# Patient Record
Sex: Female | Born: 1950 | Race: White | Hispanic: No | Smoking: Current some day smoker
Health system: Southern US, Community
[De-identification: ages and names within clinical notes are randomized; demographics above are authoritative.]

## PROBLEM LIST (undated history)

## (undated) DIAGNOSIS — M1990 Unspecified osteoarthritis, unspecified site: Secondary | ICD-10-CM

## (undated) DIAGNOSIS — Z800 Family history of malignant neoplasm of digestive organs: Secondary | ICD-10-CM

## (undated) DIAGNOSIS — F32A Depression, unspecified: Secondary | ICD-10-CM

## (undated) DIAGNOSIS — K219 Gastro-esophageal reflux disease without esophagitis: Secondary | ICD-10-CM

## (undated) DIAGNOSIS — R112 Nausea with vomiting, unspecified: Secondary | ICD-10-CM

## (undated) DIAGNOSIS — I10 Essential (primary) hypertension: Secondary | ICD-10-CM

## (undated) DIAGNOSIS — E785 Hyperlipidemia, unspecified: Secondary | ICD-10-CM

## (undated) DIAGNOSIS — Z808 Family history of malignant neoplasm of other organs or systems: Secondary | ICD-10-CM

## (undated) DIAGNOSIS — C439 Malignant melanoma of skin, unspecified: Secondary | ICD-10-CM

## (undated) DIAGNOSIS — F419 Anxiety disorder, unspecified: Secondary | ICD-10-CM

## (undated) DIAGNOSIS — J449 Chronic obstructive pulmonary disease, unspecified: Secondary | ICD-10-CM

## (undated) DIAGNOSIS — Z8489 Family history of other specified conditions: Secondary | ICD-10-CM

## (undated) DIAGNOSIS — E119 Type 2 diabetes mellitus without complications: Secondary | ICD-10-CM

## (undated) DIAGNOSIS — T8859XA Other complications of anesthesia, initial encounter: Secondary | ICD-10-CM

## (undated) DIAGNOSIS — F329 Major depressive disorder, single episode, unspecified: Secondary | ICD-10-CM

## (undated) DIAGNOSIS — J45909 Unspecified asthma, uncomplicated: Secondary | ICD-10-CM

## (undated) DIAGNOSIS — Z923 Personal history of irradiation: Secondary | ICD-10-CM

## (undated) DIAGNOSIS — R569 Unspecified convulsions: Secondary | ICD-10-CM

## (undated) DIAGNOSIS — Z9889 Other specified postprocedural states: Secondary | ICD-10-CM

## (undated) HISTORY — DX: Depression, unspecified: F32.A

## (undated) HISTORY — DX: Major depressive disorder, single episode, unspecified: F32.9

## (undated) HISTORY — DX: Hyperlipidemia, unspecified: E78.5

## (undated) HISTORY — DX: Malignant melanoma of skin, unspecified: C43.9

## (undated) HISTORY — PX: TUBAL LIGATION: SHX77

## (undated) HISTORY — DX: Essential (primary) hypertension: I10

## (undated) HISTORY — DX: Family history of malignant neoplasm of digestive organs: Z80.0

## (undated) HISTORY — DX: Anxiety disorder, unspecified: F41.9

## (undated) HISTORY — DX: Family history of malignant neoplasm of other organs or systems: Z80.8

---

## 2001-12-18 ENCOUNTER — Encounter: Payer: Self-pay | Admitting: Internal Medicine

## 2001-12-18 ENCOUNTER — Ambulatory Visit (HOSPITAL_COMMUNITY): Admission: RE | Admit: 2001-12-18 | Discharge: 2001-12-18 | Payer: Self-pay | Admitting: Internal Medicine

## 2007-08-18 ENCOUNTER — Ambulatory Visit (HOSPITAL_COMMUNITY): Admission: RE | Admit: 2007-08-18 | Discharge: 2007-08-18 | Payer: Self-pay | Admitting: Internal Medicine

## 2007-10-04 ENCOUNTER — Ambulatory Visit (HOSPITAL_COMMUNITY): Admission: RE | Admit: 2007-10-04 | Discharge: 2007-10-04 | Payer: Self-pay | Admitting: General Surgery

## 2007-10-09 ENCOUNTER — Encounter: Payer: Self-pay | Admitting: Obstetrics and Gynecology

## 2007-10-09 ENCOUNTER — Ambulatory Visit (HOSPITAL_COMMUNITY): Admission: RE | Admit: 2007-10-09 | Discharge: 2007-10-09 | Payer: Self-pay | Admitting: Obstetrics and Gynecology

## 2010-06-02 NOTE — Op Note (Signed)
NAMECHARITIE, Melissa Weaver              ACCOUNT NO.:  [id number]   MEDICAL RECORD NO.:  [id number]          PATIENT TYPE:  AMB   LOCATION:  DAY                           FACILITY:  [facility]   PHYSICIAN:  Tilda Burrow, M.D. DATE OF BIRTH:  [birth year]   DATE OF PROCEDURE:  10/09/2007  DATE OF DISCHARGE:                               OPERATIVE REPORT   PREOPERATIVE DIAGNOSES:  Vulvar carcinoma in situ, cervical polyp.   POSTOPERATIVE DIAGNOSES:  Vulvar carcinoma in situ, cervical polyp.   PROCEDURES:  Left partial vulvectomy, cervical polypectomy.   SURGEON:  Tilda Burrow, MD   ASSISTANTAmie Critchley, CST.   ANESTHESIA:  General with LMA.   COMPLICATIONS:  Some throat irritation as the patient was recently  recovering from an upper respiratory infection, otherwise uneventful  postop status.   DETAILS OF THE PROCEDURE:  The patient was taken to the operating room,  prepped and draped in standard fashion after time-out completed with the  lesion on the left labia majora easily demarcated.  The specimen was  infiltrated subcutaneously with dilute Marcaine with epinephrine  solution and then #15 blade used to circumscribe the specimen leaving  about a 5-mm margin of visually normal tissue all the way around the  lesion which encompassed the bulk of the left labia majora and the inner  aspects of the left labia minora.  The specimen was excised intact in  one block and tagged 12 o'clock for histologic orientation.  The subcu  tissues were inspected and hemostasis achieved with point cautery as  necessary.  It was necessary to undermine the skin edges on the side  next to the vagina as well as the side nearest the left thigh in order  to lead to a tension-free tissue edge approximation which was  accomplished with a series of interrupted 3-0 chromic sutures.  Skin  edges pulled together nicely and hemostasis was considered adequate.  The cervix was then visualized with speculum in place  and the small  polyp growing on the posterior lip of the cervix was trimmed and sent  off as a separate specimen.  The patient was allowed  to waken during the cleansing of the perineum after the procedure to  remove Betadine.  There was some oozing.  Ice pack was applied.  The  patient went to recovery room in good condition, will be allowed to go  home today, given prescription for Vicodin x25 tablets p.r.n.  discomfort.  Ice pack and routine wound care will be advised.      Tilda Burrow, M.D.  Electronically Signed     JVF/MEDQ  D:  10/09/2007  T:  10/10/2007  Job:  [id number]   cc:   Madelin Rear. Sherwood Gambler, MD  Fax: [phone]   Mertha Finders., M.D.  Fax: [phone]

## 2010-06-02 NOTE — H&P (Signed)
Melissa Weaver, DESHMUKH              ACCOUNT NO.:  [id number]   MEDICAL RECORD NO.:  [id number]          PATIENT TYPE:  AMB   LOCATION:  DAY                           FACILITY:  [facility]   PHYSICIAN:  Dalia Heading, M.D.  DATE OF BIRTH:  1950-05-29   DATE OF ADMISSION:  DATE OF DISCHARGE:  LH                              HISTORY & PHYSICAL   CHIEF COMPLAINT:  Need for screening colonoscopy.   HISTORY OF PRESENT ILLNESS:  The patient is a 60 year old white female  who is referred for endoscopic evaluation for which she needs  colonoscopy for screening purposes.  No abdominal pain, weight loss,  nausea, vomiting, diarrhea, constipation, melena, or hematochezia have  been noted.  She has never had a colonoscopy.  There is no family  history of colon carcinoma.   PAST MEDICAL HISTORY:  Hypertension.   PAST SURGICAL HISTORY:  GYN surgery.   CURRENT MEDICATIONS:  Enalapril, calcium supplements, fish oil.   ALLERGIES:  No known drug allergies.   REVIEW OF SYSTEMS:  The patient smokes a pack of cigarettes a day.  She  denies any significant alcohol use.  She denies any other cardiac or  pulmonary difficulties or bleeding disorders.   PHYSICAL EXAMINATION:  GENERAL :  The patient is a well-developed, well-  nourished white female in no acute distress.  LUNGS:  Clear to auscultation with equal breath sounds bilaterally.  HEART:  Regular rate and rhythm with no S3, S4, or murmurs.  ABDOMEN:  Soft, nontender, nondistended.  No hepatosplenomegaly or  masses noted.  RECTAL:  Deferred due to the procedure.   IMPRESSION:  Need for screening colonoscopy.   PLAN:  The patient is scheduled for a colonoscopy on October 04, 2007.  The risks and benefits of the procedure including bleeding and  perforation were fully explained to the patient, gave informed consent.      Dalia Heading, M.D.     MAJ/MEDQ  D:  09/19/2007  T:  09/20/2007  Job:  [id number]   cc:   Madelin Rear. Melissa Gambler, MD  Fax:  [phone]

## 2010-06-02 NOTE — H&P (Signed)
Melissa Weaver, Melissa Weaver              ACCOUNT NO.:  [id number]   MEDICAL RECORD NO.:  [id number]          PATIENT TYPE:  AMB   LOCATION:  DAY                           FACILITY:  [facility]   PHYSICIAN:  Dalia Heading, M.D.  DATE OF BIRTH:  1950-03-06   DATE OF ADMISSION:  DATE OF DISCHARGE:  LH                              HISTORY & PHYSICAL   CHIEF COMPLAINT:  Need for screening colonoscopy.   HISTORY OF PRESENT ILLNESS:  The patient is a 60 year old white female  who is referred for endoscopic evaluation for which she needs  colonoscopy for screening purposes.  No abdominal pain, weight loss,  nausea, vomiting, diarrhea, constipation, melena, or hematochezia have  been noted.  She has never had a colonoscopy.  There is no family  history of colon carcinoma.   PAST MEDICAL HISTORY:  Hypertension.   PAST SURGICAL HISTORY:  GYN surgery.   CURRENT MEDICATIONS:  Enalapril, calcium supplements, fish oil.   ALLERGIES:  No known drug allergies.   REVIEW OF SYSTEMS:  The patient smokes a pack of cigarettes a day.  She  denies any significant alcohol use.  She denies any other cardiac or  pulmonary difficulties or bleeding disorders.   PHYSICAL EXAMINATION:  GENERAL :  The patient is a well-developed, well-  nourished white female in no acute distress.  LUNGS:  Clear to auscultation with equal breath sounds bilaterally.  HEART:  Regular rate and rhythm with no S3, S4, or murmurs.  ABDOMEN:  Soft, nontender, nondistended.  No hepatosplenomegaly or  masses noted.  RECTAL:  Deferred due to the procedure.   IMPRESSION:  Need for screening colonoscopy.   PLAN:  The patient is scheduled for a colonoscopy on October 04, 2007.  The risks and benefits of the procedure including bleeding and  perforation were fully explained to the patient, gave informed consent.      Dalia Heading, M.D.  Electronically Signed     MAJ/MEDQ  D:  09/19/2007  T:  09/20/2007  Job:  [id number]   cc:    Madelin Rear. Sherwood Gambler, MD  Fax: [phone]

## 2010-06-02 NOTE — H&P (Signed)
NAME:  Melissa Weaver, SCHLICHTING NO.:  [id number]   MEDICAL RECORD NO.:  [id number]          PATIENT TYPE:  AMB   LOCATION:  DAY                           FACILITY:  [facility]   PHYSICIAN:  Tilda Burrow, M.D. 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   HISTORY OF PRESENT ILLNESS:  This 60 year old female was referred to our  office, courtesy of Dr. Nita Sells and Dr. Phillips Odor, for excision of a  large 4 x 3-cm lesion on the left labia majora and minora.  She was seen  and referred to Dr. Margo Aye whose biopsies at the most abnormal portion of  the labia majora shows vulvar carcinoma in situ with well-demarcated  margins.  There is no inguinal adenopathy.  There has been no bleeding.  Her last Pap was normal in August 2009, previous Pap in 2001 at [facility] where she receives her general medical care.   MEDICAL HISTORY:  Essentially benign.   SURGICAL HISTORY:  Tubal ligation in 1978 or 1979 at [place].   MEDICATIONS:  1. Alendronate sodium 70 mg tablet once weekly.  2. Aldara topical cream applied to the lesion prior to biopsy.  3. Calcium supplements.  4. Enalapril maleate 5 mg taken daily.   PHYSICAL EXAMINATION:  VITAL SIGNS:  Height 5 feet and 3-1/2 inches and  weight 185.  HEENT:  Pupils are equal, round, and reactive to light.  Extraocular  movements are intact.  NECK:  Supple.  CHEST:  Clear to auscultation.  ABDOMEN:  As stated, nontender without masses.  CARDIAC:  Regular rate and rhythm without murmurs.  GU:  External genitalia has a distinctly abnormal area on the left labia  majora, requires excisional biopsy.  Given the relaxed tissue around it,  it is hoped that a primary closure can be possible.  We will remove a  portion of underlying fatty tissue as a portion of the en bloc  dissection.  The inguinal areas were palpated bilaterally and there is  no adenopathy either side.   REVIEW OF SYSTEMS:  Negative for weight loss or weight gain.   HABITS:  She smokes one pack per day.  Alcohol is denied.  She is  married and in a stable relationship without any noted problems.   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   PLAN:  Primary en bloc removal by partial vulvectomy on October 09, 2007.      Tilda Burrow, M.D.  Electronically Signed     JVF/MEDQ  D:  10/08/2007  T:  10/09/2007  Job:  [id number]   cc:   [facility]   Madelin Rear. Sherwood Gambler, MD  Fax: [phone]   Mertha Finders., M.D.  Fax: [phone]

## 2010-10-19 LAB — CBC
HCT: 41.8
Hemoglobin: 14.1
MCHC: 33.7
MCV: 85.9
Platelets: 296
RBC: 4.87
RDW: 13.6
WBC: 11.7 — ABNORMAL HIGH

## 2010-10-19 LAB — BASIC METABOLIC PANEL
BUN: 6
CO2: 26
Calcium: 9.8
Chloride: 106
Creatinine, Ser: 0.62
GFR calc Af Amer: >60
GFR calc non Af Amer: >60
Glucose, Bld: 106 — ABNORMAL HIGH
Potassium: 3.8
Sodium: 142

## 2016-07-08 DIAGNOSIS — F419 Anxiety disorder, unspecified: Secondary | ICD-10-CM | POA: Diagnosis not present

## 2016-07-08 DIAGNOSIS — N951 Menopausal and female climacteric states: Secondary | ICD-10-CM | POA: Diagnosis not present

## 2016-07-08 DIAGNOSIS — Z720 Tobacco use: Secondary | ICD-10-CM | POA: Diagnosis not present

## 2016-07-15 DIAGNOSIS — I10 Essential (primary) hypertension: Secondary | ICD-10-CM | POA: Diagnosis not present

## 2016-07-15 DIAGNOSIS — Z1159 Encounter for screening for other viral diseases: Secondary | ICD-10-CM | POA: Diagnosis not present

## 2016-07-22 DIAGNOSIS — R030 Elevated blood-pressure reading, without diagnosis of hypertension: Secondary | ICD-10-CM | POA: Diagnosis not present

## 2016-07-22 DIAGNOSIS — Z23 Encounter for immunization: Secondary | ICD-10-CM | POA: Diagnosis not present

## 2016-07-22 DIAGNOSIS — Z0000 Encounter for general adult medical examination without abnormal findings: Secondary | ICD-10-CM | POA: Diagnosis not present

## 2016-07-22 DIAGNOSIS — F419 Anxiety disorder, unspecified: Secondary | ICD-10-CM | POA: Diagnosis not present

## 2016-07-22 DIAGNOSIS — Z01419 Encounter for gynecological examination (general) (routine) without abnormal findings: Secondary | ICD-10-CM | POA: Diagnosis not present

## 2016-07-22 DIAGNOSIS — E782 Mixed hyperlipidemia: Secondary | ICD-10-CM | POA: Diagnosis not present

## 2016-07-22 DIAGNOSIS — F17200 Nicotine dependence, unspecified, uncomplicated: Secondary | ICD-10-CM | POA: Diagnosis not present

## 2016-08-05 DIAGNOSIS — I10 Essential (primary) hypertension: Secondary | ICD-10-CM | POA: Diagnosis not present

## 2016-08-05 DIAGNOSIS — Z6835 Body mass index (BMI) 35.0-35.9, adult: Secondary | ICD-10-CM | POA: Diagnosis not present

## 2016-08-05 DIAGNOSIS — F419 Anxiety disorder, unspecified: Secondary | ICD-10-CM | POA: Diagnosis not present

## 2016-08-17 DIAGNOSIS — F419 Anxiety disorder, unspecified: Secondary | ICD-10-CM | POA: Diagnosis not present

## 2016-08-17 DIAGNOSIS — I10 Essential (primary) hypertension: Secondary | ICD-10-CM | POA: Diagnosis not present

## 2016-08-17 DIAGNOSIS — Z23 Encounter for immunization: Secondary | ICD-10-CM | POA: Diagnosis not present

## 2016-09-02 DIAGNOSIS — F419 Anxiety disorder, unspecified: Secondary | ICD-10-CM | POA: Diagnosis not present

## 2016-09-02 DIAGNOSIS — I10 Essential (primary) hypertension: Secondary | ICD-10-CM | POA: Diagnosis not present

## 2016-09-02 DIAGNOSIS — Z6834 Body mass index (BMI) 34.0-34.9, adult: Secondary | ICD-10-CM | POA: Diagnosis not present

## 2016-10-07 DIAGNOSIS — F419 Anxiety disorder, unspecified: Secondary | ICD-10-CM | POA: Diagnosis not present

## 2016-10-07 DIAGNOSIS — Z6835 Body mass index (BMI) 35.0-35.9, adult: Secondary | ICD-10-CM | POA: Diagnosis not present

## 2016-10-07 DIAGNOSIS — I10 Essential (primary) hypertension: Secondary | ICD-10-CM | POA: Diagnosis not present

## 2016-10-07 DIAGNOSIS — Z23 Encounter for immunization: Secondary | ICD-10-CM | POA: Diagnosis not present

## 2017-02-04 DIAGNOSIS — E782 Mixed hyperlipidemia: Secondary | ICD-10-CM | POA: Diagnosis not present

## 2017-02-04 DIAGNOSIS — I10 Essential (primary) hypertension: Secondary | ICD-10-CM | POA: Diagnosis not present

## 2017-02-14 DIAGNOSIS — E875 Hyperkalemia: Secondary | ICD-10-CM | POA: Diagnosis not present

## 2017-02-14 DIAGNOSIS — R7301 Impaired fasting glucose: Secondary | ICD-10-CM | POA: Diagnosis not present

## 2017-02-14 DIAGNOSIS — I10 Essential (primary) hypertension: Secondary | ICD-10-CM | POA: Diagnosis not present

## 2017-02-14 DIAGNOSIS — F17210 Nicotine dependence, cigarettes, uncomplicated: Secondary | ICD-10-CM | POA: Diagnosis not present

## 2017-02-14 DIAGNOSIS — F411 Generalized anxiety disorder: Secondary | ICD-10-CM | POA: Diagnosis not present

## 2017-02-15 ENCOUNTER — Other Ambulatory Visit (HOSPITAL_COMMUNITY): Payer: Self-pay | Admitting: Internal Medicine

## 2017-02-15 DIAGNOSIS — Z1231 Encounter for screening mammogram for malignant neoplasm of breast: Secondary | ICD-10-CM

## 2017-02-15 DIAGNOSIS — Z780 Asymptomatic menopausal state: Secondary | ICD-10-CM

## 2017-02-24 ENCOUNTER — Ambulatory Visit (HOSPITAL_COMMUNITY)
Admission: RE | Admit: 2017-02-24 | Discharge: 2017-02-24 | Disposition: A | Discharge status: Home / Self Care | Payer: PPO | Source: Ambulatory Visit | Attending: Internal Medicine | Admitting: Internal Medicine

## 2017-02-24 ENCOUNTER — Encounter (HOSPITAL_COMMUNITY): Payer: Self-pay | Admitting: Radiology

## 2017-02-24 DIAGNOSIS — R928 Other abnormal and inconclusive findings on diagnostic imaging of breast: Secondary | ICD-10-CM | POA: Diagnosis not present

## 2017-02-24 DIAGNOSIS — Z780 Asymptomatic menopausal state: Secondary | ICD-10-CM | POA: Diagnosis not present

## 2017-02-24 DIAGNOSIS — Z1231 Encounter for screening mammogram for malignant neoplasm of breast: Secondary | ICD-10-CM | POA: Insufficient documentation

## 2017-02-24 DIAGNOSIS — M818 Other osteoporosis without current pathological fracture: Secondary | ICD-10-CM | POA: Diagnosis not present

## 2017-02-24 DIAGNOSIS — M810 Age-related osteoporosis without current pathological fracture: Secondary | ICD-10-CM | POA: Diagnosis not present

## 2017-03-07 ENCOUNTER — Other Ambulatory Visit (HOSPITAL_COMMUNITY): Payer: Self-pay | Admitting: Internal Medicine

## 2017-03-07 DIAGNOSIS — R928 Other abnormal and inconclusive findings on diagnostic imaging of breast: Secondary | ICD-10-CM

## 2017-03-08 DIAGNOSIS — Z712 Person consulting for explanation of examination or test findings: Secondary | ICD-10-CM | POA: Diagnosis not present

## 2017-03-08 DIAGNOSIS — Z6834 Body mass index (BMI) 34.0-34.9, adult: Secondary | ICD-10-CM | POA: Diagnosis not present

## 2017-03-08 DIAGNOSIS — M810 Age-related osteoporosis without current pathological fracture: Secondary | ICD-10-CM | POA: Diagnosis not present

## 2017-03-08 DIAGNOSIS — K1379 Other lesions of oral mucosa: Secondary | ICD-10-CM | POA: Diagnosis not present

## 2017-03-15 ENCOUNTER — Ambulatory Visit (HOSPITAL_COMMUNITY)
Admission: RE | Admit: 2017-03-15 | Discharge: 2017-03-15 | Disposition: A | Discharge status: Home / Self Care | Payer: PPO | Source: Ambulatory Visit | Attending: Internal Medicine | Admitting: Internal Medicine

## 2017-03-15 ENCOUNTER — Ambulatory Visit (HOSPITAL_COMMUNITY): Payer: PPO

## 2017-03-15 DIAGNOSIS — N6314 Unspecified lump in the right breast, lower inner quadrant: Secondary | ICD-10-CM | POA: Diagnosis not present

## 2017-03-15 DIAGNOSIS — N6310 Unspecified lump in the right breast, unspecified quadrant: Secondary | ICD-10-CM | POA: Diagnosis not present

## 2017-03-15 DIAGNOSIS — R921 Mammographic calcification found on diagnostic imaging of breast: Secondary | ICD-10-CM | POA: Insufficient documentation

## 2017-03-15 DIAGNOSIS — R928 Other abnormal and inconclusive findings on diagnostic imaging of breast: Secondary | ICD-10-CM | POA: Insufficient documentation

## 2017-03-17 ENCOUNTER — Other Ambulatory Visit: Payer: Self-pay | Admitting: Internal Medicine

## 2017-03-17 ENCOUNTER — Encounter (HOSPITAL_COMMUNITY)
Admission: RE | Admit: 2017-03-17 | Discharge: 2017-03-17 | Disposition: A | Discharge status: Home / Self Care | Payer: PPO | Source: Ambulatory Visit | Attending: Internal Medicine | Admitting: Internal Medicine

## 2017-03-17 DIAGNOSIS — M810 Age-related osteoporosis without current pathological fracture: Secondary | ICD-10-CM | POA: Diagnosis not present

## 2017-03-17 MED ORDER — DENOSUMAB 60 MG/ML ~~LOC~~ SOLN
60.0000 mg | Freq: Once | SUBCUTANEOUS | Status: AC
  Administered 2017-03-17: 60 mg via SUBCUTANEOUS
  Filled 2017-03-17: qty 1

## 2017-03-17 NOTE — Discharge Instructions (Signed)
Denosumab injection °What is this medicine? °DENOSUMAB (den oh sue mab) slows bone breakdown. Prolia is used to treat osteoporosis in women after menopause and in men. Xgeva is used to treat a high calcium level due to cancer and to prevent bone fractures and other bone problems caused by multiple myeloma or cancer bone metastases. Xgeva is also used to treat giant cell tumor of the bone. °This medicine may be used for other purposes; ask your health care provider or pharmacist if you have questions. °COMMON BRAND NAME(S): Prolia, XGEVA °What should I tell my health care provider before I take this medicine? °They need to know if you have any of these conditions: °-dental disease °-having surgery or tooth extraction °-infection °-kidney disease °-low levels of calcium or Vitamin D in the blood °-malnutrition °-on hemodialysis °-skin conditions or sensitivity °-thyroid or parathyroid disease °-an unusual reaction to denosumab, other medicines, foods, dyes, or preservatives °-pregnant or trying to get pregnant °-breast-feeding °How should I use this medicine? °This medicine is for injection under the skin. It is given by a health care professional in a hospital or clinic setting. °If you are getting Prolia, a special MedGuide will be given to you by the pharmacist with each prescription and refill. Be sure to read this information carefully each time. °For Prolia, talk to your pediatrician regarding the use of this medicine in children. Special care may be needed. For Xgeva, talk to your pediatrician regarding the use of this medicine in children. While this drug may be prescribed for children as young as 13 years for selected conditions, precautions do apply. °Overdosage: If you think you have taken too much of this medicine contact a poison control center or emergency room at once. °NOTE: This medicine is only for you. Do not share this medicine with others. °What if I miss a dose? °It is important not to miss your  dose. Call your doctor or health care professional if you are unable to keep an appointment. °What may interact with this medicine? °Do not take this medicine with any of the following medications: °-other medicines containing denosumab °This medicine may also interact with the following medications: °-medicines that lower your chance of fighting infection °-steroid medicines like prednisone or cortisone °This list may not describe all possible interactions. Give your health care provider a list of all the medicines, herbs, non-prescription drugs, or dietary supplements you use. Also tell them if you smoke, drink alcohol, or use illegal drugs. Some items may interact with your medicine. °What should I watch for while using this medicine? °Visit your doctor or health care professional for regular checks on your progress. Your doctor or health care professional may order blood tests and other tests to see how you are doing. °Call your doctor or health care professional for advice if you get a fever, chills or sore throat, or other symptoms of a cold or flu. Do not treat yourself. This drug may decrease your body's ability to fight infection. Try to avoid being around people who are sick. °You should make sure you get enough calcium and vitamin D while you are taking this medicine, unless your doctor tells you not to. Discuss the foods you eat and the vitamins you take with your health care professional. °See your dentist regularly. Brush and floss your teeth as directed. Before you have any dental work done, tell your dentist you are receiving this medicine. °Do not become pregnant while taking this medicine or for 5 months after stopping   it. Talk with your doctor or health care professional about your birth control options while taking this medicine. Women should inform their doctor if they wish to become pregnant or think they might be pregnant. There is a potential for serious side effects to an unborn child. Talk  to your health care professional or pharmacist for more information. What side effects may I notice from receiving this medicine? Side effects that you should report to your doctor or health care professional as soon as possible: -allergic reactions like skin rash, itching or hives, swelling of the face, lips, or tongue -bone pain -breathing problems -dizziness -jaw pain, especially after dental work -redness, blistering, peeling of the skin -signs and symptoms of infection like fever or chills; cough; sore throat; pain or trouble passing urine -signs of low calcium like fast heartbeat, muscle cramps or muscle pain; pain, tingling, numbness in the hands or feet; seizures -unusual bleeding or bruising -unusually weak or tired Side effects that usually do not require medical attention (report to your doctor or health care professional if they continue or are bothersome): -constipation -diarrhea -headache -joint pain -loss of appetite -muscle pain -runny nose -tiredness -upset stomach This list may not describe all possible side effects. Call your doctor for medical advice about side effects. You may report side effects to FDA at 1-800-FDA-1088. Where should I keep my medicine? This medicine is only given in a clinic, doctor's office, or other health care setting and will not be stored at home. NOTE: This sheet is a summary. It may not cover all possible information. If you have questions about this medicine, talk to your doctor, pharmacist, or health care provider.  2018 Elsevier/Gold Standard (2016-01-27 19:17:21)

## 2017-03-23 ENCOUNTER — Other Ambulatory Visit: Payer: Self-pay | Admitting: Internal Medicine

## 2017-03-23 DIAGNOSIS — R921 Mammographic calcification found on diagnostic imaging of breast: Secondary | ICD-10-CM

## 2017-03-31 ENCOUNTER — Ambulatory Visit
Admission: RE | Admit: 2017-03-31 | Discharge: 2017-03-31 | Disposition: A | Discharge status: Home / Self Care | Payer: PPO | Source: Ambulatory Visit | Attending: Internal Medicine | Admitting: Internal Medicine

## 2017-03-31 DIAGNOSIS — R921 Mammographic calcification found on diagnostic imaging of breast: Secondary | ICD-10-CM

## 2017-03-31 DIAGNOSIS — D0512 Intraductal carcinoma in situ of left breast: Secondary | ICD-10-CM | POA: Diagnosis not present

## 2017-04-07 ENCOUNTER — Encounter: Payer: Self-pay | Admitting: General Surgery

## 2017-04-07 ENCOUNTER — Ambulatory Visit (INDEPENDENT_AMBULATORY_CARE_PROVIDER_SITE_OTHER): Payer: PPO | Admitting: General Surgery

## 2017-04-07 VITALS — BP 137/76 | HR 94 | Temp 99.5°F | Resp 18 | Ht 63.0 in | Wt 200.0 lb

## 2017-04-07 DIAGNOSIS — F32A Depression, unspecified: Secondary | ICD-10-CM | POA: Insufficient documentation

## 2017-04-07 DIAGNOSIS — E785 Hyperlipidemia, unspecified: Secondary | ICD-10-CM | POA: Insufficient documentation

## 2017-04-07 DIAGNOSIS — D0512 Intraductal carcinoma in situ of left breast: Secondary | ICD-10-CM | POA: Diagnosis not present

## 2017-04-07 DIAGNOSIS — I10 Essential (primary) hypertension: Secondary | ICD-10-CM | POA: Insufficient documentation

## 2017-04-07 DIAGNOSIS — F411 Generalized anxiety disorder: Secondary | ICD-10-CM | POA: Insufficient documentation

## 2017-04-07 DIAGNOSIS — F329 Major depressive disorder, single episode, unspecified: Secondary | ICD-10-CM | POA: Insufficient documentation

## 2017-04-07 NOTE — Patient Instructions (Signed)
Ductal Carcinoma in Situ Ductal carcinoma in situ is a growth of abnormal cells in the breast. The abnormal cells are located in the tubes that carry milk to the nipple (milk ducts) and have not spread to other areas. Ductal carcinoma in situ is the earliest form of breast cancer. What are the causes? The exact cause of ductal carcinoma in situ is not known. What increases the risk? Risk factors for ductal carcinoma in situ include:  Age. Your risk increases as you get older.  Family history of breast cancer.  Having prior radiation treatments to your breasts or chest area.  Being overweight.  Using or having used hormones, such as estrogen.  Prior history of: ? Breast cancer. ? Noncancerous breast conditions. ? Dense breasts.  Drinking more than one alcoholic beverage per day.  Starting menstruation before the age of 70.  Never having given birth.  Giving birth to your first child when you were over the age of 48.  Not breastfeeding, if you have given birth.  Not exercising consistently.  What are the signs or symptoms? Ductal carcinoma in situ does not cause any symptoms. How is this diagnosed? Ductal carcinoma in situ is usually discovered during a routine X-ray study of the breasts (mammogram). To diagnose the condition, your health care provider will take a tissue sample from your breast so it can be examined under a microscope (breast biopsy). How is this treated? Ductal carcinoma in situ treatment may include:  A lumpectomy. This is the removal of the area of abnormal cells, along with a ring of normal tissue.This may also be called breast-conserving surgery.  A simple mastectomy. This is the removal of breast tissue, the nipple, and the circle of colored tissue around the nipple (areola). Sometimes, one or more lymph nodes from under the arm are also removed.  Preventative mastectomy. This is the removal of both breasts. This is usually done only if you have a  very high risk of developing breast cancer.  Radiation. This is a treatment that uses X-rays to kill cancer cells.  Medicines to keep the cancer from spreading.  Follow these instructions at home:  Take medicines only as directed by your health care provider.  Keep all follow-up visits as directed by your health care provider. This is important.  Limit alcohol intake to no more than 1 drink per day for nonpregnant women. One drink equals 12 ounces of beer, 5 ounces of wine, or 1 ounces of hard liquor. Contact a health care provider if: You have a fever. Get help right away if: You have difficulty breathing. This information is not intended to replace advice given to you by your health care provider. Make sure you discuss any questions you have with your health care provider. Document Released: 08/01/2013 Document Revised: 06/12/2015 Document Reviewed: 06/07/2013 Elsevier Interactive Patient Education  2018 [place] A lumpectomy, sometimes called a partial mastectomy, is surgery to remove a cancerous tumor or mass (the lump) from a breast. It is a form of "breast conserving" or "breast preservation" surgery. This means that the cancerous tissue is removed but the breast remains intact. During a lumpectomy, the portion of the breast that contains the tumor is removed. Some normal tissue around the lump may be taken out to make sure that all of the tumor has been removed. Lymph nodes under your arm may also be removed and tested to find out if the cancer has spread. Tell a health care provider about:  Any  allergies you have.  All medicines you are taking, including vitamins, herbs, eye drops, creams, and over-the-counter medicines.  Any problems you or family members have had with anesthetic medicines.  Any blood disorders you have.  Any surgeries you have had.  Any medical conditions you have.  Whether you are pregnant or may be pregnant. What are the  risks? Generally, this is a safe procedure. However, problems may occur, including:  Bleeding.  Infection.  Pain, swelling, weakness, or numbness in the arm on the side of your surgery.  Temporary swelling.  Change in the shape of the breast, particularly if a large portion is removed.  Scar tissue that forms at the surgical site and feels hard to the touch.  What happens before the procedure? Staying hydrated Follow instructions from your health care provider about hydration, which may include:  Up to 2 hours before the procedure - you may continue to drink clear liquids, such as water, clear fruit juice, black coffee, and plain tea.  Eating and drinking restrictions  Follow instructions from your health care provider about eating and drinking, which may include: ? 8 hours before the procedure - stop eating heavy meals or foods such as meat, fried foods, or fatty foods. ? 6 hours before the procedure - stop eating light meals or foods, such as toast or cereal. ? 6 hours before the procedure - stop drinking milk or drinks that contain milk. ? 2 hours before the procedure - stop drinking clear liquids. Medicines  Ask your health care provider about: ? Changing or stopping your regular medicines. This is especially important if you are taking diabetes medicines or blood thinners. ? Taking medicines such as aspirin and ibuprofen. These medicines can thin your blood. Do not take these medicines before your procedure if your health care provider instructs you not to.  You may be given antibiotic medicine to help prevent infection. General instructions  Ask your health care provider how your surgical site will be marked or identified.  You may be screened for extra fluid around the lymph nodes (lymphedema).  Plan to have someone take you home from the hospital or clinic.  On the day of surgery, your health care provider will use a mammogram or ultrasound to locate and mark the  tumor in your breast. These markings on your breast will show where the incision will be made. What happens during the procedure?  To lower your risk of infection: ? Your health care team will wash or sanitize their hands. ? Your skin will be washed with soap.  An IV tube will be put into one of your veins.  You will be given one or more of the following: ? A medicine to help you relax (sedative). ? A medicine to numb the area (local anesthetic). ? A medicine to make you fall asleep (general anesthetic).  Your health care provider will use a kind of electric scalpel that uses heat to minimize bleeding (electrocautery knife). A curved incision that follows the natural curve of your breast will be made. This type of incision will allow for minimal scarring and better healing.  The tumor will be removed along with some of the surrounding tissue. This will be sent to the lab for testing. Your health care provider may also remove lymph nodes at this time if needed.  A small drain tube may be inserted into your breast area or armpit to collect fluid that may build up after surgery. This tube will be connected  to a suction bulb on the outside of your body to remove the fluid.  The incision will be closed with stitches (sutures).  A bandage (dressing) may be placed over the incision. The procedure may vary among health care providers and hospitals. What happens after the procedure?  Your blood pressure, heart rate, breathing rate, and blood oxygen level will be monitored until the medicines you were given have worn off.  You will be given medicine for pain.  You may have a drain tube in place for 2-3 days to prevent a collection of blood (hematoma) from developing in the breast. You will be given instructions about caring for the drain before you go home.  A pressure bandage may be applied for 1-2 days to prevent bleeding or swelling. Your pressure bandage may look like a thick piece of  fabric or an elastic wrap. Ask your health care provider how to care for your bandage at home.  You may be given a tight sleeve to wear over your arm on the side of your surgery. You should wear this sleeve as told by your health care provider.  Do not drive for 24 hours if you were given a sedative. This information is not intended to replace advice given to you by your health care provider. Make sure you discuss any questions you have with your health care provider. Document Released: 02/15/2006 Document Revised: 09/18/2015 Document Reviewed: 09/17/2015 Elsevier Interactive Patient Education  2018 [employer].

## 2017-04-07 NOTE — Progress Notes (Signed)
[facility] History and Physical  Reason for Referral: DCIS left breast  Referring Physician:  Dr. Amelia Jo Melissa Weaver is a 67 y.o. female.  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 She had not had a mammogram in over 10 years, and decided that she would get one.  She does not report any breast pain, masses, lumps, nipple discharge, or skin changes. She has never had any breast biopsies or abnormal mammograms but was told she had some fibrocystic changes in her youth.    She had menarche at age 35, and her first pregnancy at age 68. She is G1P1. She did not breastfeed her children.  She has no history of any family breast cancer.  She underwent menopause at 56.  She has never had any previous biopsies or concerning areas on mammogram.  She has not had any chest radiation.  No family history of any breast cancers, but she does have a significant family history of pancreatic cancer.    Past Medical History:  Diagnosis Date  . Anxiety   . Depression   . HLD (hyperlipidemia)   . HTN (hypertension)    History reviewed. No pertinent surgical history.  Family History  Problem Relation Age of Onset  . Pancreatic cancer Mother   . Atrial fibrillation Father   . COPD Father   . Hypertension Father   . High Cholesterol Father   . Pancreatic cancer Maternal Aunt   . Pancreatic cancer Maternal Uncle     Social History   Tobacco Use  . Smoking status: Current Every Day Smoker    Types: Cigarettes  . Smokeless tobacco: Never Used  Substance Use Topics  . Alcohol use: Never    Frequency: Never  . Drug use: Never    Medications: I have reviewed the patient's current medications. Prior to Admission:  (Not in a hospital admission) Allergies as of 04/07/2017   No Known Allergies     Medication List        Accurate as of 04/07/17  4:01 PM. Always use your most recent med list.            ALPRAZolam 0.25 MG tablet Commonly known as:  XANAX   atorvastatin 20 MG tablet Commonly known as:  LIPITOR   denosumab 60 MG/ML Soln injection Commonly known as:  PROLIA Inject 60 mg into the skin every 6 (six) months. Administer in upper arm, thigh, or abdomen   escitalopram 10 MG tablet Commonly known as:  LEXAPRO   lisinopril 10 MG tablet Commonly known as:  PRINIVIL,ZESTRIL Take 10 mg by mouth as needed.        ROS:  A comprehensive review of systems was negative except for: Gastrointestinal: positive for reflux symptoms Genitourinary: positive for frequency Musculoskeletal: positive for back pain and stiff joints  Blood pressure 137/76, pulse 94, temperature 99.5 F (37.5 C), resp. rate 18, height 5\' 3"  (1.6 m), weight 200 lb (90.7 kg). Physical Exam  Constitutional: She is well-developed, well-nourished, and in no distress.  HENT:  Head: Normocephalic.  Eyes: Pupils are equal, round, and reactive to light.  Neck: Normal range of motion. Neck supple.  Cardiovascular: Normal rate and regular rhythm.  Pulmonary/Chest: Effort normal and breath sounds normal. Right breast exhibits no inverted nipple, no mass, no nipple discharge, no skin change and no tenderness. Left breast exhibits no inverted nipple, no mass, no nipple discharge,  no skin change and no tenderness.  Musculoskeletal: Normal range of motion.  Lymphadenopathy:    She has no cervical adenopathy.    She has no axillary adenopathy.  Vitals reviewed.   Results: 03/15/17 Mammogram/US  IMPRESSION: 1. 7 mm group of indeterminate calcifications in the posterior aspect of the upper-outer quadrant of the left breast. Differential considerations include ductal carcinoma in situ, fibrocystic changes and fibroadenoma. 2. Multiple small, benign right breast cysts.  RECOMMENDATION: Stereotactic guided core needle biopsy of the 7 mm group of indeterminate calcifications in the upper-outer quadrant of the  left breast. This recommendation will be called to Dr. Juel Burrow office on 03/16/2017.  I have discussed the findings and recommendations with the patient. Results were also provided in writing at the conclusion of the visit. If applicable, a reminder letter will be sent to the patient regarding the next appointment.  BI-RADS CATEGORY  4: Suspicious.   ADDENDUM REPORT: 04/01/2017 12:35  ADDENDUM: Pathology revealed INTERMEDIATE GRADE DUCTAL CARCINOMA IN SITU, CALCIFICATIONS of Left breast, upper outer. This was found to be concordant by Dr. Dorise Bullion. Pathology results were discussed with the patient by telephone. The patient reported doing well after the biopsy with tenderness at the site. Post biopsy instructions and care were reviewed and questions were answered. The patient was encouraged to call [facility] for any additional concerns. Surgical consultation has been arranged with Dr. Curlene Labrum at [facility], [place], [place] on April 07, 2017.  Pathology results reported by Roselind Messier, RN on 04/01/2017.  Electronically Signed By: Dorise Bullion III M.D On: 04/01/2017 12:35  Biopsy- 3/14  Diagnosis Breast, left, needle core biopsy, upper outer - DUCTAL CARCINOMA IN SITU - CALCIFICATIONS - SEE COMMENT  Assessment & Plan:  Melissa Weaver is a 67 y.o. female with DCIS of the left breast.  She will need to undergo partial mastectomy and then will need radiation therapy to the remaining breast tissue.    We have discussed the options for surgery given the report of DCIS being partial mastectomy (lumpectomy). We have discussed that there is a chance that a cancer still exists and we will find it on our specimen. If this is the case, she will need additional surgery including sentinel node and possibly removing more breast tissue. She will need to do radiation regardless.  As far as any chemotherapy, that will depend  on the finding after we remove the DCIS.  She will go discuss the findings with oncology following the procedure to see what treatments she qualifies for after surgery.   All questions were answered to the satisfaction of the patient and family.  The risk and benefits of partial mastectomy were discussed including but not limited to bleeding, risk of infection, risk of needing more surgery, and risk of disfigurement of the breast.  After careful consideration, Melissa Weaver has decided to proceed.    Virl Cagey 04/07/2017, 4:01 PM

## 2017-04-12 ENCOUNTER — Other Ambulatory Visit: Payer: Self-pay | Admitting: General Surgery

## 2017-04-12 DIAGNOSIS — D0512 Intraductal carcinoma in situ of left breast: Secondary | ICD-10-CM

## 2017-04-12 DIAGNOSIS — D0510 Intraductal carcinoma in situ of unspecified breast: Secondary | ICD-10-CM

## 2017-04-12 NOTE — H&P (Signed)
[facility] History and Physical  Reason for Referral: DCIS left breast  Referring Physician:  Dr. Amelia Jo MAYRANI KHAMIS is a 67 y.o. female.  HPI: Ms. 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 She had not had a mammogram in over 10 years, and decided that she would get one.  She does not report any breast pain, masses, lumps, nipple discharge, or skin changes. She has never had any breast biopsies or abnormal mammograms but was told she had some fibrocystic changes in her youth.    She had menarche at age 58, and her first pregnancy at age 60. She is G1P1. She did not breastfeed her children.  She has no history of any family breast cancer.  She underwent menopause at 20.  She has never had any previous biopsies or concerning areas on mammogram.  She has not had any chest radiation.  No family history of any breast cancers, but she does have a significant family history of pancreatic cancer.        Past Medical History:  Diagnosis Date  . Anxiety   . Depression   . HLD (hyperlipidemia)   . HTN (hypertension)    History reviewed. No pertinent surgical history.       Family History  Problem Relation Age of Onset  . Pancreatic cancer Mother   . Atrial fibrillation Father   . COPD Father   . Hypertension Father   . High Cholesterol Father   . Pancreatic cancer Maternal Aunt   . Pancreatic cancer Maternal Uncle     Social History        Tobacco Use  . Smoking status: Current Every Day Smoker    Types: Cigarettes  . Smokeless tobacco: Never Used  Substance Use Topics  . Alcohol use: Never    Frequency: Never  . Drug use: Never    Medications: I have reviewed the patient's current medications. Prior to Admission:  (Not in a hospital admission) Allergies as of 04/07/2017   No Known Allergies          Medication List            Accurate as of 04/07/17  4:01  PM. Always use your most recent med list.           ALPRAZolam 0.25 MG tablet Commonly known as:  XANAX   atorvastatin 20 MG tablet Commonly known as:  LIPITOR   denosumab 60 MG/ML Soln injection Commonly known as:  PROLIA Inject 60 mg into the skin every 6 (six) months. Administer in upper arm, thigh, or abdomen   escitalopram 10 MG tablet Commonly known as:  LEXAPRO   lisinopril 10 MG tablet Commonly known as:  PRINIVIL,ZESTRIL Take 10 mg by mouth as needed.        ROS:  A comprehensive review of systems was negative except for: Gastrointestinal: positive for reflux symptoms Genitourinary: positive for frequency Musculoskeletal: positive for back pain and stiff joints  Blood pressure 137/76, pulse 94, temperature 99.5 F (37.5 C), resp. rate 18, height 5\' 3"  (1.6 m), weight 200 lb (90.7 kg). Physical Exam  Constitutional: She is well-developed, well-nourished, and in no distress.  HENT:  Head: Normocephalic.  Eyes: Pupils are equal, round, and reactive to light.  Neck: Normal range of motion. Neck supple.  Cardiovascular: Normal rate and regular rhythm.  Pulmonary/Chest: Effort normal and breath sounds normal. Right breast exhibits no inverted nipple,  no mass, no nipple discharge, no skin change and no tenderness. Left breast exhibits no inverted nipple, no mass, no nipple discharge, no skin change and no tenderness.  Musculoskeletal: Normal range of motion.  Lymphadenopathy:    She has no cervical adenopathy.    She has no axillary adenopathy.  Vitals reviewed.   Results: 03/15/17 Mammogram/US  IMPRESSION: 1. 7 mm group of indeterminate calcifications in the posterior aspect of the upper-outer quadrant of the left breast. Differential considerations include ductal carcinoma in situ, fibrocystic changes and fibroadenoma. 2. Multiple small, benign right breast cysts.  RECOMMENDATION: Stereotactic guided core needle biopsy of the 7 mm group  of indeterminate calcifications in the upper-outer quadrant of the left breast. This recommendation will be called to Dr. Juel Burrow office on 03/16/2017.  I have discussed the findings and recommendations with the patient. Results were also provided in writing at the conclusion of the visit. If applicable, a reminder letter will be sent to the patient regarding the next appointment.  BI-RADS CATEGORY 4: Suspicious.   ADDENDUM REPORT: 04/01/2017 12:35  ADDENDUM: Pathology revealed INTERMEDIATE GRADE DUCTAL CARCINOMA IN SITU, CALCIFICATIONS of Left breast, upper outer. This was found to be concordant by Dr. Dorise Bullion. Pathology results were discussed with the patient by telephone. The patient reported doing well after the biopsy with tenderness at the site. Post biopsy instructions and care were reviewed and questions were answered. The patient was encouraged to call [facility] for any additional concerns. Surgical consultation has been arranged with Dr. Curlene Labrum at [facility], [place], [place] on April 07, 2017.  Pathology results reported by Roselind Messier, RN on 04/01/2017.  Electronically Signed By: Dorise Bullion III M.D On: 04/01/2017 12:35  Biopsy- 3/14  Diagnosis Breast, left, needle core biopsy, upper outer - DUCTAL CARCINOMA IN SITU - CALCIFICATIONS - SEE COMMENT  Assessment & Plan:  NORETTA FRIER is a 67 y.o. female with DCIS of the left breast.  She will need to undergo partial mastectomy and then will need radiation therapy to the remaining breast tissue.    We have discussed the options for surgery given the report of DCIS being partial mastectomy (lumpectomy). We have discussed that there is a chance that a cancer still exists and we will find it on our specimen. If this is the case, she will need additional surgery including sentinel node and possibly removing more breast tissue. She will need  to do radiation regardless.  As far as any chemotherapy, that will depend on the finding after we remove the DCIS.  She will go discuss the findings with oncology following the procedure to see what treatments she qualifies for after surgery.   All questions were answered to the satisfaction of the patient and family.  The risk and benefits of partial mastectomy were discussed including but not limited to bleeding, risk of infection, risk of needing more surgery, and risk of disfigurement of the breast.  After careful consideration, AMANDA STEUART has decided to proceed.    Virl Cagey 04/07/2017, 4:01 PM

## 2017-05-03 NOTE — Patient Instructions (Signed)
Melissa Weaver  05/03/2017     @PREFPERIOPPHARMACY @  Your procedure is scheduled on  05/11/2017   Report to [facility] at  730   A.M.  Call this number if you have problems the morning of surgery:  [phone]   Remember:  Do not eat food or drink liquids after midnight.  Take these medicines the morning of surgery with A SIP OF WATER  Xanax, lexapro, lisinopril.   Do not wear jewelry, make-up or nail polish.  Do not wear lotions, powders, or perfumes, or deodorant.  Do not shave 48 hours prior to surgery.  Men may shave face and neck.  Do not bring valuables to the hospital.  [facility] is not responsible for any belongings or valuables.  Contacts, dentures or bridgework may not be worn into surgery.  Leave your suitcase in the car.  After surgery it may be brought to your room.  For patients admitted to the hospital, discharge time will be determined by your treatment team.  Patients discharged the day of surgery will not be allowed to drive home.   Name and phone number of your driver:   family Special instructions:  None  Please read over the following fact sheets that you were given. Anesthesia Post-op Instructions and Care and Recovery After Surgery       Breast Biopsy A breast biopsy is a test during which a sample of tissue is taken from your breast. The breast tissue is looked at under a microscope for cancer cells. What happens before the procedure?  Plan to have someone take you home after the test.  Do not use tobacco products. These include cigarettes, chewing tobacco, or e-cigarettes. If you need help quitting, ask your doctor.  Do not drink alcohol for 24 hours before the test.  Ask your doctor about: ? Changing or stopping your normal medicines. This is important if you take diabetes medicines or blood thinners. ? Taking medicines such as aspirin and ibuprofen. These medicines can thin your blood. Do not take these medicines before  your procedure if your doctor tells you not to.  Wear a good support bra to the test.  Ask your doctor how your surgical site will be marked or identified.  You may be given antibiotic medicine to help prevent infection.  You may be checked for extra fluid in your body (lymphedema).  Your doctor may place a wire or a seed in the lump. The wire or seed gives off radiation. This will help your doctor to see the lump during the biopsy. What happens during the procedure? You may be given the following:  A medicine to numb the breast area (local anesthetic).  A medicine to help you relax (sedative).  There are different types of breast biopsies. Each type is described below. Fine-Needle Aspiration  A needle will be put into the breast lump.  The needle will take out fluid and cells from the lump. Core-Needle Biopsy  A needle will be put into the breast lump.  The needle will be put into your breast several times.  The needle will remove breast tissue. Stereotactic Biopsy  You will lie on a table on your belly. Your breast will pass through a hole in the table. Your breast will be held in place.  X-rays and a computer will be used to locate the breast lump.  A needle will be used to remove tissue samples from your breast.  Vacuum-Assisted Biopsy  A small cut (incision) will be made in your breast.  A biopsy device will be put through the cut and into the breast tissue.  The biopsy device will draw abnormal breast tissue into the biopsy device.  A large tissue sample will often be removed.  No stitches will be needed. Ultrasound-Guided Core-Needle Biopsy  Ultrasound imaging will help guide the needle into the area of the breast that is not normal.  A cut will be made in the breast. The needle will be put into the breast lump.  Tissue samples will be taken out. Surgical Biopsy  A cut will be made in the breast to remove tissue.  The cut will be closed with stitches  and covered with a bandage.  There are two types: ? Incisional biopsy. Your doctor will remove part of the breast lump. ? Excisional biopsy. Your doctor will try to remove the whole breast lump or as much as possible. All tissue or fluid samples will be looked at under a microscope. What happens after the procedure?  You will be able to go home when you are doing well and you are not having problems.  You may have bruising on your breast. This is normal.  A pressure bandage (dressing) may be put on your breast. It may be left on for 24-48 hours. This type of bandage is wrapped tightly around your chest. It helps to stop fluid from building up under tissues. You may also need to wear a supportive bra during this time.  Do not drive for 24 hours if you received a sedative. This information is not intended to replace advice given to you by your health care provider. Make sure you discuss any questions you have with your health care provider. Document Released: 03/29/2011 Document Revised: 09/11/2015 Document Reviewed: 10/08/2014 Elsevier Interactive Patient Education  2018 [place]. Partial Mastectomy With or Without Axillary Lymph Node Removal, Care After Refer to this sheet in the next few weeks. These instructions provide you with information about caring for yourself after your procedure. Your health care provider may also give you more specific instructions. Your treatment has been planned according to current medical practices, but problems sometimes occur. Call your health care provider if you have any problems or questions after your procedure. What can I expect after the procedure? After your procedure, it is common to have:  Breast swelling.  Breast tenderness.  Stiffness in your arm or shoulder.  A change in the shape and feel of your breast.  Follow these instructions at home: Bathing  Take sponge baths until your health care provider says that you can start showering  or bathing.  Do not take baths, swim, or use a hot tub until your health care provider approves. Incision care  There are many different ways to close and cover an incision, including stitches, skin glue, and adhesive strips. Follow your health care provider's instructions about: ? Incision care. ? Bandage (dressing) changes and removal. ? Incision closure removal.  Check your incision area every day for signs of infection. Watch for: ? Redness, swelling, or pain. ? Fluid, blood, or pus.  If you were sent home with a surgical drain in place, follow your health care provider's instructions for emptying it. Activity  Return to your normal activities as directed by your health care provider.  Avoid strenuous exercise.  Be careful to avoid any activities that could cause an injury to your arm on the side of your surgery.  Do not lift anything that is heavier than 10 lb (4.5 kg). Avoid lifting with the arm that is on the side of your surgery.  Do not carry heavy objects on your shoulder.  After your drain is removed, you should perform exercises to keep your arm from getting stiff and swollen. Talk with your health care provider about which exercises are safe for you. General instructions  Take medicines only as directed by your health care provider.  Keep your dressing clean and dry.  You may eat what you usually do.  Wear a supportive bra as directed by your health care provider.  Keep your arm elevated when at rest.  Do not wear tight jewelry on your arm, wrist, or fingers on the side of your surgery.  Get checked for extra fluid around your lymph nodes (lymphedema) as often as told by your health care provider.  If you had any lymph nodes removed during your procedure, be sure to tell all of your health care providers. This is important information to share before you are involved in certain procedures, such as giving blood or having your blood pressure taken. Contact a  health care provider if:  You have a fever.  Your pain medicine is not working.  Your swelling, weakness, or numbness in your arm has not improved after a few weeks.  You have new swelling in your breast or arm.  You have redness, swelling, or pain in your incision area.  You have fluid, blood, or pus coming from your incision. Get help right away if:  You have very bad pain in your breast or arm.  You have chest pain.  You have difficulty breathing. This information is not intended to replace advice given to you by your health care provider. Make sure you discuss any questions you have with your health care provider. Document Released: 08/19/2003 Document Revised: 09/11/2015 Document Reviewed: 09/19/2013 Elsevier Interactive Patient Education  2018 [place] Anesthesia, Adult General anesthesia is the use of medicines to make a person "go to sleep" (be unconscious) for a medical procedure. General anesthesia is often recommended when a procedure:  Is long.  Requires you to be still or in an unusual position.  Is major and can cause you to lose blood.  Is impossible to do without general anesthesia.  The medicines used for general anesthesia are called general anesthetics. In addition to making you sleep, the medicines:  Prevent pain.  Control your blood pressure.  Relax your muscles.  Tell a health care provider about:  Any allergies you have.  All medicines you are taking, including vitamins, herbs, eye drops, creams, and over-the-counter medicines.  Any problems you or family members have had with anesthetic medicines.  Types of anesthetics you have had in the past.  Any bleeding disorders you have.  Any surgeries you have had.  Any medical conditions you have.  Any history of heart or lung conditions, such as heart failure, sleep apnea, or chronic obstructive pulmonary disease (COPD).  Whether you are pregnant or may be  pregnant.  Whether you use tobacco, alcohol, marijuana, or street drugs.  Any history of Armed forces logistics/support/administrative officer.  Any history of depression or anxiety. What are the risks? Generally, this is a safe procedure. However, problems may occur, including:  Allergic reaction to anesthetics.  Lung and heart problems.  Inhaling food or liquids from your stomach into your lungs (aspiration).  Injury to nerves.  Waking up during your procedure and being unable  to move (rare).  Extreme agitation or a state of mental confusion (delirium) when you wake up from the anesthetic.  Air in the bloodstream, which can lead to stroke.  These problems are more likely to develop if you are having a major surgery or if you have an advanced medical condition. You can prevent some of these complications by answering all of your health care provider's questions thoroughly and by following all pre-procedure instructions. General anesthesia can cause side effects, including:  Nausea or vomiting  A sore throat from the breathing tube.  Feeling cold or shivery.  Feeling tired, washed out, or achy.  Sleepiness or drowsiness.  Confusion or agitation.  What happens before the procedure? Staying hydrated Follow instructions from your health care provider about hydration, which may include:  Up to 2 hours before the procedure - you may continue to drink clear liquids, such as water, clear fruit juice, black coffee, and plain tea.  Eating and drinking restrictions Follow instructions from your health care provider about eating and drinking, which may include:  8 hours before the procedure - stop eating heavy meals or foods such as meat, fried foods, or fatty foods.  6 hours before the procedure - stop eating light meals or foods, such as toast or cereal.  6 hours before the procedure - stop drinking milk or drinks that contain milk.  2 hours before the procedure - stop drinking clear  liquids.  Medicines  Ask your health care provider about: ? Changing or stopping your regular medicines. This is especially important if you are taking diabetes medicines or blood thinners. ? Taking medicines such as aspirin and ibuprofen. These medicines can thin your blood. Do not take these medicines before your procedure if your health care provider instructs you not to. ? Taking new dietary supplements or medicines. Do not take these during the week before your procedure unless your health care provider approves them.  If you are told to take a medicine or to continue taking a medicine on the day of the procedure, take the medicine with sips of water. General instructions   Ask if you will be going home the same day, the following day, or after a longer hospital stay. ? Plan to have someone take you home. ? Plan to have someone stay with you for the first 24 hours after you leave the hospital or clinic.  For 3-6 weeks before the procedure, try not to use any tobacco products, such as cigarettes, chewing tobacco, and e-cigarettes.  You may brush your teeth on the morning of the procedure, but make sure to spit out the toothpaste. What happens during the procedure?  You will be given anesthetics through a mask and through an IV tube in one of your veins.  You may receive medicine to help you relax (sedative).  As soon as you are asleep, a breathing tube may be used to help you breathe.  An anesthesia specialist will stay with you throughout the procedure. He or she will help keep you comfortable and safe by continuing to give you medicines and adjusting the amount of medicine that you get. He or she will also watch your blood pressure, pulse, and oxygen levels to make sure that the anesthetics do not cause any problems.  If a breathing tube was used to help you breathe, it will be removed before you wake up. The procedure may vary among health care providers and hospitals. What  happens after the procedure?  You will  wake up, often slowly, after the procedure is complete, usually in a recovery area.  Your blood pressure, heart rate, breathing rate, and blood oxygen level will be monitored until the medicines you were given have worn off.  You may be given medicine to help you calm down if you feel anxious or agitated.  If you will be going home the same day, your health care provider may check to make sure you can stand, drink, and urinate.  Your health care providers will treat your pain and side effects before you go home.  Do not drive for 24 hours if you received a sedative.  You may: ? Feel nauseous and vomit. ? Have a sore throat. ? Have mental slowness. ? Feel cold or shivery. ? Feel sleepy. ? Feel tired. ? Feel sore or achy, even in parts of your body where you did not have surgery. This information is not intended to replace advice given to you by your health care provider. Make sure you discuss any questions you have with your health care provider. Document Released: 04/13/2007 Document Revised: 06/17/2015 Document Reviewed: 12/19/2014 Elsevier Interactive Patient Education  2018 [place] Anesthesia, Adult, Care After These instructions provide you with information about caring for yourself after your procedure. Your health care provider may also give you more specific instructions. Your treatment has been planned according to current medical practices, but problems sometimes occur. Call your health care provider if you have any problems or questions after your procedure. What can I expect after the procedure? After the procedure, it is common to have:  Vomiting.  A sore throat.  Mental slowness.  It is common to feel:  Nauseous.  Cold or shivery.  Sleepy.  Tired.  Sore or achy, even in parts of your body where you did not have surgery.  Follow these instructions at home: For at least 24 hours after the  procedure:  Do not: ? Participate in activities where you could fall or become injured. ? Drive. ? Use heavy machinery. ? Drink alcohol. ? Take sleeping pills or medicines that cause drowsiness. ? Make important decisions or sign legal documents. ? Take care of children on your own.  Rest. Eating and drinking  If you vomit, drink water, juice, or soup when you can drink without vomiting.  Drink enough fluid to keep your urine clear or pale yellow.  Make sure you have little or no nausea before eating solid foods.  Follow the diet recommended by your health care provider. General instructions  Have a responsible adult stay with you until you are awake and alert.  Return to your normal activities as told by your health care provider. Ask your health care provider what activities are safe for you.  Take over-the-counter and prescription medicines only as told by your health care provider.  If you smoke, do not smoke without supervision.  Keep all follow-up visits as told by your health care provider. This is important. Contact a health care provider if:  You continue to have nausea or vomiting at home, and medicines are not helpful.  You cannot drink fluids or start eating again.  You cannot urinate after 8-12 hours.  You develop a skin rash.  You have fever.  You have increasing redness at the site of your procedure. Get help right away if:  You have difficulty breathing.  You have chest pain.  You have unexpected bleeding.  You feel that you are having a life-threatening or urgent problem. This  information is not intended to replace advice given to you by your health care provider. Make sure you discuss any questions you have with your health care provider. Document Released: 04/12/2000 Document Revised: 06/09/2015 Document Reviewed: 12/19/2014 Elsevier Interactive Patient Education  [employer].

## 2017-05-05 ENCOUNTER — Encounter (HOSPITAL_COMMUNITY): Payer: Self-pay

## 2017-05-05 ENCOUNTER — Other Ambulatory Visit: Payer: Self-pay

## 2017-05-05 ENCOUNTER — Encounter (HOSPITAL_COMMUNITY)
Admission: RE | Admit: 2017-05-05 | Discharge: 2017-05-05 | Disposition: A | Discharge status: Home / Self Care | Payer: PPO | Source: Ambulatory Visit | Attending: General Surgery | Admitting: General Surgery

## 2017-05-05 DIAGNOSIS — Z01818 Encounter for other preprocedural examination: Secondary | ICD-10-CM | POA: Diagnosis not present

## 2017-05-05 DIAGNOSIS — Z01812 Encounter for preprocedural laboratory examination: Secondary | ICD-10-CM | POA: Insufficient documentation

## 2017-05-05 HISTORY — DX: Unspecified osteoarthritis, unspecified site: M19.90

## 2017-05-05 HISTORY — DX: Unspecified convulsions: R56.9

## 2017-05-05 LAB — CBC WITH DIFFERENTIAL/PLATELET
Basophils Absolute: 0.1 10*3/uL (ref 0.0–0.1)
Basophils Relative: 1 %
EOS ABS: 0.2 10*3/uL (ref 0.0–0.7)
EOS PCT: 2 %
HCT: 43.7 % (ref 36.0–46.0)
Hemoglobin: 13.9 g/dL (ref 12.0–15.0)
LYMPHS ABS: 4.2 10*3/uL — AB (ref 0.7–4.0)
Lymphocytes Relative: 35 %
MCH: 28.2 pg (ref 26.0–34.0)
MCHC: 31.8 g/dL (ref 30.0–36.0)
MCV: 88.6 fL (ref 78.0–100.0)
MONO ABS: 0.9 10*3/uL (ref 0.1–1.0)
MONOS PCT: 7 %
Neutro Abs: 6.5 10*3/uL (ref 1.7–7.7)
Neutrophils Relative %: 55 %
PLATELETS: 301 10*3/uL (ref 150–400)
RBC: 4.93 MIL/uL (ref 3.87–5.11)
RDW: 14.1 % (ref 11.5–15.5)
WBC: 11.7 10*3/uL — ABNORMAL HIGH (ref 4.0–10.5)

## 2017-05-05 LAB — BASIC METABOLIC PANEL
Anion gap: 11 (ref 5–15)
BUN: 9 mg/dL (ref 6–20)
CO2: 24 mmol/L (ref 22–32)
CREATININE: 0.67 mg/dL (ref 0.44–1.00)
Calcium: 9.1 mg/dL (ref 8.9–10.3)
Chloride: 103 mmol/L (ref 101–111)
GFR calc Af Amer: >60 mL/min (ref >60)
Glucose, Bld: 111 mg/dL — ABNORMAL HIGH (ref 65–99)
Potassium: 4.3 mmol/L (ref 3.5–5.1)
SODIUM: 138 mmol/L (ref 135–145)

## 2017-05-11 ENCOUNTER — Encounter (HOSPITAL_COMMUNITY): Payer: Self-pay | Admitting: Anesthesiology

## 2017-05-11 ENCOUNTER — Other Ambulatory Visit: Payer: Self-pay | Admitting: General Surgery

## 2017-05-11 ENCOUNTER — Ambulatory Visit (HOSPITAL_COMMUNITY)
Admission: RE | Admit: 2017-05-11 | Discharge: 2017-05-11 | Disposition: A | Discharge status: Home / Self Care | Payer: PPO | Source: Ambulatory Visit | Attending: General Surgery | Admitting: General Surgery

## 2017-05-11 ENCOUNTER — Encounter (HOSPITAL_COMMUNITY): Payer: Self-pay

## 2017-05-11 ENCOUNTER — Ambulatory Visit (HOSPITAL_COMMUNITY): Payer: PPO | Admitting: Anesthesiology

## 2017-05-11 ENCOUNTER — Encounter (HOSPITAL_COMMUNITY)
Admission: RE | Disposition: A | Discharge status: Home / Self Care | Payer: Self-pay | Source: Ambulatory Visit | Attending: General Surgery

## 2017-05-11 DIAGNOSIS — D0510 Intraductal carcinoma in situ of unspecified breast: Secondary | ICD-10-CM

## 2017-05-11 DIAGNOSIS — D0512 Intraductal carcinoma in situ of left breast: Secondary | ICD-10-CM

## 2017-05-11 DIAGNOSIS — F419 Anxiety disorder, unspecified: Secondary | ICD-10-CM | POA: Insufficient documentation

## 2017-05-11 DIAGNOSIS — E785 Hyperlipidemia, unspecified: Secondary | ICD-10-CM | POA: Insufficient documentation

## 2017-05-11 DIAGNOSIS — IMO0002 Reserved for concepts with insufficient information to code with codable children: Secondary | ICD-10-CM

## 2017-05-11 DIAGNOSIS — I10 Essential (primary) hypertension: Secondary | ICD-10-CM | POA: Insufficient documentation

## 2017-05-11 DIAGNOSIS — F329 Major depressive disorder, single episode, unspecified: Secondary | ICD-10-CM | POA: Insufficient documentation

## 2017-05-11 DIAGNOSIS — F17200 Nicotine dependence, unspecified, uncomplicated: Secondary | ICD-10-CM | POA: Diagnosis not present

## 2017-05-11 DIAGNOSIS — F17210 Nicotine dependence, cigarettes, uncomplicated: Secondary | ICD-10-CM | POA: Insufficient documentation

## 2017-05-11 DIAGNOSIS — R229 Localized swelling, mass and lump, unspecified: Principal | ICD-10-CM

## 2017-05-11 DIAGNOSIS — Z79899 Other long term (current) drug therapy: Secondary | ICD-10-CM | POA: Insufficient documentation

## 2017-05-11 DIAGNOSIS — Z800 Family history of malignant neoplasm of digestive organs: Secondary | ICD-10-CM | POA: Diagnosis not present

## 2017-05-11 DIAGNOSIS — R569 Unspecified convulsions: Secondary | ICD-10-CM | POA: Diagnosis not present

## 2017-05-11 HISTORY — PX: BREAST LUMPECTOMY: SHX2

## 2017-05-11 HISTORY — PX: PARTIAL MASTECTOMY WITH NEEDLE LOCALIZATION: SHX6008

## 2017-05-11 SURGERY — PARTIAL MASTECTOMY WITH NEEDLE LOCALIZATION
Anesthesia: General | Site: Breast | Laterality: Left

## 2017-05-11 MED ORDER — CEFAZOLIN SODIUM-DEXTROSE 2-4 GM/100ML-% IV SOLN
INTRAVENOUS | Status: AC
  Filled 2017-05-11: qty 100

## 2017-05-11 MED ORDER — HYDROCODONE-ACETAMINOPHEN 7.5-325 MG PO TABS: 1.0000 | ORAL_TABLET | Freq: Once | ORAL | Status: DC | PRN

## 2017-05-11 MED ORDER — ONDANSETRON HCL 4 MG/2ML IJ SOLN: 4.0000 mg | Freq: Once | INTRAMUSCULAR | Status: DC | PRN

## 2017-05-11 MED ORDER — FENTANYL CITRATE (PF) 100 MCG/2ML IJ SOLN
INTRAMUSCULAR | Status: AC
  Filled 2017-05-11: qty 2

## 2017-05-11 MED ORDER — HYDROMORPHONE HCL 1 MG/ML IJ SOLN: 0.2500 mg | INTRAMUSCULAR | Status: DC | PRN

## 2017-05-11 MED ORDER — LIDOCAINE HCL (PF) 2 % IJ SOLN
INTRAMUSCULAR | Status: AC
  Filled 2017-05-11: qty 10

## 2017-05-11 MED ORDER — KETOROLAC TROMETHAMINE 30 MG/ML IJ SOLN: 30.0000 mg | Freq: Once | INTRAMUSCULAR | Status: DC | PRN

## 2017-05-11 MED ORDER — FENTANYL CITRATE (PF) 100 MCG/2ML IJ SOLN
INTRAMUSCULAR | Status: DC | PRN
  Administered 2017-05-11 (×2): 25 ug via INTRAVENOUS
  Administered 2017-05-11: 50 ug via INTRAVENOUS

## 2017-05-11 MED ORDER — 0.9 % SODIUM CHLORIDE (POUR BTL) OPTIME
TOPICAL | Status: DC | PRN
  Administered 2017-05-11: 1000 mL

## 2017-05-11 MED ORDER — BUPIVACAINE HCL (PF) 0.5 % IJ SOLN
INTRAMUSCULAR | Status: DC | PRN
  Administered 2017-05-11: 10 mL

## 2017-05-11 MED ORDER — CHLORHEXIDINE GLUCONATE CLOTH 2 % EX PADS
6.0000 | MEDICATED_PAD | Freq: Once | CUTANEOUS | Status: AC
  Administered 2017-05-11: 6 via TOPICAL

## 2017-05-11 MED ORDER — GLYCOPYRROLATE 0.2 MG/ML IJ SOLN
INTRAMUSCULAR | Status: AC
  Filled 2017-05-11: qty 1

## 2017-05-11 MED ORDER — PROPOFOL 10 MG/ML IV BOLUS
INTRAVENOUS | Status: AC
  Filled 2017-05-11: qty 20

## 2017-05-11 MED ORDER — DOCUSATE SODIUM 100 MG PO CAPS: 100.0000 mg | ORAL_CAPSULE | Freq: Two times a day (BID) | ORAL | 2 refills | Status: DC

## 2017-05-11 MED ORDER — PROPOFOL 10 MG/ML IV BOLUS
INTRAVENOUS | Status: DC | PRN
  Administered 2017-05-11: 150 mg via INTRAVENOUS

## 2017-05-11 MED ORDER — OXYCODONE HCL 5 MG PO TABS: 5.0000 mg | ORAL_TABLET | ORAL | 0 refills | Status: DC | PRN

## 2017-05-11 MED ORDER — ONDANSETRON HCL 4 MG/2ML IJ SOLN
INTRAMUSCULAR | Status: DC | PRN
  Administered 2017-05-11: 4 mg via INTRAVENOUS

## 2017-05-11 MED ORDER — ONDANSETRON HCL 4 MG/2ML IJ SOLN
INTRAMUSCULAR | Status: AC
  Filled 2017-05-11: qty 2

## 2017-05-11 MED ORDER — LIDOCAINE HCL (PF) 1 % IJ SOLN
INTRAMUSCULAR | Status: AC
  Filled 2017-05-11: qty 5

## 2017-05-11 MED ORDER — MEPERIDINE HCL 50 MG/ML IJ SOLN
6.2500 mg | INTRAMUSCULAR | Status: DC | PRN
  Administered 2017-05-11 (×2): 12.5 mg via INTRAVENOUS
  Filled 2017-05-11: qty 1

## 2017-05-11 MED ORDER — LIDOCAINE HCL 1 % IJ SOLN
INTRAMUSCULAR | Status: DC | PRN
  Administered 2017-05-11: 25 mg via INTRADERMAL

## 2017-05-11 MED ORDER — EPHEDRINE SULFATE 50 MG/ML IJ SOLN
INTRAMUSCULAR | Status: DC | PRN
  Administered 2017-05-11: 10 mg via INTRAVENOUS

## 2017-05-11 MED ORDER — CEFAZOLIN SODIUM-DEXTROSE 2-4 GM/100ML-% IV SOLN
2.0000 g | INTRAVENOUS | Status: AC
  Administered 2017-05-11: 2 g via INTRAVENOUS

## 2017-05-11 MED ORDER — LACTATED RINGERS IV SOLN
INTRAVENOUS | Status: DC | PRN
  Administered 2017-05-11: 09:00:00 via INTRAVENOUS

## 2017-05-11 MED ORDER — LACTATED RINGERS IV SOLN
INTRAVENOUS | Status: DC
  Administered 2017-05-11: 12:00:00 via INTRAVENOUS

## 2017-05-11 MED ORDER — MIDAZOLAM HCL 5 MG/5ML IJ SOLN
INTRAMUSCULAR | Status: DC | PRN
  Administered 2017-05-11: 2 mg via INTRAVENOUS

## 2017-05-11 MED ORDER — CHLORHEXIDINE GLUCONATE CLOTH 2 % EX PADS: 6.0000 | MEDICATED_PAD | Freq: Once | CUTANEOUS | Status: DC

## 2017-05-11 MED ORDER — FENTANYL CITRATE (PF) 100 MCG/2ML IJ SOLN
INTRAMUSCULAR | Status: AC
  Filled 2017-05-11: qty 4

## 2017-05-11 MED ORDER — BUPIVACAINE HCL (PF) 0.5 % IJ SOLN
INTRAMUSCULAR | Status: AC
  Filled 2017-05-11: qty 30

## 2017-05-11 MED ORDER — MIDAZOLAM HCL 2 MG/2ML IJ SOLN
INTRAMUSCULAR | Status: AC
  Filled 2017-05-11: qty 2

## 2017-05-11 SURGICAL SUPPLY — 30 items
BAG HAMPER (MISCELLANEOUS) ×3 IMPLANT
CLOTH BEACON ORANGE TIMEOUT ST (SAFETY) ×3 IMPLANT
COVER LIGHT HANDLE STERIS (MISCELLANEOUS) ×6 IMPLANT
DECANTER SPIKE VIAL GLASS SM (MISCELLANEOUS) ×3 IMPLANT
DERMABOND ADVANCED (GAUZE/BANDAGES/DRESSINGS) ×2
DERMABOND ADVANCED .7 DNX12 (GAUZE/BANDAGES/DRESSINGS) ×1 IMPLANT
DURAPREP 26ML APPLICATOR (WOUND CARE) ×3 IMPLANT
ELECT REM PT RETURN 9FT ADLT (ELECTROSURGICAL) ×3
ELECTRODE REM PT RTRN 9FT ADLT (ELECTROSURGICAL) ×1 IMPLANT
GLOVE BIO SURGEON STRL SZ 6.5 (GLOVE) ×4 IMPLANT
GLOVE BIO SURGEONS STRL SZ 6.5 (GLOVE) ×2
GLOVE BIOGEL PI IND STRL 6.5 (GLOVE) ×1 IMPLANT
GLOVE BIOGEL PI IND STRL 7.0 (GLOVE) ×2 IMPLANT
GLOVE BIOGEL PI INDICATOR 6.5 (GLOVE) ×2
GLOVE BIOGEL PI INDICATOR 7.0 (GLOVE) ×4
GOWN STRL REUS W/TWL LRG LVL3 (GOWN DISPOSABLE) ×6 IMPLANT
KIT TURNOVER KIT A (KITS) ×3 IMPLANT
MANIFOLD NEPTUNE II (INSTRUMENTS) ×3 IMPLANT
NEEDLE HYPO 25X1 1.5 SAFETY (NEEDLE) ×3 IMPLANT
NS IRRIG 1000ML POUR BTL (IV SOLUTION) ×3 IMPLANT
PACK MINOR (CUSTOM PROCEDURE TRAY) ×3 IMPLANT
PAD ARMBOARD 7.5X6 YLW CONV (MISCELLANEOUS) ×3 IMPLANT
SET BASIN LINEN APH (SET/KITS/TRAYS/PACK) ×3 IMPLANT
SPONGE LAP 18X18 X RAY DECT (DISPOSABLE) ×3 IMPLANT
SUT SILK 3 0 (SUTURE) ×2
SUT SILK 3-0 FS1 18XBRD (SUTURE) ×1 IMPLANT
SUT VIC AB 3-0 SH 27 (SUTURE) ×2
SUT VIC AB 3-0 SH 27X BRD (SUTURE) ×1 IMPLANT
SUT VIC AB 4-0 PS2 27 (SUTURE) ×3 IMPLANT
SYR CONTROL 10ML LL (SYRINGE) ×3 IMPLANT

## 2017-05-11 NOTE — Anesthesia Postprocedure Evaluation (Signed)
Anesthesia Post Note  Patient: Melissa Weaver  Procedure(s) Performed: PARTIAL MASTECTOMY WITH NEEDLE LOCALIZATION (Left Breast)  Patient location during evaluation: PACU Anesthesia Type: General Level of consciousness: awake and alert, oriented and patient cooperative Pain management: pain level controlled Vital Signs Assessment: post-procedure vital signs reviewed and stable Respiratory status: spontaneous breathing and respiratory function stable Cardiovascular status: stable Postop Assessment: no apparent nausea or vomiting Anesthetic complications: no     Last Vitals:  Vitals:   05/11/17 1104 05/11/17 1115  BP: (!) 145/83   Pulse:  62  Resp:  12  Temp: 36.9 C   SpO2:  98%    Last Pain:  Vitals:   05/11/17 1115  TempSrc:   PainSc: 0-No pain                 ADAMS, AMY A

## 2017-05-11 NOTE — Interval H&P Note (Signed)
History and Physical Interval Note:  05/11/2017 9:39 AM  Melissa Weaver  has presented today for surgery, with the diagnosis of DCIS left breast  The various methods of treatment have been discussed with the patient and family. After consideration of risks, benefits and other options for treatment, the patient has consented to  Procedure(s): PARTIAL MASTECTOMY WITH NEEDLE LOCALIZATION (Left) as a surgical intervention .  The patient's history has been reviewed, patient examined, no change in status, stable for surgery.  I have reviewed the patient's chart and labs.  Questions were answered to the patient's satisfaction.    No additional questions. Marked. Needle localization performed.   Virl Cagey

## 2017-05-11 NOTE — Transfer of Care (Signed)
Immediate Anesthesia Transfer of Care Note  Patient: Melissa Weaver  Procedure(s) Performed: PARTIAL MASTECTOMY WITH NEEDLE LOCALIZATION (Left Breast)  Patient Location: PACU  Anesthesia Type:General  Level of Consciousness: awake and patient cooperative  Airway & Oxygen Therapy: Patient Spontanous Breathing and Patient connected to face mask oxygen  Post-op Assessment: Report given to RN, Post -op Vital signs reviewed and stable and Patient moving all extremities  Post vital signs: Reviewed and stable  Last Vitals:  Vitals Value Taken Time  BP 111/66 05/11/2017 11:00 AM  Temp    Pulse 70 05/11/2017 11:03 AM  Resp 19 05/11/2017 11:03 AM  SpO2 100 % 05/11/2017 11:03 AM  Vitals shown include unvalidated device data.  Last Pain:  Vitals:   05/11/17 0901  TempSrc: Oral  PainSc: 0-No pain         Complications: No apparent anesthesia complications

## 2017-05-11 NOTE — Discharge Instructions (Signed)
Discharge Instructions: Shower per your regular routine. Take tylenol and ibuprofen as needed for pain control, alternating every 4-6 hours.  Take Roxicodone for breakthrough pain. Take colace for constipation related to narcotic pain medication. Do not pick at the dermabond glue on your incision sites.  You will be referred to Oncology so they can set you with the Radiation, and discuss other options for treatment such as a hormone pill.   Partial Mastectomy, Care After Refer to this sheet in the next few weeks. These instructions provide you with information about caring for yourself after your procedure. Your health care provider may also give you more specific instructions. Your treatment has been planned according to current medical practices, but problems sometimes occur. Call your health care provider if you have any problems or questions after your procedure. What can I expect after the procedure? After your procedure, it is common to have:  Breast swelling.  Breast tenderness.  Stiffness in your arm or shoulder.  A change in the shape and feel of your breast.  Follow these instructions at home: Bathing  Shower per your regular routine. No submerging in water.  Incision care  There are many different ways to close and cover an incision, including stitches, skin glue, and adhesive strips. Follow your health care provider's instructions about: ? Incision care. ? Bandage (dressing) changes and removal. ? Incision closure removal.  Check your incision area every day for signs of infection. Watch for: ? Redness, swelling, or pain. ? Fluid, blood, or pus.  If you were sent home with a surgical drain in place, follow your health care provider's instructions for emptying it. Activity  Return to your normal activities as directed by your health care provider.  Avoid strenuous exercise.  Be careful to avoid any activities that could cause an injury to your arm on the side of  your surgery.  Do not lift anything that is heavier than 10 lb (4.5 kg). Avoid lifting with the arm that is on the side of your surgery.  Do not carry heavy objects on your shoulder.  After your drain is removed, you should perform exercises to keep your arm from getting stiff and swollen. Talk with your health care provider about which exercises are safe for you. General instructions  Take medicines only as directed by your health care provider.  Keep your dressing clean and dry.  You may eat what you usually do.  Wear a supportive bra as directed by your health care provider.  Keep your arm elevated when at rest.  Do not wear tight jewelry on your arm, wrist, or fingers on the side of your surgery.  Get checked for extra fluid around your lymph nodes (lymphedema) as often as told by your health care provider.  If you had any lymph nodes removed during your procedure, be sure to tell all of your health care providers. This is important information to share before you are involved in certain procedures, such as giving blood or having your blood pressure taken. Contact a health care provider if:  You have a fever.  Your pain medicine is not working.  Your swelling, weakness, or numbness in your arm has not improved after a few weeks.  You have new swelling in your breast or arm.  You have redness, swelling, or pain in your incision area.  You have fluid, blood, or pus coming from your incision. Get help right away if:  You have very bad pain in your breast or  arm.  You have chest pain.  You have difficulty breathing. This information is not intended to replace advice given to you by your health care provider. Make sure you discuss any questions you have with your health care provider. Document Released: 08/19/2003 Document Revised: 09/11/2015 Document Reviewed: 09/19/2013 Elsevier Interactive Patient Education  2018 [place] Anesthesia, Adult, Care  After These instructions provide you with information about caring for yourself after your procedure. Your health care provider may also give you more specific instructions. Your treatment has been planned according to current medical practices, but problems sometimes occur. Call your health care provider if you have any problems or questions after your procedure. What can I expect after the procedure? After the procedure, it is common to have:  Vomiting.  A sore throat.  Mental slowness.  It is common to feel:  Nauseous.  Cold or shivery.  Sleepy.  Tired.  Sore or achy, even in parts of your body where you did not have surgery.  Follow these instructions at home: For at least 24 hours after the procedure:  Do not: ? Participate in activities where you could fall or become injured. ? Drive. ? Use heavy machinery. ? Drink alcohol. ? Take sleeping pills or medicines that cause drowsiness. ? Make important decisions or sign legal documents. ? Take care of children on your own.  Rest. Eating and drinking  If you vomit, drink water, juice, or soup when you can drink without vomiting.  Drink enough fluid to keep your urine clear or pale yellow.  Make sure you have little or no nausea before eating solid foods.  Follow the diet recommended by your health care provider. General instructions  Have a responsible adult stay with you until you are awake and alert.  Return to your normal activities as told by your health care provider. Ask your health care provider what activities are safe for you.  Take over-the-counter and prescription medicines only as told by your health care provider.  If you smoke, do not smoke without supervision.  Keep all follow-up visits as told by your health care provider. This is important. Contact a health care provider if:  You continue to have nausea or vomiting at home, and medicines are not helpful.  You cannot drink fluids or start  eating again.  You cannot urinate after 8-12 hours.  You develop a skin rash.  You have fever.  You have increasing redness at the site of your procedure. Get help right away if:  You have difficulty breathing.  You have chest pain.  You have unexpected bleeding.  You feel that you are having a life-threatening or urgent problem. This information is not intended to replace advice given to you by your health care provider. Make sure you discuss any questions you have with your health care provider. Document Released: 04/12/2000 Document Revised: 06/09/2015 Document Reviewed: 12/19/2014 Elsevier Interactive Patient Education  [employer].

## 2017-05-11 NOTE — Progress Notes (Signed)
Pt returned from [facility]

## 2017-05-11 NOTE — Progress Notes (Signed)
Pt taken to mammo for needle loc

## 2017-05-11 NOTE — Op Note (Signed)
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 Constance Haw, MD   Assistants: No qualified resident was available   Anesthesia: General endotracheal   Anesthesiologist: Dr. Chesley Mires, MD    Specimens:   Left breast tissue with needle localization (short superior, long lateral); superior, inferior, medial, lateral, superficial, deep margins    Estimated Blood Loss: Minimal   Blood Replacement: None    Complications: None   Wound Class: Clean    Operative Indications: Ms. Dascoli is a 67 yo with ductal carcinoma in situ found on biopsy who was referred to me for excision of this lesion.  She is otherwise relatively healthy, and has no prior masses or mammogram issues but was overdue on her mammogram.  After a discussion of the risk and benefits of partial mastectomy for the DCIS including the need for breast radiation, need for possible additional surgery pending margins and findings of invasive cancer, in addition to risk of bleeding, and infection, she opted to proceed.   Findings: Left breast tissue, no obvious masses    Procedure: The patient was taken to the operating room and placed supine. General endotracheal anesthesia was induced. Intravenous antibiotics were administered per protocol. The left breast and axilla was prepared and draped in the usual sterile fashion.   Prior to the patient coming to the OR, a needle localization of her biopsy site was performed. The images were reviewed in the lesion in the mid lateral portion of her breast.  The needle enter in the upper outer portion of the breast, and given the skin crease on the lateral part of the breast, the circumareolar incision was made medial to the wire. Flaps were created and the wire was brought into the field without  manipulation of its position.  Alis clamps were used to secure the wire and the specimen and a representative tissue around the needle was removed using a combination of sharp and electrocautery dissection.  The specimen and the wire were removed in the entirety.  The specimen was marked short superior, long lateral with silk suture. The specimen was sent to radiology, and confirm of the clip placement was confirmed.  Representative margins in the superficial, deep (almost down to the fascia), superior, inferior, lateral, and medial locations were taken and sent separately.  This excision was performed for DCIS so axillary sampling was not indicated at this time.    The cavity was irrigated, hemostasis was confirmed.  The deep dermal layer was closed with a running 3-0 Vicryl and the skin was closed with a 4-0 Monocryl and dermabond.     All counts were correct at the end of the case. The patient was awakened from anesthesia and extubate without complication.  The patient went to the PACU in stable condition.   Curlene Labrum, MD [facility] [address] [place], [place] [address] [phone] (office)

## 2017-05-11 NOTE — Anesthesia Preprocedure Evaluation (Signed)
Anesthesia Evaluation  Patient identified by MRN, date of birth, ID band Patient awake    Reviewed: Allergy & Precautions, H&P , NPO status , Patient's Chart, lab work & pertinent test results, reviewed documented beta blocker date and time   Airway Mallampati: II  TM Distance: >3 FB Neck ROM: full    Dental no notable dental hx.    Pulmonary neg pulmonary ROS, Current Smoker,    Pulmonary exam normal breath sounds clear to auscultation       Cardiovascular Exercise Tolerance: Good hypertension, negative cardio ROS   Rhythm:regular Rate:Normal     Neuro/Psych Seizures -,  Anxiety Depression negative neurological ROS  negative psych ROS   GI/Hepatic negative GI ROS, Neg liver ROS,   Endo/Other  negative endocrine ROS  Renal/GU negative Renal ROS  negative genitourinary   Musculoskeletal   Abdominal   Peds  Hematology negative hematology ROS (+)   Anesthesia Other Findings   Reproductive/Obstetrics negative OB ROS                             Anesthesia Physical Anesthesia Plan  ASA: III  Anesthesia Plan: General   Post-op Pain Management:    Induction:   PONV Risk Score and Plan:   Airway Management Planned:   Additional Equipment:   Intra-op Plan:   Post-operative Plan:   Informed Consent: I have reviewed the patients History and Physical, chart, labs and discussed the procedure including the risks, benefits and alternatives for the proposed anesthesia with the patient or authorized representative who has indicated his/her understanding and acceptance.   Dental Advisory Given  Plan Discussed with: CRNA  Anesthesia Plan Comments:         Anesthesia Quick Evaluation

## 2017-05-11 NOTE — Anesthesia Procedure Notes (Signed)
Procedure Name: LMA Insertion Date/Time: 05/11/2017 10:01 AM Performed by: Charmaine Downs, CRNA Pre-anesthesia Checklist: Patient identified, Patient being monitored, Emergency Drugs available, Timeout performed and Suction available Patient Re-evaluated:Patient Re-evaluated prior to induction Oxygen Delivery Method: Circle System Utilized Preoxygenation: Pre-oxygenation with 100% oxygen Induction Type: IV induction Ventilation: Mask ventilation without difficulty LMA: LMA inserted LMA Size: 4.0 Number of attempts: 1 Placement Confirmation: positive ETCO2 and breath sounds checked- equal and bilateral Tube secured with: Tape Dental Injury: Teeth and Oropharynx as per pre-operative assessment

## 2017-05-12 ENCOUNTER — Encounter (HOSPITAL_COMMUNITY): Payer: Self-pay | Admitting: General Surgery

## 2017-05-23 ENCOUNTER — Telehealth: Payer: Self-pay | Admitting: General Surgery

## 2017-05-23 NOTE — Telephone Encounter (Signed)
Pathology results. Informed patient. No further surgery needed.     Diagnosis 1. Breast, lumpectomy, left - INTERMEDIATE GRADE DUCTAL CARCINOMA IN SITU WITH CALCIFICATIONS, SPANNING 1.1 CM. - RESECTION MARGINS ARE NEGATIVE FOR CARCINOMA. - BIOPSY SITE. - SEE ONCOLOGY TABLE. 2. Breast, excision, left medial - BENIGN BREAST TISSUE. 3. Breast, excision, left superficial - BENIGN BREAST TISSUE. 4. Breast, excision, left inferior - BENIGN BREAST TISSUE. 5. Breast, excision, left superior - BENIGN BREAST TISSUE. 6. Breast, excision, left lateral - BENIGN BREAST TISSUE. 7. Breast, excision, left deep - BENIGN BREAST TISSUE.  Microscopic Comment 1. DCIS OF THE BREAST: Resection Procedure: Left breast lumpectomy with additional margins. Specimen Laterality: Left. Size of DCIS: 1.1 cm. Histologic Type: Ductal carcinoma in situ. Nuclear Grade: Intermediate grade. Necrosis: Present. Margins: Negative. Specify closest margin (required only if <82mm): all >10 mm. Regional Lymph Nodes: Number of Lymph Nodes Examined: 0 1 of 3 FINAL for [place], [place] ([id number])  Microscopic Comment(continued) Number of Sentinel Nodes Examined (if applicable): 0 Number of Lymph Nodes with Macrometastases : 0 Number of Lymph Nodes with Micrometastases): 0 Number of Lymph Nodes with Isolated Tumor Cells (0.2 mm or 200 cells)#: 0 Extranodal Extension: N/A. Representative Tumor Block: 1A-C. Pathologic Stage Classification (pTNM, AJCC 8th Edition): pTis, pNX Comment: E-cadherin is positive. (v4.2.0.0) Vicente Males MD Pathologist, Electronic Signature (Case signed 05/16/2017)  Follow up 05/26/2017.

## 2017-05-26 ENCOUNTER — Ambulatory Visit (INDEPENDENT_AMBULATORY_CARE_PROVIDER_SITE_OTHER): Payer: Self-pay | Admitting: General Surgery

## 2017-05-26 ENCOUNTER — Other Ambulatory Visit: Payer: Self-pay

## 2017-05-26 ENCOUNTER — Inpatient Hospital Stay (HOSPITAL_COMMUNITY): Payer: PPO | Attending: Hematology | Admitting: Hematology

## 2017-05-26 ENCOUNTER — Encounter (HOSPITAL_COMMUNITY): Payer: Self-pay | Admitting: Lab

## 2017-05-26 ENCOUNTER — Encounter (HOSPITAL_COMMUNITY): Payer: Self-pay | Admitting: Hematology

## 2017-05-26 ENCOUNTER — Encounter: Payer: Self-pay | Admitting: General Surgery

## 2017-05-26 VITALS — BP 130/66 | HR 79 | Temp 98.1°F | Resp 20 | Ht 61.5 in | Wt 196.9 lb

## 2017-05-26 VITALS — BP 131/72 | HR 77 | Temp 98.6°F | Resp 18 | Wt 196.0 lb

## 2017-05-26 DIAGNOSIS — Z170 Estrogen receptor positive status [ER+]: Secondary | ICD-10-CM | POA: Diagnosis not present

## 2017-05-26 DIAGNOSIS — F329 Major depressive disorder, single episode, unspecified: Secondary | ICD-10-CM | POA: Insufficient documentation

## 2017-05-26 DIAGNOSIS — F17210 Nicotine dependence, cigarettes, uncomplicated: Secondary | ICD-10-CM | POA: Diagnosis not present

## 2017-05-26 DIAGNOSIS — F419 Anxiety disorder, unspecified: Secondary | ICD-10-CM | POA: Diagnosis not present

## 2017-05-26 DIAGNOSIS — D0512 Intraductal carcinoma in situ of left breast: Secondary | ICD-10-CM | POA: Insufficient documentation

## 2017-05-26 DIAGNOSIS — E785 Hyperlipidemia, unspecified: Secondary | ICD-10-CM | POA: Insufficient documentation

## 2017-05-26 DIAGNOSIS — I10 Essential (primary) hypertension: Secondary | ICD-10-CM | POA: Diagnosis not present

## 2017-05-26 DIAGNOSIS — Z79899 Other long term (current) drug therapy: Secondary | ICD-10-CM | POA: Diagnosis not present

## 2017-05-26 DIAGNOSIS — M810 Age-related osteoporosis without current pathological fracture: Secondary | ICD-10-CM | POA: Insufficient documentation

## 2017-05-26 MED ORDER — TAMOXIFEN CITRATE 20 MG PO TABS: 20.0000 mg | ORAL_TABLET | Freq: Every day | ORAL | 3 refills | Status: DC

## 2017-05-26 NOTE — Assessment & Plan Note (Addendum)
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 -Biopsy of the left breast on 03/31/2017, DCIS, ER/PR positive -Left lumpectomy on 05/11/2017, intermediate grade DCIS, margins negative - I have recommended risk reduction therapy with 5 years of tamoxifen/aromatase inhibitor.  Because of her osteoporosis, I chose tamoxifen.  We talked about the side effects in detail.  Available data suggests that endocrine therapy provides risk reduction in ipsilateral breast and contralateral breast with ER positive DCIS.  Survival advantage has not been demonstrated.  We will also make a referral to Dr. Quitman Livings for whole breast radiation therapy.  We will see her back in 3 months to see how she is tolerating tamoxifen.  She was advised to have yearly Pap smears and mammograms.  After that we will see her once every 6 months for 5 years.  2.  Osteoporosis: Her last DEXA scan on 02/24/2017 shows osteoporosis with a T score of -3.2.  She is receiving Prolia through her PCP.

## 2017-05-26 NOTE — Patient Instructions (Signed)
[place] at [facility] Discharge Instructions  You were seen today by Dr. Delton Coombes. He is going to refer you to [facility] for radiation evaluation. He is going to get you scheduled for genetic counseling. Return in 3 months for labs and follow up     Thank you for choosing [place] at [facility] to provide your oncology and hematology care.  To afford each patient quality time with our provider, please arrive at least 15 minutes before your scheduled appointment time.   If you have a lab appointment with the [facility] please come in thru the  Main Entrance and check in at the main information desk  You need to re-schedule your appointment should you arrive 10 or more minutes late.  We strive to give you quality time with our providers, and arriving late affects you and other patients whose appointments are after yours.  Also, if you no show three or more times for appointments you may be dismissed from the clinic at the providers discretion.     Again, thank you for choosing [facility].  Our hope is that these requests will decrease the amount of time that you wait before being seen by our physicians.       _____________________________________________________________  Should you have questions after your visit to [facility], please contact our office at [phone] between the hours of 8:30 a.m. and 4:30 p.m.  Voicemails left after 4:30 p.m. will not be returned until the following business day.  For prescription refill requests, have your pharmacy contact our office.       Resources For Cancer Patients and their Caregivers ? American Cancer Society: Can assist with transportation, wigs, general needs, runs Look Good Feel Better.        ([phone] ? Cancer Care: Provides financial assistance, online support groups, medication/co-pay assistance.  1-800-813-HOPE 684-014-4885) ? [place] Assists [place] cancer patients and their families through emotional , educational and financial support.  270-525-1264 ? [place] DSS Where to apply for food stamps, Medicaid and utility assistance. 684 561 0541 ? [employer]: Transportation to medical appointments. 5032126387 ? Social Security Administration: May apply for disability if have a Stage IV cancer. (817)781-4863 667-812-3874 ? [employer], Disability and Transit Services: Assists with nutrition, care and transit needs. [place] Support Programs:   > Cancer Support Group  2nd Tuesday of the month 1pm-2pm, Journey Room   > Creative Journey  3rd Tuesday of the month 1130am-1pm, Journey Room

## 2017-05-26 NOTE — Addendum Note (Signed)
Addended by: Derek Jack on: 05/26/2017 04:06 PM   Modules accepted: Orders

## 2017-05-26 NOTE — Progress Notes (Signed)
[facility] Note   HPI:  67 y.o. Female presents to clinic for post-op follow-up evaluation after a left breast biopsy for DCIS. Patient reports she is doing well. She saw oncology today and is getting sent to [facility] for radiation.  Review of Systems:  No fevers or chills Incision healing well All other review of systems: otherwise negative   Pathology: 1. Breast, lumpectomy, left - INTERMEDIATE GRADE DUCTAL CARCINOMA IN SITU WITH CALCIFICATIONS, SPANNING 1.1 CM. - RESECTION MARGINS ARE NEGATIVE FOR CARCINOMA. - BIOPSY SITE. - SEE ONCOLOGY TABLE. 2. Breast, excision, left medial - BENIGN BREAST TISSUE. 3. Breast, excision, left superficial - BENIGN BREAST TISSUE. 4. Breast, excision, left inferior - BENIGN BREAST TISSUE. 5. Breast, excision, left superior - BENIGN BREAST TISSUE. 6. Breast, excision, left lateral - BENIGN BREAST TISSUE. 7. Breast, excision, left deep - BENIGN BREAST TISSUE.  Vital Signs:  BP 131/72 (BP Location: Left Arm, Patient Position: Sitting, Cuff Size: Normal)   Pulse 77   Temp 98.6 F (37 C)   Resp 18   Wt 196 lb (88.9 kg)   BMI 36.43 kg/m    Physical Exam:  Physical Exam  Constitutional: She appears well-developed.  HENT:  Head: Normocephalic.  Cardiovascular: Normal rate.  Pulmonary/Chest: Effort normal.  Left breast incision healing, dermabond peeling off, no erythema, some induration/ swelling  Vitals reviewed.   Laboratory studies:None   Imaging:  None    Assessment:  67 y.o. yo Female DCIS s/p excision. Doing well. No complaints.   Plan:  - Seeing oncology/ radiation - Follow up PRN   All of the above recommendations were discussed with the patient and patient's family, and all of patient's and family's questions were answered to their expressed satisfaction.  Curlene Labrum, MD [facility] [address] [place], [place] [address] [phone]  (office)

## 2017-05-26 NOTE — Progress Notes (Signed)
[facility] CONSULT NOTE  Patient Care Team: [name], MD as PCP - General (Internal Medicine)  CHIEF COMPLAINTS/PURPOSE OF CONSULTATION:  Stage 0 left breast DCIS; ER+/PR+   HISTORY OF PRESENTING ILLNESS:  Melissa Weaver 67 y.o. female is here for recent diagnosis of left breast DCIS.    Screening mammogram on 02/24/17 revealed left breast calcifications and possible right breast mass; diagnostic bilat mammogram with ultrasounds were recommended.   Prior to this mammogram, it had been about 10 years since her last mammogram.    Diagnostic mammogram/ultrasound on 03/15/17 revealed 7 mm group of indeterminate calcs in posterior aspect of UOQ of left breast.  Multiple small, benign right breast cysts noted.  Recommended stereotactic needle-core biopsy of (L) breast calcs biopsy.   (L) breast needle-core biopsy   INTERVAL HISTORY:  Melissa Weaver 67 y.o. female here for initial consultation for recently diagnosed left breast DCIS.    Here today with her sister.   Denies any headaches, dizziness, nausea, or diplopia.  She has occasional hot flashes, "but they're not too bad."    First menses: age 22 Last menses: between age [age] Age at first birth: age 45; denies any miscarriages Birth control: from ages [age] Hormone replacement therapy: took for about 2 months during menopause, then stopped.  No other personal history of cancers  Oncologic family history:  Mother: pancreatic Maternal aunt: pancreatic Maternal uncle: pancreatic  Maternal sister: lung Maternal uncle: leukemia Great maternal grandmother: colon   Discussed adjuvant treatment options including consideration for breast radiation therapy and anti-estrogen therapy.  Her last DEXA scan on 02/24/17 showed osteoporosis with T-score -3.2.  She is getting Prolia through her PCP.  Therefore, Tamoxifen would be the drug of choice for her.  Discussed risks, benefits, and alternatives to therapy.    She tells me  she is scheduled to follow-up with Dr. Constance Haw later today.      MEDICAL HISTORY:  Past Medical History:  Diagnosis Date  . Anxiety   . Arthritis   . Depression   . HLD (hyperlipidemia)   . HTN (hypertension)   . Seizures ([place])    had a seizure as child, unknown etiology, no meds though. No seizures since 10 years.    SURGICAL HISTORY: Past Surgical History:  Procedure Laterality Date  . PARTIAL MASTECTOMY WITH NEEDLE LOCALIZATION Left 05/11/2017   Procedure: PARTIAL MASTECTOMY WITH NEEDLE LOCALIZATION;  Surgeon: Virl Cagey, MD;  Location: [facility];  Service: General;  Laterality: Left;  . TUBAL LIGATION      SOCIAL HISTORY: Social History   Socioeconomic History  . Marital status: Married    Spouse name: Not on file  . Number of children: Not on file  . Years of education: Not on file  . Highest education level: Not on file  Occupational History  . Not on file  Social Needs  . Financial resource strain: Not on file  . Food insecurity:    Worry: Not on file    Inability: Not on file  . Transportation needs:    Medical: Not on file    Non-medical: Not on file  Tobacco Use  . Smoking status: Current Every Day Smoker    Packs/day: 1.00    Years: 40.00    Pack years: 40.00    Types: Cigarettes  . Smokeless tobacco: Never Used  Substance and Sexual Activity  . Alcohol use: Never    Frequency: Never  . Drug use: Never  . Sexual  activity: Yes    Birth control/protection: None  Lifestyle  . Physical activity:    Days per week: Not on file    Minutes per session: Not on file  . Stress: Not on file  Relationships  . Social connections:    Talks on phone: Not on file    Gets together: Not on file    Attends religious service: Not on file    Active member of club or organization: Not on file    Attends meetings of clubs or organizations: Not on file    Relationship status: Not on file  . Intimate partner violence:    Fear of current or ex partner: Not on  file    Emotionally abused: Not on file    Physically abused: Not on file    Forced sexual activity: Not on file  Other Topics Concern  . Not on file  Social History Narrative  . Not on file    FAMILY HISTORY: Family History  Problem Relation Age of Onset  . Pancreatic cancer Mother   . Atrial fibrillation Father   . COPD Father   . Hypertension Father   . High Cholesterol Father   . Pancreatic cancer Maternal Aunt   . Pancreatic cancer Maternal Uncle     ALLERGIES:  has No Known Allergies.  MEDICATIONS:  Current Outpatient Medications  Medication Sig Dispense Refill  . acetaminophen (TYLENOL) 650 MG CR tablet Take 1,300 mg by mouth daily as needed for pain.    Marland Kitchen ALPRAZolam (XANAX) 0.25 MG tablet Take 0.25 mg by mouth 3 (three) times daily as needed for anxiety.     Marland Kitchen atorvastatin (LIPITOR) 20 MG tablet Take 20 mg by mouth every evening.     . denosumab (PROLIA) 60 MG/ML SOLN injection Inject 60 mg into the skin every 6 (six) months. Administer in upper arm, thigh, or abdomen    . escitalopram (LEXAPRO) 10 MG tablet Take 10 mg by mouth daily.     Marland Kitchen lisinopril (PRINIVIL,ZESTRIL) 10 MG tablet Take 10 mg by mouth daily as needed (high bp).     . Magnesium 400 MG TABS Take 400 mg by mouth daily.     No current facility-administered medications for this visit.     REVIEW OF SYSTEMS:   Constitutional: Denies fevers, chills or abnormal night sweats.  Positive for fatigue. Eyes: Denies blurriness of vision, double vision or watery eyes Ears, nose, mouth, throat, and face: Denies mucositis or sore throat Respiratory: Denies cough, dyspnea or wheezes Cardiovascular: Denies palpitation, chest discomfort or lower extremity swelling Gastrointestinal:  Denies nausea, heartburn or change in bowel habits.  Positive for diarrhea of many years duration. Skin: Denies abnormal skin rashes Lymphatics: Denies new lymphadenopathy or easy bruising Neurological:Denies numbness, tingling or new  weaknesses Behavioral/Psych: Mood is stable, no new changes  All other systems were reviewed with the patient and are negative.  PHYSICAL EXAMINATION: ECOG PERFORMANCE STATUS: 1 - Symptomatic but completely ambulatory  Vitals:   05/26/17 1134  BP: 130/66  Pulse: 79  Resp: 20  Temp: 98.1 F (36.7 C)  SpO2: 97%   Filed Weights   05/26/17 1134  Weight: 196 lb 14.4 oz (89.3 kg)    GENERAL:alert, no distress and comfortable SKIN: skin color, texture, turgor are normal, no rashes or significant lesions EYES: normal, conjunctiva are pink and non-injected, sclera clear OROPHARYNX:no exudate, no erythema and lips, buccal mucosa, and tongue normal  NECK: supple, thyroid normal size, non-tender, without nodularity LYMPH:  no palpable lymphadenopathy in the cervical, axillary or inguinal LUNGS: clear to auscultation and percussion with normal breathing effort HEART: regular rate & rhythm and no murmurs and no lower extremity edema ABDOMEN:abdomen soft, non-tender and normal bowel sounds Musculoskeletal:no cyanosis of digits and no clubbing  PSYCH: alert & oriented x 3 with fluent speech Breast examination reveals well-healed lumpectomy scar in the upper outer quadrant.  No palpable masses in bilateral breast.  No palpable adenopathy.  LABORATORY DATA:  I have reviewed the data as listed Lab Results  Component Value Date   WBC 11.7 (H) 05/05/2017   HGB 13.9 05/05/2017   HCT 43.7 05/05/2017   MCV 88.6 05/05/2017   PLT 301 05/05/2017     Chemistry      Component Value Date/Time   NA 138 05/05/2017 1103   K 4.3 05/05/2017 1103   CL 103 05/05/2017 1103   CO2 24 05/05/2017 1103   BUN 9 05/05/2017 1103   CREATININE 0.67 05/05/2017 1103      Component Value Date/Time   CALCIUM 9.1 05/05/2017 1103       RADIOGRAPHIC STUDIES: I have personally reviewed the radiological images as listed and agreed with the findings in the report. Mm Breast Surgical Specimen  Result Date:  05/11/2017 CLINICAL DATA:  DCIS LEFT breast, lumpectomy post preoperative needle localization EXAM: SPECIMEN RADIOGRAPH OF THE LEFT BREAST COMPARISON:  Preoperative needle localization images of 05/11/2017 FINDINGS: Status post excision of the LEFT breast. The wire tip and biopsy marker clip are present within the specimen. The biopsy clip was localized within the specimen for the pathology. IMPRESSION: Specimen radiograph of the LEFT breast. Electronically Signed   By: Lavonia Dana M.D.   On: 05/11/2017 11:15   Mm Lt Breast Bx W Loc Dev 1st Lesion Image Bx Spec Stereo Guide  Result Date: 05/11/2017 CLINICAL DATA:  DCIS LEFT breast EXAM: NEEDLE LOCALIZATION OF THE LEFT BREAST WITH MAMMO GUIDANCE COMPARISON:  03/31/2017 FINDINGS: Patient presents for preoperative needle localization of the LEFT breast. Procedure, risks and benefits were discussed with the patient and written informed consent was obtained. Time-out protocol followed. Access point to the biopsy site was localized with a BB and mammographic imaging. Skin prepped with chlorhexidine. Skin and soft tissues were anesthetized with 3 mL of 1% lidocaine. Grid technique was utilized. 5 cm length Kopan's needle was advanced into the LEFT breast and placed adjacent to the biopsy clip. A hookwire was placed and the needle was removed. Procedure was tolerated very well with the patient without immediate complication. IMPRESSION: Needle localization LEFT breast. No apparent complications. Electronically Signed   By: Lavonia Dana M.D.   On: 05/11/2017 11:14    PATHOLOGY:  (L) breast lumpectomy surgical path: 05/11/17     (L) breast biopsy path: 03/31/17         ASSESSMENT & PLAN:  DCIS (ductal carcinoma in situ) 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 -Biopsy of the left breast on 03/31/2017, DCIS, ER/PR positive -Left lumpectomy on 05/11/2017, intermediate grade DCIS, margins negative - I have  recommended risk reduction therapy with 5 years of tamoxifen/aromatase inhibitor.  Because of her osteoporosis, I chose tamoxifen.  We talked about the side effects in detail.  Available data suggests that endocrine therapy provides risk reduction in ipsilateral breast and contralateral breast with ER positive DCIS.  Survival advantage has not been demonstrated.  We will also make a referral to Dr. Quitman Livings for whole  breast radiation therapy.  We will see her back in 3 months to see how she is tolerating tamoxifen.  She was advised to have yearly Pap smears and mammograms.  After that we will see her once every 6 months for 5 years.  2.  Osteoporosis: Her last DEXA scan on 02/24/2017 shows osteoporosis with a T score of -3.2.  She is receiving Prolia through her PCP.  Orders Placed This Encounter  Procedures  . CBC with Differential/Platelet    Standing Status:   Future    Standing Expiration Date:   05/27/2018  . Comprehensive metabolic panel    Standing Status:   Future    Standing Expiration Date:   05/27/2018    Order Specific Question:   Has the patient fasted?    Answer:   No    All questions were answered. The patient knows to call the clinic with any problems, questions or concerns.    This note includes documentation from Mike Craze, NP, who was present during this patient's office visit and evaluation.  I have reviewed this note for its completeness and accuracy.  I have edited this note accordingly based on my findings and medical opinion.      Derek Jack, MD 05/26/2017 2:36 PM

## 2017-05-26 NOTE — Progress Notes (Unsigned)
Referral sent to [facility]. Records faxed on 5/9

## 2017-05-26 NOTE — Patient Instructions (Signed)
Follow up with Oncology and Radiation Oncology 

## 2017-06-02 DIAGNOSIS — C50912 Malignant neoplasm of unspecified site of left female breast: Secondary | ICD-10-CM | POA: Diagnosis not present

## 2017-06-02 DIAGNOSIS — Z510 Encounter for antineoplastic radiation therapy: Secondary | ICD-10-CM | POA: Diagnosis not present

## 2017-06-02 DIAGNOSIS — D0512 Intraductal carcinoma in situ of left breast: Secondary | ICD-10-CM | POA: Diagnosis not present

## 2017-06-07 DIAGNOSIS — I10 Essential (primary) hypertension: Secondary | ICD-10-CM | POA: Diagnosis not present

## 2017-06-07 DIAGNOSIS — R7301 Impaired fasting glucose: Secondary | ICD-10-CM | POA: Diagnosis not present

## 2017-06-09 DIAGNOSIS — D0512 Intraductal carcinoma in situ of left breast: Secondary | ICD-10-CM | POA: Diagnosis not present

## 2017-06-10 DIAGNOSIS — R7301 Impaired fasting glucose: Secondary | ICD-10-CM | POA: Diagnosis not present

## 2017-06-10 DIAGNOSIS — Z6833 Body mass index (BMI) 33.0-33.9, adult: Secondary | ICD-10-CM | POA: Diagnosis not present

## 2017-06-10 DIAGNOSIS — E782 Mixed hyperlipidemia: Secondary | ICD-10-CM | POA: Diagnosis not present

## 2017-06-10 DIAGNOSIS — M810 Age-related osteoporosis without current pathological fracture: Secondary | ICD-10-CM | POA: Diagnosis not present

## 2017-06-10 DIAGNOSIS — C50919 Malignant neoplasm of unspecified site of unspecified female breast: Secondary | ICD-10-CM | POA: Diagnosis not present

## 2017-06-10 DIAGNOSIS — F419 Anxiety disorder, unspecified: Secondary | ICD-10-CM | POA: Diagnosis not present

## 2017-06-10 DIAGNOSIS — F17200 Nicotine dependence, unspecified, uncomplicated: Secondary | ICD-10-CM | POA: Diagnosis not present

## 2017-06-10 DIAGNOSIS — I10 Essential (primary) hypertension: Secondary | ICD-10-CM | POA: Diagnosis not present

## 2017-06-14 ENCOUNTER — Telehealth (HOSPITAL_COMMUNITY): Payer: Self-pay

## 2017-06-14 NOTE — Telephone Encounter (Signed)
Patients sister called and left message because she was unsure if patient was supposed to have started the Tamoxifen before she has radiation therapy. Radiation is scheduled to start next week. Patient also went without wearing a bra for 2 days because of the heat. During this time her incision became red and warm. Patient started wearing her bra again and the incision cleared up. The sister wants to know what to use for radiation discomfort and heat bumps on patients chest. Will review with MD and call patients sister back.

## 2017-06-14 NOTE — Telephone Encounter (Signed)
After reviewing with Dr. Delton Coombes, called patients sister back and explained that Dr. Raliegh Ip said it was okay to have already started the tamoxifen. Also if she has any issues with the incision to call the surgeon. As for what to use for radiation discomfort, Dr. Raliegh Ip is deferring that question to radiation oncologist. Patients sister verbalized understanding.

## 2017-06-16 ENCOUNTER — Encounter: Payer: Self-pay | Admitting: General Surgery

## 2017-06-16 ENCOUNTER — Ambulatory Visit (INDEPENDENT_AMBULATORY_CARE_PROVIDER_SITE_OTHER): Payer: Self-pay | Admitting: General Surgery

## 2017-06-16 VITALS — BP 131/82 | HR 89 | Temp 98.4°F | Resp 20 | Wt 199.0 lb

## 2017-06-16 DIAGNOSIS — D0512 Intraductal carcinoma in situ of left breast: Secondary | ICD-10-CM | POA: Diagnosis not present

## 2017-06-16 DIAGNOSIS — N611 Abscess of the breast and nipple: Secondary | ICD-10-CM

## 2017-06-16 MED ORDER — SULFAMETHOXAZOLE-TRIMETHOPRIM 800-160 MG PO TABS: 1.0000 | ORAL_TABLET | Freq: Two times a day (BID) | ORAL | 0 refills | Status: AC

## 2017-06-16 NOTE — Progress Notes (Signed)
[facility]   HPI:  67 y.o. Female presents to clinic for follow-up evaluation of the left breast. She had surgery for DCIS with a lumpectomy on 05/11/2017, over 30 days ago. And had been healing well without issues. She had seen Oncology and was soon to start radiation.  She reported not wearing a bra and noticing redness of the left breast around the surgical site on Sunday. It then improved some but has worsened since that time.     Review of Systems:  Redness/ tenderness of the left breast All other review of systems: otherwise negative   Vital Signs:  BP 131/82 (BP Location: Right Arm, Patient Position: Sitting, Cuff Size: Normal)   Pulse 89   Temp 98.4 F (36.9 C) (Temporal)   Resp 20   Wt 199 lb (90.3 kg)   BMI 36.99 kg/m    Physical Exam:  Physical Exam  Constitutional: She appears well-developed.  HENT:  Head: Normocephalic.  Eyes: Pupils are equal, round, and reactive to light.  Pulmonary/Chest:  Left breast incision healed, erythema around the incision and extending down to the nipple lower breast area. No drainage, incision without signs of opening up, some induration, 10cc of cloudy serous fluid removed  Vitals reviewed.   Laboratory studies: None   Imaging:  None    Assessment:  67 y.o. yo Female with cellulitis/ abscess of the left breast with cloudy fluid removed form the seroma pocket.  Unsure why has infection 30 days out from the surgery. Very odd.   Plan:  - Bactrim sent to her pharmacy   - Fluid pulled out with needle in the office  - Will see Tuesday to reassess  - Patient letting the radiation physicians know about the infection   All of the above recommendations were discussed with the patient and patient's family, and all of patient's and family's questions were answered to their expressed satisfaction.  Curlene Labrum, MD [facility] [address] [place], [place] [address] [phone]  (office)

## 2017-06-16 NOTE — Patient Instructions (Addendum)
Take antibiotics as needed. Call for worsening redness/ drainage/ fevers/ swelling.  Take some probiotics/ yogurt while in antibiotics.  If not improving, call and will change antibiotic.

## 2017-06-17 DIAGNOSIS — D0512 Intraductal carcinoma in situ of left breast: Secondary | ICD-10-CM | POA: Diagnosis not present

## 2017-06-21 ENCOUNTER — Encounter: Payer: Self-pay | Admitting: General Surgery

## 2017-06-21 ENCOUNTER — Ambulatory Visit (INDEPENDENT_AMBULATORY_CARE_PROVIDER_SITE_OTHER): Payer: Self-pay | Admitting: General Surgery

## 2017-06-21 VITALS — BP 152/73 | HR 89 | Temp 98.4°F | Resp 18 | Wt 199.0 lb

## 2017-06-21 DIAGNOSIS — D0512 Intraductal carcinoma in situ of left breast: Secondary | ICD-10-CM

## 2017-06-21 DIAGNOSIS — N611 Abscess of the breast and nipple: Secondary | ICD-10-CM

## 2017-06-21 NOTE — Progress Notes (Signed)
[facility] Note   HPI:  67 y.o. Female presents to clinic for follow up left breast abscess/ infection. Patient reports improvement in the cellulitis. She has some continued swelling. Denies fevers. She is on about day 5 of the antibiotics.    Review of Systems:  Improved redness No fevers  All other review of systems: otherwise negative   Vital Signs:  BP (!) 152/73 (BP Location: Left Arm, Patient Position: Sitting, Cuff Size: Normal)   Pulse 89   Temp 98.4 F (36.9 C) (Temporal)   Resp 18   Wt 199 lb (90.3 kg)   BMI 36.99 kg/m    Physical Exam:  Physical Exam  Constitutional: She appears well-developed.  HENT:  Head: Normocephalic.  Eyes: Pupils are equal, round, and reactive to light.  Neck: Normal range of motion.  Cardiovascular: Normal rate.  Pulmonary/Chest: Effort normal.  Left breast incision intact, some swelling on the lateral aspect, fluctuant, aspiration of about 3cc of dark old blood, erythema improved and only just around the incision  Vitals reviewed.   Laboratory studies: None    Imaging:  None    Assessment:  67 y.o. yo Female with improving left breast abscess/ cellulitis. Aspiration done but only old blood at this time. H/o DCIS Of the left breast and needing to get radiation.   Plan:  - Continue antibiotics   - Will see on Thursday to reassess  - Radiation on hold for now until Monday 6/10, Will update Dr. Quitman Livings on Thursday   All of the above recommendations were discussed with the patient, and all of patient's questions were answered to her expressed satisfaction.  Curlene Labrum, MD [facility] [address] [place], [place] [address] [phone] (office)

## 2017-06-21 NOTE — Patient Instructions (Signed)
Wound check

## 2017-06-23 ENCOUNTER — Encounter: Payer: Self-pay | Admitting: General Surgery

## 2017-06-23 ENCOUNTER — Ambulatory Visit (INDEPENDENT_AMBULATORY_CARE_PROVIDER_SITE_OTHER): Payer: Self-pay | Admitting: General Surgery

## 2017-06-23 VITALS — BP 141/70 | HR 90 | Temp 97.8°F | Resp 18 | Wt 199.0 lb

## 2017-06-23 DIAGNOSIS — N611 Abscess of the breast and nipple: Secondary | ICD-10-CM

## 2017-06-23 MED ORDER — AMOXICILLIN-POT CLAVULANATE 875-125 MG PO TABS: 1.0000 | ORAL_TABLET | Freq: Two times a day (BID) | ORAL | 0 refills | Status: DC

## 2017-06-23 NOTE — Progress Notes (Signed)
[facility]   HPI:  67 y.o. Female presents to clinic for follow-up evaluation of the left breast abscess. Patient reports it is still improving. Still some redness around the incision.  Review of Systems:  No fevers or chills On bactrim  All other review of systems: otherwise negative   Vital Signs:  BP (!) 141/70 (BP Location: Left Arm, Patient Position: Sitting, Cuff Size: Normal)   Pulse 90   Temp 97.8 F (36.6 C) (Temporal)   Resp 18   Wt 199 lb (90.3 kg)   BMI 36.99 kg/m    Physical Exam:  Physical Exam  Constitutional: She appears well-developed and well-nourished.  HENT:  Head: Normocephalic.  Eyes: Pupils are equal, round, and reactive to light.  Cardiovascular: Normal rate.  Pulmonary/Chest: Effort normal.  Left breast incision with localized erythema and some swelling continues at the lateral edge, nothing to aspirate, feels warm  Vitals reviewed.   Laboratory studies: None   Imaging:  None    Assessment:  67 y.o. yo Female with a left breast abscess about 5-6 weeks post left breast partial mastectomy for DCIS.  Improving.   Plan:  - Plan to change to Augmentin since this is not completely resolved yet and has been on bactrim for about 7 days now   - Talked with Dr. Quitman Livings, Elsie [place], will hold on the Radiation for another week   Future Appointments  Date Time Provider [place]  06/30/2017 11:45 AM Virl Cagey, MD [facility] None  08/22/2017 12:10 PM [facility] None  08/25/2017 11:00 AM Clarene Essex, Counselor [facility] None  08/25/2017 12:00 PM [facility] None  08/29/2017  2:00 PM Derek Jack, MD [facility] None  09/14/2017  2:00 PM [facility] IV/MED INFUSION ROOM [facility] None  09/14/2017  2:15 PM [facility] IV/MED INFUSION ROOM [facility] None    All of the above recommendations were discussed with the patient, and all of patient's questions were answered to her expressed satisfaction.  Curlene Labrum, MD [facility] [address] [place], [place] [address] [phone] (office)

## 2017-06-23 NOTE — Patient Instructions (Signed)
Change to augmentin. Stop the bactrim.

## 2017-06-30 ENCOUNTER — Other Ambulatory Visit (HOSPITAL_COMMUNITY): Payer: PPO

## 2017-06-30 ENCOUNTER — Encounter (HOSPITAL_COMMUNITY): Payer: PPO | Admitting: Genetic Counselor

## 2017-06-30 ENCOUNTER — Ambulatory Visit (INDEPENDENT_AMBULATORY_CARE_PROVIDER_SITE_OTHER): Payer: Self-pay | Admitting: General Surgery

## 2017-06-30 ENCOUNTER — Encounter: Payer: Self-pay | Admitting: General Surgery

## 2017-06-30 VITALS — BP 137/79 | HR 79 | Temp 98.7°F | Resp 18 | Wt 200.0 lb

## 2017-06-30 DIAGNOSIS — N611 Abscess of the breast and nipple: Secondary | ICD-10-CM

## 2017-06-30 DIAGNOSIS — D0512 Intraductal carcinoma in situ of left breast: Secondary | ICD-10-CM

## 2017-06-30 NOTE — Progress Notes (Signed)
[facility]   HPI: 67 y.o. Female presents to clinic for follow-up evaluation of her left breast abscess s/p lumpectomy for DCIS. Patient reports improved redness and pain. Some drainage with neosporin application.   Review of Systems:  No fevers or chills  All other review of systems: otherwise negative   Vital Signs:  BP 137/79 (BP Location: Left Arm, Patient Position: Sitting, Cuff Size: Normal)   Pulse 79   Temp 98.7 F (37.1 C) (Temporal)   Resp 18   Wt 200 lb (90.7 kg)   BMI 37.18 kg/m    Physical Exam:  Physical Exam  Constitutional: She appears well-developed.  HENT:  Head: Normocephalic.  Cardiovascular: Normal rate.  Pulmonary/Chest: Effort normal.  Left breast incision with some redness from bandaid around the incision, lateral aspect of incision with some bruising/ blood blister, some induration  Vitals reviewed.   Laboratory studies: None   Imaging:  None    Assessment:  67 y.o. yo Female with healing left breast abscess s/p aspiration and antibiotics. doing better.  Plan:  - Stop applying the bandaid as it is breaking out her skin - Finish antibiotics  - Should be good next week to do Radiation - Will notify Dr. Quitman Livings  - Follow up PRN  All of the above recommendations were discussed with the patient, and all of patient's questions were answered to her expressed satisfaction.  Curlene Labrum, MD [facility] [address] [place],  [address] [phone] (office)

## 2017-07-01 DIAGNOSIS — Z510 Encounter for antineoplastic radiation therapy: Secondary | ICD-10-CM | POA: Diagnosis not present

## 2017-07-01 DIAGNOSIS — C50912 Malignant neoplasm of unspecified site of left female breast: Secondary | ICD-10-CM | POA: Diagnosis not present

## 2017-07-01 DIAGNOSIS — D0512 Intraductal carcinoma in situ of left breast: Secondary | ICD-10-CM | POA: Diagnosis not present

## 2017-07-04 ENCOUNTER — Telehealth: Payer: Self-pay | Admitting: General Surgery

## 2017-07-04 DIAGNOSIS — D0512 Intraductal carcinoma in situ of left breast: Secondary | ICD-10-CM | POA: Diagnosis not present

## 2017-07-04 MED ORDER — FLUCONAZOLE 100 MG PO TABS: 100.0000 mg | ORAL_TABLET | ORAL | 0 refills | Status: DC

## 2017-07-04 NOTE — Telephone Encounter (Signed)
[facility]  Complaining of yeast infection and thrush. Has been on antibiotics. Patient needs to notify radiation physicians.   Curlene Labrum, MD [facility] [address] [place], [place] [address] [phone] (office)

## 2017-07-06 ENCOUNTER — Telehealth: Payer: Self-pay | Admitting: General Surgery

## 2017-07-06 ENCOUNTER — Other Ambulatory Visit: Payer: Self-pay | Admitting: Emergency Medicine

## 2017-07-06 MED ORDER — NYSTATIN 100000 UNIT/ML MT SUSP: 5.0000 mL | Freq: Four times a day (QID) | OROMUCOSAL | 0 refills | Status: DC

## 2017-07-06 NOTE — Telephone Encounter (Signed)
Patient requesting swish for thrush.  Nystatin suspension sent.   Curlene Labrum, MD [facility] [address] [place], [place] [address] [phone] (office)

## 2017-07-11 DIAGNOSIS — D0512 Intraductal carcinoma in situ of left breast: Secondary | ICD-10-CM | POA: Diagnosis not present

## 2017-07-18 DIAGNOSIS — D0512 Intraductal carcinoma in situ of left breast: Secondary | ICD-10-CM | POA: Diagnosis not present

## 2017-07-18 DIAGNOSIS — Z510 Encounter for antineoplastic radiation therapy: Secondary | ICD-10-CM | POA: Diagnosis not present

## 2017-07-18 DIAGNOSIS — C50912 Malignant neoplasm of unspecified site of left female breast: Secondary | ICD-10-CM | POA: Diagnosis not present

## 2017-07-26 DIAGNOSIS — D0512 Intraductal carcinoma in situ of left breast: Secondary | ICD-10-CM | POA: Diagnosis not present

## 2017-08-02 DIAGNOSIS — E875 Hyperkalemia: Secondary | ICD-10-CM | POA: Diagnosis not present

## 2017-08-02 DIAGNOSIS — M810 Age-related osteoporosis without current pathological fracture: Secondary | ICD-10-CM | POA: Diagnosis not present

## 2017-08-02 DIAGNOSIS — E782 Mixed hyperlipidemia: Secondary | ICD-10-CM | POA: Diagnosis not present

## 2017-08-02 DIAGNOSIS — I10 Essential (primary) hypertension: Secondary | ICD-10-CM | POA: Diagnosis not present

## 2017-08-02 DIAGNOSIS — F411 Generalized anxiety disorder: Secondary | ICD-10-CM | POA: Diagnosis not present

## 2017-08-02 DIAGNOSIS — F419 Anxiety disorder, unspecified: Secondary | ICD-10-CM | POA: Diagnosis not present

## 2017-08-02 DIAGNOSIS — C50919 Malignant neoplasm of unspecified site of unspecified female breast: Secondary | ICD-10-CM | POA: Diagnosis not present

## 2017-08-22 ENCOUNTER — Inpatient Hospital Stay (HOSPITAL_COMMUNITY): Payer: PPO | Attending: Hematology

## 2017-08-22 ENCOUNTER — Other Ambulatory Visit (HOSPITAL_COMMUNITY): Payer: Self-pay | Admitting: Adult Health Nurse Practitioner

## 2017-08-22 ENCOUNTER — Ambulatory Visit (HOSPITAL_COMMUNITY)
Admission: RE | Admit: 2017-08-22 | Discharge: 2017-08-22 | Disposition: A | Discharge status: Home / Self Care | Payer: PPO | Source: Ambulatory Visit | Attending: Adult Health Nurse Practitioner | Admitting: Adult Health Nurse Practitioner

## 2017-08-22 DIAGNOSIS — Z800 Family history of malignant neoplasm of digestive organs: Secondary | ICD-10-CM | POA: Diagnosis not present

## 2017-08-22 DIAGNOSIS — M810 Age-related osteoporosis without current pathological fracture: Secondary | ICD-10-CM | POA: Diagnosis not present

## 2017-08-22 DIAGNOSIS — Z79810 Long term (current) use of selective estrogen receptor modulators (SERMs): Secondary | ICD-10-CM | POA: Diagnosis not present

## 2017-08-22 DIAGNOSIS — Z6834 Body mass index (BMI) 34.0-34.9, adult: Secondary | ICD-10-CM | POA: Diagnosis not present

## 2017-08-22 DIAGNOSIS — W228XXA Striking against or struck by other objects, initial encounter: Secondary | ICD-10-CM | POA: Insufficient documentation

## 2017-08-22 DIAGNOSIS — S92351A Displaced fracture of fifth metatarsal bone, right foot, initial encounter for closed fracture: Secondary | ICD-10-CM | POA: Insufficient documentation

## 2017-08-22 DIAGNOSIS — M79671 Pain in right foot: Secondary | ICD-10-CM | POA: Insufficient documentation

## 2017-08-22 DIAGNOSIS — W19XXXA Unspecified fall, initial encounter: Secondary | ICD-10-CM

## 2017-08-22 DIAGNOSIS — M7989 Other specified soft tissue disorders: Secondary | ICD-10-CM | POA: Insufficient documentation

## 2017-08-22 DIAGNOSIS — D0512 Intraductal carcinoma in situ of left breast: Secondary | ICD-10-CM | POA: Insufficient documentation

## 2017-08-22 DIAGNOSIS — Z923 Personal history of irradiation: Secondary | ICD-10-CM | POA: Insufficient documentation

## 2017-08-22 DIAGNOSIS — Z170 Estrogen receptor positive status [ER+]: Secondary | ICD-10-CM | POA: Diagnosis not present

## 2017-08-22 LAB — CBC WITH DIFFERENTIAL/PLATELET
BASOS ABS: 0.1 10*3/uL (ref 0.0–0.1)
BASOS PCT: 1 %
EOS ABS: 0.1 10*3/uL (ref 0.0–0.7)
Eosinophils Relative: 1 %
HCT: 41.9 % (ref 36.0–46.0)
HEMOGLOBIN: 13.4 g/dL (ref 12.0–15.0)
Lymphocytes Relative: 27 %
Lymphs Abs: 2.5 10*3/uL (ref 0.7–4.0)
MCH: 28.9 pg (ref 26.0–34.0)
MCHC: 32 g/dL (ref 30.0–36.0)
MCV: 90.3 fL (ref 78.0–100.0)
MONOS PCT: 8 %
Monocytes Absolute: 0.7 10*3/uL (ref 0.1–1.0)
NEUTROS PCT: 63 %
Neutro Abs: 5.7 10*3/uL (ref 1.7–7.7)
Platelets: 243 10*3/uL (ref 150–400)
RBC: 4.64 MIL/uL (ref 3.87–5.11)
RDW: 14.7 % (ref 11.5–15.5)
WBC: 9 10*3/uL (ref 4.0–10.5)

## 2017-08-22 LAB — COMPREHENSIVE METABOLIC PANEL
ALBUMIN: 4.1 g/dL (ref 3.5–5.0)
ALK PHOS: 74 U/L (ref 38–126)
ALT: 16 U/L (ref 0–44)
ANION GAP: 8 (ref 5–15)
AST: 16 U/L (ref 15–41)
BUN: 9 mg/dL (ref 8–23)
CALCIUM: 8.9 mg/dL (ref 8.9–10.3)
CO2: 27 mmol/L (ref 22–32)
Chloride: 102 mmol/L (ref 98–111)
Creatinine, Ser: 0.77 mg/dL (ref 0.44–1.00)
GFR calc Af Amer: >60 mL/min (ref >60)
GFR calc non Af Amer: >60 mL/min (ref >60)
GLUCOSE: 105 mg/dL — AB (ref 70–99)
Potassium: 4 mmol/L (ref 3.5–5.1)
SODIUM: 137 mmol/L (ref 135–145)
Total Bilirubin: 0.6 mg/dL (ref 0.3–1.2)
Total Protein: 7.5 g/dL (ref 6.5–8.1)

## 2017-08-24 ENCOUNTER — Ambulatory Visit: Payer: PPO | Admitting: Orthopaedic Surgery

## 2017-08-24 ENCOUNTER — Encounter: Payer: Self-pay | Admitting: Orthopaedic Surgery

## 2017-08-24 VITALS — BP 139/77 | HR 91 | Temp 98.1°F | Ht 61.5 in | Wt 202.0 lb

## 2017-08-24 DIAGNOSIS — S99191A Other physeal fracture of right metatarsal, initial encounter for closed fracture: Secondary | ICD-10-CM | POA: Diagnosis not present

## 2017-08-24 NOTE — Progress Notes (Signed)
Subjective:    Patient ID: Melissa Weaver, female    DOB: 09/23/1950, 67 y.o.   MRN: [id number]  HPI She fell over the threshold to her house about 10 days ago and hurt her right foot.  It continued to bother her.  She saw Dr. Wende Neighbors two days ago and had x-rays of the right foot.  It showed: IMPRESSION: Oblique, mildly comminuted fracture, of the midportion of the fifth metatarsal, slight angulation. Soft tissue swelling.  She has been walking on it.  She is using regular sandals today.  She has soaked it in warm water with Epson salt.  She has no other injury.   Review of Systems  Constitutional: Positive for activity change.  Musculoskeletal: Positive for gait problem and joint swelling.  All other systems reviewed and are negative.  For Review of Systems, all other systems reviewed and are negative.  Past Medical History:  Diagnosis Date  . Anxiety   . Arthritis   . Depression   . HLD (hyperlipidemia)   . HTN (hypertension)   . Seizures ([place])    had a seizure as child, unknown etiology, no meds though. No seizures since 10 years.    Past Surgical History:  Procedure Laterality Date  . PARTIAL MASTECTOMY WITH NEEDLE LOCALIZATION Left 05/11/2017   Procedure: PARTIAL MASTECTOMY WITH NEEDLE LOCALIZATION;  Surgeon: Virl Cagey, MD;  Location: [facility];  Service: General;  Laterality: Left;  . TUBAL LIGATION      Current Outpatient Medications on File Prior to Visit  Medication Sig Dispense Refill  . acetaminophen (TYLENOL) 650 MG CR tablet Take 1,300 mg by mouth daily as needed for pain.    Marland Kitchen ALPRAZolam (XANAX) 0.25 MG tablet Take 0.25 mg by mouth 3 (three) times daily as needed for anxiety.     Marland Kitchen amoxicillin-clavulanate (AUGMENTIN) 875-125 MG tablet Take 1 tablet by mouth 2 (two) times daily. 14 tablet 0  . atorvastatin (LIPITOR) 20 MG tablet Take 20 mg by mouth every evening.     . cholecalciferol (VITAMIN D) 1000 units tablet Take 5,000 Units by mouth daily.      Marland Kitchen denosumab (PROLIA) 60 MG/ML SOLN injection Inject 60 mg into the skin every 6 (six) months. Administer in upper arm, thigh, or abdomen    . escitalopram (LEXAPRO) 10 MG tablet Take 10 mg by mouth daily.     . fluconazole (DIFLUCAN) 100 MG tablet Take 1 tablet (100 mg total) by mouth as directed. Take 200mg  (2 tablets) first day, then 100mg  the following days. 6 tablet 0  . lisinopril (PRINIVIL,ZESTRIL) 10 MG tablet Take 10 mg by mouth daily as needed (high bp).     . Magnesium 400 MG TABS Take 400 mg by mouth daily.    Marland Kitchen nystatin (MYCOSTATIN) 100000 UNIT/ML suspension Take 5 mLs (500,000 Units total) by mouth 4 (four) times daily. 60 mL 0  . tamoxifen (NOLVADEX) 20 MG tablet Take 1 tablet (20 mg total) by mouth daily. 90 tablet 3   No current facility-administered medications on file prior to visit.     Social History   Socioeconomic History  . Marital status: Married    Spouse name: Not on file  . Number of children: Not on file  . Years of education: Not on file  . Highest education level: Not on file  Occupational History  . Not on file  Social Needs  . Financial resource strain: Not on file  . Food insecurity:  Worry: Not on file    Inability: Not on file  . Transportation needs:    Medical: Not on file    Non-medical: Not on file  Tobacco Use  . Smoking status: Current Every Day Smoker    Packs/day: 1.00    Years: 40.00    Pack years: 40.00    Types: Cigarettes  . Smokeless tobacco: Never Used  Substance and Sexual Activity  . Alcohol use: Never    Frequency: Never  . Drug use: Never  . Sexual activity: Yes    Birth control/protection: None  Lifestyle  . Physical activity:    Days per week: Not on file    Minutes per session: Not on file  . Stress: Not on file  Relationships  . Social connections:    Talks on phone: Not on file    Gets together: Not on file    Attends religious service: Not on file    Active member of club or organization: Not on file     Attends meetings of clubs or organizations: Not on file    Relationship status: Not on file  . Intimate partner violence:    Fear of current or ex partner: Not on file    Emotionally abused: Not on file    Physically abused: Not on file    Forced sexual activity: Not on file  Other Topics Concern  . Not on file  Social History Narrative  . Not on file    Family History  Problem Relation Age of Onset  . Pancreatic cancer Mother   . Atrial fibrillation Father   . COPD Father   . Hypertension Father   . High Cholesterol Father   . Pancreatic cancer Maternal Aunt   . Pancreatic cancer Maternal Uncle     BP 139/77   Pulse 91   Temp 98.1 F (36.7 C)   Ht 5' 1.5" (1.562 m)   Wt 202 lb (91.6 kg)   BMI 37.55 kg/m   Body mass index is 37.55 kg/m.     Objective:   Physical Exam  Constitutional: She is oriented to person, place, and time. She appears well-developed and well-nourished.  HENT:  Head: Normocephalic and atraumatic.  Eyes: Pupils are equal, round, and reactive to light. Conjunctivae and EOM are normal.  Neck: Normal range of motion. Neck supple.  Cardiovascular: Normal rate, regular rhythm and intact distal pulses.  Pulmonary/Chest: Effort normal.  Abdominal: Soft.  Musculoskeletal:       Feet:  Neurological: She is alert and oriented to person, place, and time. She has normal reflexes. She displays normal reflexes. No cranial nerve deficit. She exhibits normal muscle tone. Coordination normal.  Skin: Skin is warm and dry.  Psychiatric: She has a normal mood and affect. Her behavior is normal. Judgment and thought content normal.          Assessment & Plan:   Encounter Diagnosis  Name Primary?  . Closed fracture of base of fifth metatarsal bone of right foot at metaphyseal-diaphyseal junction, initial encounter Yes   She is fitted with CAM walker.  Contrast bath sheet of instructions given.  Return in two weeks.  X-rays on return.  Call if any  problem.  Precautions discussed.   Electronically Signed Sanjuana Kava, MD 8/7/20192:29 PM

## 2017-08-25 ENCOUNTER — Inpatient Hospital Stay (HOSPITAL_BASED_OUTPATIENT_CLINIC_OR_DEPARTMENT_OTHER): Payer: PPO | Admitting: Genetic Counselor

## 2017-08-25 ENCOUNTER — Inpatient Hospital Stay (HOSPITAL_COMMUNITY): Payer: PPO

## 2017-08-25 ENCOUNTER — Inpatient Hospital Stay (HOSPITAL_BASED_OUTPATIENT_CLINIC_OR_DEPARTMENT_OTHER): Payer: PPO | Admitting: Hematology

## 2017-08-25 ENCOUNTER — Encounter (HOSPITAL_COMMUNITY): Payer: Self-pay | Admitting: Genetic Counselor

## 2017-08-25 ENCOUNTER — Other Ambulatory Visit: Payer: Self-pay

## 2017-08-25 ENCOUNTER — Encounter (HOSPITAL_COMMUNITY): Payer: Self-pay | Admitting: Hematology

## 2017-08-25 VITALS — BP 120/44 | HR 103 | Temp 99.0°F | Resp 16 | Wt 208.5 lb

## 2017-08-25 DIAGNOSIS — M810 Age-related osteoporosis without current pathological fracture: Secondary | ICD-10-CM

## 2017-08-25 DIAGNOSIS — D0512 Intraductal carcinoma in situ of left breast: Secondary | ICD-10-CM

## 2017-08-25 DIAGNOSIS — Z800 Family history of malignant neoplasm of digestive organs: Secondary | ICD-10-CM

## 2017-08-25 DIAGNOSIS — Z79810 Long term (current) use of selective estrogen receptor modulators (SERMs): Secondary | ICD-10-CM | POA: Diagnosis not present

## 2017-08-25 DIAGNOSIS — Z808 Family history of malignant neoplasm of other organs or systems: Secondary | ICD-10-CM | POA: Diagnosis not present

## 2017-08-25 DIAGNOSIS — Z923 Personal history of irradiation: Secondary | ICD-10-CM

## 2017-08-25 DIAGNOSIS — Z1379 Encounter for other screening for genetic and chromosomal anomalies: Secondary | ICD-10-CM | POA: Diagnosis not present

## 2017-08-25 DIAGNOSIS — Z170 Estrogen receptor positive status [ER+]: Secondary | ICD-10-CM

## 2017-08-25 DIAGNOSIS — Z809 Family history of malignant neoplasm, unspecified: Secondary | ICD-10-CM | POA: Diagnosis not present

## 2017-08-25 NOTE — Progress Notes (Signed)
REFERRING PROVIDER: Derek Jack, MD [address] [place], [place] [address]  PRIMARY PROVIDER:  [name], MD  PRIMARY REASON FOR VISIT:  1. Ductal carcinoma in situ (DCIS) of left breast   2. Family history of pancreatic cancer   3. Family history of colon cancer   4. Family history of melanoma      HISTORY OF PRESENT ILLNESS:   Melissa Weaver, a 67 y.o. female, was seen for a [facility] cancer genetics consultation at the request of Dr. Delton Coombes due to a personal and family history of cancer.  Melissa Weaver presents to clinic today to discuss the possibility of a hereditary predisposition to cancer, genetic testing, and to further clarify her future cancer risks, as well as potential cancer risks for family members.   In March 2019, at the age of 53, Melissa Weaver was diagnosed with DCIS of the left breast. This was treated with lumpectomy and radiation.    CANCER HISTORY:   No history exists.     HORMONAL RISK FACTORS:  Menarche was at age 70.  First live birth at age 59.  OCP use for approximately 4 years.  Ovaries intact: yes.  Hysterectomy: no.  Menopausal status: postmenopausal.  HRT use: 0 years. Colonoscopy: yes; normal. Mammogram within the last year: yes. Number of breast biopsies: 1. Up to date with pelvic exams:  yes. Any excessive radiation exposure in the past:  no  Past Medical History:  Diagnosis Date  . Anxiety   . Arthritis   . Depression   . Family history of colon cancer   . Family history of melanoma   . Family history of pancreatic cancer   . HLD (hyperlipidemia)   . HTN (hypertension)   . Seizures ([place])    had a seizure as child, unknown etiology, no meds though. No seizures since 10 years.    Past Surgical History:  Procedure Laterality Date  . PARTIAL MASTECTOMY WITH NEEDLE LOCALIZATION Left 05/11/2017   Procedure: PARTIAL MASTECTOMY WITH NEEDLE LOCALIZATION;  Surgeon: Virl Cagey, MD;  Location: [facility];  Service: General;   Laterality: Left;  . TUBAL LIGATION      Social History   Socioeconomic History  . Marital status: Married    Spouse name: Not on file  . Number of children: Not on file  . Years of education: Not on file  . Highest education level: Not on file  Occupational History  . Not on file  Social Needs  . Financial resource strain: Not on file  . Food insecurity:    Worry: Not on file    Inability: Not on file  . Transportation needs:    Medical: Not on file    Non-medical: Not on file  Tobacco Use  . Smoking status: Current Every Day Smoker    Packs/day: 1.00    Years: 40.00    Pack years: 40.00    Types: Cigarettes  . Smokeless tobacco: Never Used  Substance and Sexual Activity  . Alcohol use: Never    Frequency: Never  . Drug use: Never  . Sexual activity: Yes    Birth control/protection: None  Lifestyle  . Physical activity:    Days per week: Not on file    Minutes per session: Not on file  . Stress: Not on file  Relationships  . Social connections:    Talks on phone: Not on file    Gets together: Not on file    Attends religious service: Not  on file    Active member of club or organization: Not on file    Attends meetings of clubs or organizations: Not on file    Relationship status: Not on file  Other Topics Concern  . Not on file  Social History Narrative  . Not on file     FAMILY HISTORY:  We obtained a detailed, 4-generation family history.  Significant diagnoses are listed below: Family History  Problem Relation Age of Onset  . Pancreatic cancer Mother 70       d. 89  . Atrial fibrillation Father   . COPD Father   . Hypertension Father   . High Cholesterol Father   . Pancreatic cancer Maternal Aunt 75       d. 22  . Pancreatic cancer Maternal Uncle        dx and died in his 73s  . Lung cancer Maternal Grandfather 92       d. 67  . Heart attack Maternal Grandfather 92       d. 74  . Lung cancer Maternal Aunt   . Leukemia Maternal Uncle         dx in his 58s-40s  . Heart attack Maternal Uncle   . Melanoma Other        MGMs sister  . Colon cancer Other        MGMs mother    The patient has one son who is cancer free.  She has one sister and two brothers who do not have cancer.  Both parents are deceased.  The pateint's father died of AAA.  He had six siblings, none who had cancer.  The paternal grandparents died of natural causes.  The patien's mother was diagnosed with pancreatic cancer at age 1.  She had two sisters and three brothers.  One sistr and one brother had pancreatic cancer, one brother had leukemia and one sister had lung cancer.  The maternal granpparents are deceased.  The grandfather had lung cancer.  The grandmother did not have cancer, but she had a sister who had melanoma and a mother who had colon cancer. Melissa Weaver is unaware of previous family history of genetic testing for hereditary cancer risks. Patient's maternal ancestors are of [place] descent, and paternal ancestors are of [place] descent. There is no reported Ashkenazi Jewish ancestry. There is no known consanguinity.  GENETIC COUNSELING ASSESSMENT: Melissa Weaver is a 67 y.o. female with a personal history of breast cancer and family history of pancreatic cancer which is somewhat suggestive of a hereditary cancer syndrome and predisposition to cancer. We, therefore, discussed and recommended the following at today's visit.   DISCUSSION: We discussed that about 5-10% of breast cancer is hereditary with most cases due to BRCA mutations.  Other genes can also increase the risk for breast cancer, including ATM, PALB2 and CHEK2.  Some of these genes also increase the risk for pancreatic cancer.  Looking at the family history, we would be concerned about Lynch syndrome with the colon pancreatic, and breast cancer diagnosis, as well and CDKN2A mutations based on pancreatic cancer and melanomas.  Lastly, BRCA, ATM and PALB2 mutations could help explain the pancreatic  and breast cancers.    Based on the three family members, who are all first degree relatives to each other, having pancreatic cancer, she meets eligibility for high risk pancreatic cancer screening suing MRCP. This can be performed at [facility].  Once results are back, we can make a referral  at that time.  We reviewed the characteristics, features and inheritance patterns of hereditary cancer syndromes. We also discussed genetic testing, including the appropriate family members to test, the process of testing, insurance coverage and turn-around-time for results. We discussed the implications of a negative, positive and/or variant of uncertain significant result. We recommended Melissa Weaver pursue genetic testing for the multi-cancer gene panel. The Multi-Gene Panel offered by [facility] includes sequencing and/or deletion duplication testing of the following 84 genes: AIP, ALK, APC, ATM, AXIN2,BAP1,  BARD1, BLM, BMPR1A, BRCA1, BRCA2, BRIP1, CASR, CDC73, CDH1, CDK4, CDKN1B, CDKN1C, CDKN2A (p14ARF), CDKN2A (p16INK4a), CEBPA, CHEK2, CTNNA1, DICER1, DIS3L2, EGFR (c.2369C>T, p.Thr790Met variant only), EPCAM (Deletion/duplication testing only), FH, FLCN, GATA2, GPC3, GREM1 (Promoter region deletion/duplication testing only), HOXB13 (c.251G>A, p.Gly84Glu), HRAS, KIT, MAX, MEN1, MET, MITF (c.952G>A, p.Glu318Lys variant only), MLH1, MSH2, MSH3, MSH6, MUTYH, NBN, NF1, NF2, NTHL1, PALB2, PDGFRA, PHOX2B, PMS2, POLD1, POLE, POT1, PRKAR1A, PTCH1, PTEN, RAD50, RAD51C, RAD51D, RB1, RECQL4, RET, RUNX1, SDHAF2, SDHA (sequence changes only), SDHB, SDHC, SDHD, SMAD4, SMARCA4, SMARCB1, SMARCE1, STK11, SUFU, TERC, TERT, TMEM127, TP53, TSC1, TSC2, VHL, WRN and WT1.    Based on Melissa Weaver's personal and family history of cancer, she meets medical criteria for genetic testing. Despite that she meets criteria, she may still have an out of pocket cost. We discussed that if her out of pocket cost for testing is over $100, the  laboratory will call and confirm whether she wants to proceed with testing.  If the out of pocket cost of testing is less than $100 she will be billed by the genetic testing laboratory.   PLAN: After considering the risks, benefits, and limitations, Melissa Weaver  provided informed consent to pursue genetic testing and the blood sample was sent to [facility] for analysis of the Multicancer panel. Results should be available within approximately 2-3 weeks' time, at which point they will be disclosed by telephone to Melissa Weaver, as will any additional recommendations warranted by these results. Melissa Weaver will receive a summary of her genetic counseling visit and a copy of her results once available. This information will also be available in Epic. We encouraged Melissa Weaver to remain in contact with cancer genetics annually so that we can continuously update the family history and inform her of any changes in cancer genetics and testing that may be of benefit for her family. Melissa Weaver questions were answered to her satisfaction today. Our contact information was provided should additional questions or concerns arise.  Lastly, we encouraged Melissa Weaver to remain in contact with cancer genetics annually so that we can continuously update the family history and inform her of any changes in cancer genetics and testing that may be of benefit for this family.   Ms.  Weaver questions were answered to her satisfaction today. Our contact information was provided should additional questions or concerns arise. Thank you for the referral and allowing [place] to share in the care of your patient.   Karen P. Florene Glen, [place], [facility] Certified Genetic Counselor [email or web address] phone: [phone]  The patient was seen for a total of 60 minutes in face-to-face genetic counseling.  This patient was discussed with Drs. Magrinat, Lindi Adie and/or Burr Medico who agrees with the above.     _______________________________________________________________________ For Office Staff:  Number of people involved in session: 2 Was an Intern/ student involved with case: no

## 2017-08-25 NOTE — Assessment & Plan Note (Signed)
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 -Biopsy of the left breast on 03/31/2017, DCIS, ER/PR positive -Left lumpectomy on 05/11/2017, intermediate grade DCIS, margins negative - I have recommended risk reduction therapy with 5 years of tamoxifen/aromatase inhibitor.  Because of her osteoporosis, I chose tamoxifen. -She finished radiation therapy and started back on tamoxifen.  She is tolerating it very well.  Minor hot flashes present. - Physical examination reveals some skin breakdown from recent radiation therapy with no active redness.  No palpable mass in bilateral breasts.  No palpable axillary adenopathy. -We will schedule her next mammogram in February and see her back after that.  We will do follow-up visits once every 6 months. -Because of her family history, she underwent genetic testing today.  2.  Osteoporosis: Her last DEXA scan on 02/24/2017 shows osteoporosis with a T score of -3.2.  She is receiving Prolia through her PCP.

## 2017-08-25 NOTE — Progress Notes (Signed)
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 Riebe 67 y.o. female returns for routine follow-up for breast cancer DICS stage 0. Patient has been taking her tamoxifen as prescribed. She stopped while on radiation and started back her last day. She has a burn on her left breast from the radiation. It is healing great, slight pinkness, no drainage. Patient had a fall last Thursday and broke her right foot. She is now wearing a boot. Her appetite is 100% and she drinks 1-2 ensure a week to help maintain her weight. Her energy level is at 50%. Patient lives at home and needs no help with her daily activities. Patient denies any new pains. Denies any new lumps or bumbs felt. Patient denies any numbness or tingling in her hands or feet. Denies any nausea, vomiting, or diarrhea.     REVIEW OF SYSTEMS:  Review of Systems  All other systems reviewed and are negative.    PAST MEDICAL/SURGICAL HISTORY:  Past Medical History:  Diagnosis Date  . Anxiety   . Arthritis   . Depression   . Family history of colon cancer   . Family history of melanoma   . Family history of pancreatic cancer   . HLD (hyperlipidemia)   . HTN (hypertension)   . Seizures ([place])    had a seizure as child, unknown etiology, no meds though. No seizures since 10 years.   Past Surgical History:  Procedure Laterality Date  . PARTIAL MASTECTOMY WITH NEEDLE LOCALIZATION Left 05/11/2017   Procedure: PARTIAL MASTECTOMY WITH NEEDLE LOCALIZATION;  Surgeon: Virl Cagey, MD;  Location: [facility];  Service: General;  Laterality: Left;  . TUBAL LIGATION       SOCIAL HISTORY:  Social History   Socioeconomic History  . Marital status: Married    Spouse  name: Not on file  . Number of children: Not on file  . Years of education: Not on file  . Highest education level: Not on file  Occupational History  . Not on file  Social Needs  . Financial resource strain: Not on file  . Food insecurity:    Worry: Not on file    Inability: Not on file  . Transportation needs:    Medical: Not on file    Non-medical: Not on file  Tobacco Use  . Smoking status: Current Every Day Smoker    Packs/day: 1.00    Years: 40.00    Pack years: 40.00    Types: Cigarettes  . Smokeless tobacco: Never Used  Substance and Sexual Activity  . Alcohol use: Never    Frequency: Never  . Drug use: Never  . Sexual activity: Yes    Birth control/protection: None  Lifestyle  . Physical activity:    Days per week: Not on file    Minutes per session: Not on file  . Stress: Not on file  Relationships  . Social connections:    Talks on phone: Not on file    Gets together: Not on file    Attends religious service: Not on file    Active member of club or organization: Not on file    Attends meetings of clubs or organizations: Not on file  Relationship status: Not on file  . Intimate partner violence:    Fear of current or ex partner: Not on file    Emotionally abused: Not on file    Physically abused: Not on file    Forced sexual activity: Not on file  Other Topics Concern  . Not on file  Social History Narrative  . Not on file    FAMILY HISTORY:  Family History  Problem Relation Age of Onset  . Pancreatic cancer Mother 66       d. 39  . Atrial fibrillation Father   . COPD Father   . Hypertension Father   . High Cholesterol Father   . Pancreatic cancer Maternal Aunt 75       d. 44  . Pancreatic cancer Maternal Uncle        dx and died in his 39s  . Lung cancer Maternal Grandfather 92       d. 72  . Heart attack Maternal Grandfather 92       d. 48  . Lung cancer Maternal Aunt   . Leukemia Maternal Uncle        dx in his 42s-40s  . Heart  attack Maternal Uncle   . Melanoma Other        MGMs sister  . Colon cancer Other        MGMs mother    CURRENT MEDICATIONS:  Outpatient Encounter Medications as of 08/25/2017  Medication Sig  . acetaminophen (TYLENOL) 650 MG CR tablet Take 1,300 mg by mouth daily as needed for pain.  Marland Kitchen ALPRAZolam (XANAX) 0.25 MG tablet Take 0.25 mg by mouth 3 (three) times daily as needed for anxiety.   Marland Kitchen atorvastatin (LIPITOR) 20 MG tablet Take 20 mg by mouth every evening.   . cholecalciferol (VITAMIN D) 1000 units tablet Take 5,000 Units by mouth daily.  Marland Kitchen denosumab (PROLIA) 60 MG/ML SOLN injection Inject 60 mg into the skin every 6 (six) months. Administer in upper arm, thigh, or abdomen  . escitalopram (LEXAPRO) 10 MG tablet Take 10 mg by mouth daily.   Marland Kitchen lisinopril (PRINIVIL,ZESTRIL) 10 MG tablet Take 10 mg by mouth daily as needed (high bp).   . Magnesium 400 MG TABS Take 200 mg by mouth daily.   . tamoxifen (NOLVADEX) 20 MG tablet Take 1 tablet (20 mg total) by mouth daily.  . [DISCONTINUED] amoxicillin-clavulanate (AUGMENTIN) 875-125 MG tablet Take 1 tablet by mouth 2 (two) times daily.  . [DISCONTINUED] fluconazole (DIFLUCAN) 100 MG tablet Take 1 tablet (100 mg total) by mouth as directed. Take 200mg  (2 tablets) first day, then 100mg  the following days.  . [DISCONTINUED] nystatin (MYCOSTATIN) 100000 UNIT/ML suspension Take 5 mLs (500,000 Units total) by mouth 4 (four) times daily.   No facility-administered encounter medications on file as of 08/25/2017.     ALLERGIES:  No Known Allergies   PHYSICAL EXAM:  ECOG Performance status: 1  Vitals:   08/25/17 1538  BP: (!) 120/44  Pulse: (!) 103  Resp: 16  Temp: 99 F (37.2 C)  SpO2: 95%   Filed Weights   08/25/17 1538  Weight: 208 lb 8 oz (94.6 kg)    Physical Exam Breast exam: Left breast upper outer quadrant scar shows some skin breakdown from recent radiation therapy.  No palpable masses in bilateral breast.  No palpable axillary  adenopathy. Abdomen: No palpable hepatospleno megaly or other masses. Extremities: Right leg in a air cast.  Left leg has no edema.  LABORATORY  DATA:  I have reviewed the labs as listed.  CBC    Component Value Date/Time   WBC 9.0 08/22/2017 1151   RBC 4.64 08/22/2017 1151   HGB 13.4 08/22/2017 1151   HCT 41.9 08/22/2017 1151   PLT 243 08/22/2017 1151   MCV 90.3 08/22/2017 1151   MCH 28.9 08/22/2017 1151   MCHC 32.0 08/22/2017 1151   RDW 14.7 08/22/2017 1151   LYMPHSABS 2.5 08/22/2017 1151   MONOABS 0.7 08/22/2017 1151   EOSABS 0.1 08/22/2017 1151   BASOSABS 0.1 08/22/2017 1151   CMP Latest Ref Rng & Units 08/22/2017 05/05/2017 10/06/2007  Glucose 70 - 99 mg/dL 105(H) 111(H) 106(H)  BUN 8 - 23 mg/dL 9 9 6   Creatinine 0.44 - 1.00 mg/dL 0.77 0.67 0.62  Sodium 135 - 145 mmol/L 137 138 142  Potassium 3.5 - 5.1 mmol/L 4.0 4.3 3.8  Chloride 98 - 111 mmol/L 102 103 106  CO2 22 - 32 mmol/L 27 24 26   Calcium 8.9 - 10.3 mg/dL 8.9 9.1 9.8  Total Protein 6.5 - 8.1 g/dL 7.5 - -  Total Bilirubin 0.3 - 1.2 mg/dL 0.6 - -  Alkaline Phos 38 - 126 U/L 74 - -  AST 15 - 41 U/L 16 - -  ALT 0 - 44 U/L 16 - -        ASSESSMENT & PLAN:   DCIS (ductal carcinoma in situ) 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 -Biopsy of the left breast on 03/31/2017, DCIS, ER/PR positive -Left lumpectomy on 05/11/2017, intermediate grade DCIS, margins negative - I have recommended risk reduction therapy with 5 years of tamoxifen/aromatase inhibitor.  Because of her osteoporosis, I chose tamoxifen. -She finished radiation therapy and started back on tamoxifen.  She is tolerating it very well.  Minor hot flashes present. - Physical examination reveals some skin breakdown from recent radiation therapy with no active redness.  No palpable mass in bilateral breasts.  No palpable axillary adenopathy. -We will schedule her next mammogram in February and see her back  after that.  We will do follow-up visits once every 6 months. -Because of her family history, she underwent genetic testing today.  2.  Osteoporosis: Her last DEXA scan on 02/24/2017 shows osteoporosis with a T score of -3.2.  She is receiving Prolia through her PCP.      Orders placed this encounter:  Orders Placed This Encounter  Procedures  . MM Digital Diagnostic Bilat  . CBC with Differential/Platelet  . Comprehensive metabolic panel      Derek Jack, MD [place] [phone]

## 2017-08-25 NOTE — Patient Instructions (Signed)
[place] at [facility] Discharge Instructions  Follow up with [place] in feb after your Mammogram.    Thank you for choosing [place] at [facility] to provide your oncology and hematology care.  To afford each patient quality time with our provider, please arrive at least 15 minutes before your scheduled appointment time.   If you have a lab appointment with the [place] please come in thru the  Main Entrance and check in at the main information desk  You need to re-schedule your appointment should you arrive 10 or more minutes late.  We strive to give you quality time with our providers, and arriving late affects you and other patients whose appointments are after yours.  Also, if you no show three or more times for appointments you may be dismissed from the clinic at the providers discretion.     Again, thank you for choosing [facility].  Our hope is that these requests will decrease the amount of time that you wait before being seen by our physicians.       _____________________________________________________________  Should you have questions after your visit to [facility], please contact our office at [phone] between the hours of 8:00 a.m. and 4:30 p.m.  Voicemails left after 4:00 p.m. will not be returned until the following business day.  For prescription refill requests, have your pharmacy contact our office and allow 72 hours.    Cancer Center Support Programs:   > Cancer Support Group  2nd Tuesday of the month 1pm-2pm, Journey Room

## 2017-08-26 ENCOUNTER — Telehealth: Payer: Self-pay | Admitting: Orthopaedic Surgery

## 2017-08-26 NOTE — Telephone Encounter (Signed)
Pt's sister called this morning.  She stated that [place] needs to know whether or not to blow up the cam walker.  I told her that I would have to let the clinical staff advise on that.  Would you please call [name] at her home phone number,[phone]  Thanks

## 2017-08-29 ENCOUNTER — Ambulatory Visit (HOSPITAL_COMMUNITY): Payer: PPO | Admitting: Hematology

## 2017-08-29 NOTE — Telephone Encounter (Signed)
No need to inflate unless it is rubbing, I have called to advise. She voiced understanding.

## 2017-08-31 DIAGNOSIS — C50912 Malignant neoplasm of unspecified site of left female breast: Secondary | ICD-10-CM | POA: Diagnosis not present

## 2017-08-31 DIAGNOSIS — D0512 Intraductal carcinoma in situ of left breast: Secondary | ICD-10-CM | POA: Diagnosis not present

## 2017-08-31 DIAGNOSIS — Z510 Encounter for antineoplastic radiation therapy: Secondary | ICD-10-CM | POA: Diagnosis not present

## 2017-09-05 ENCOUNTER — Telehealth: Payer: Self-pay | Admitting: Genetic Counselor

## 2017-09-05 NOTE — Telephone Encounter (Signed)
LM on home and mobile number that results were back and to please call.

## 2017-09-06 ENCOUNTER — Ambulatory Visit: Payer: Self-pay | Admitting: Genetic Counselor

## 2017-09-06 ENCOUNTER — Encounter: Payer: Self-pay | Admitting: Genetic Counselor

## 2017-09-06 DIAGNOSIS — D0512 Intraductal carcinoma in situ of left breast: Secondary | ICD-10-CM

## 2017-09-06 DIAGNOSIS — Z1379 Encounter for other screening for genetic and chromosomal anomalies: Secondary | ICD-10-CM | POA: Insufficient documentation

## 2017-09-06 DIAGNOSIS — Z800 Family history of malignant neoplasm of digestive organs: Secondary | ICD-10-CM

## 2017-09-06 NOTE — Telephone Encounter (Signed)
Revealed negative genetic testing.  Discussed that we do not know why she has breast cancer or why there is cancer in the family. It could be due to a different gene that we are not testing, or maybe our current technology may not be able to pick something up.  It will be important for her to keep in contact with genetics to keep up with whether additional testing may be needed.  Offered to refer to the high risk pancreatic cancer program at [facility].  Patient wants to complete her breast cancer treatment prior to being referred.  Will put in my letter to Dr. Delton Coombes.

## 2017-09-06 NOTE — Progress Notes (Signed)
HPI:  Ms. Dula was previously seen in the [place] clinic due to a personal and family history of cancer and concerns regarding a hereditary predisposition to cancer. Please refer to our prior cancer genetics clinic note for more information regarding Ms. Pavlock's medical, social and family histories, and our assessment and recommendations, at the time. Ms. Benbrook recent genetic test results were disclosed to her, as were recommendations warranted by these results. These results and recommendations are discussed in more detail below.  CANCER HISTORY:    DCIS (ductal carcinoma in situ)   04/12/2017 Initial Diagnosis    DCIS (ductal carcinoma in situ)    09/03/2017 Genetic Testing    Negative genetic testing on the multi-cancer panel.  The Multi-Gene Panel offered by [facility] includes sequencing and/or deletion duplication testing of the following 84 genes: AIP, ALK, APC, ATM, AXIN2,BAP1,  BARD1, BLM, BMPR1A, BRCA1, BRCA2, BRIP1, CASR, CDC73, CDH1, CDK4, CDKN1B, CDKN1C, CDKN2A (p14ARF), CDKN2A (p16INK4a), CEBPA, CHEK2, CTNNA1, DICER1, DIS3L2, EGFR (c.2369C>T, p.Thr790Met variant only), EPCAM (Deletion/duplication testing only), FH, FLCN, GATA2, GPC3, GREM1 (Promoter region deletion/duplication testing only), HOXB13 (c.251G>A, p.Gly84Glu), HRAS, KIT, MAX, MEN1, MET, MITF (c.952G>A, p.Glu318Lys variant only), MLH1, MSH2, MSH3, MSH6, MUTYH, NBN, NF1, NF2, NTHL1, PALB2, PDGFRA, PHOX2B, PMS2, POLD1, POLE, POT1, PRKAR1A, PTCH1, PTEN, RAD50, RAD51C, RAD51D, RB1, RECQL4, RET, RUNX1, SDHAF2, SDHA (sequence changes only), SDHB, SDHC, SDHD, SMAD4, SMARCA4, SMARCB1, SMARCE1, STK11, SUFU, TERC, TERT, TMEM127, TP53, TSC1, TSC2, VHL, WRN and WT1.  The report date is September 03, 2017.     FAMILY HISTORY:  We obtained a detailed, 4-generation family history.  Significant diagnoses are listed below: Family History  Problem Relation Age of Onset  . Pancreatic cancer Mother 46       d. 32  .  Atrial fibrillation Father   . COPD Father   . Hypertension Father   . High Cholesterol Father   . Pancreatic cancer Maternal Aunt 75       d. 68  . Pancreatic cancer Maternal Uncle        dx and died in his 74s  . Lung cancer Maternal Grandfather 92       d. 66  . Heart attack Maternal Grandfather 92       d. 58  . Lung cancer Maternal Aunt   . Leukemia Maternal Uncle        dx in his 59s-40s  . Heart attack Maternal Uncle   . Melanoma Other        MGMs sister  . Colon cancer Other        MGMs mother    The patient has one son who is cancer free.  She has one sister and two brothers who do not have cancer.  Both parents are deceased.  The patient's father died of AAA.  He had six siblings, none who had cancer.  The paternal grandparents died of natural causes.  The patient's mother was diagnosed with pancreatic cancer at age 57.  She had two sisters and three brothers.  One sister and one brother had pancreatic cancer, one brother had leukemia and one sister had lung cancer.  The maternal grandparents are deceased.  The grandfather had lung cancer.  The grandmother did not have cancer, but she had a sister who had melanoma and a mother who had colon cancer.  Ms. Schlichting is unaware of previous family history of genetic testing for hereditary cancer risks. Patient's maternal ancestors are of [place] descent, and paternal ancestors  are of [place] descent. There is no reported Ashkenazi Jewish ancestry. There is no known consanguinity.  GENETIC TEST RESULTS: Genetic testing reported out on September 03, 2017 through the multi-cancer panel found no deleterious mutations.  The Multi-Gene Panel offered by [facility] includes sequencing and/or deletion duplication testing of the following 84 genes: AIP, ALK, APC, ATM, AXIN2,BAP1,  BARD1, BLM, BMPR1A, BRCA1, BRCA2, BRIP1, CASR, CDC73, CDH1, CDK4, CDKN1B, CDKN1C, CDKN2A (p14ARF), CDKN2A (p16INK4a), CEBPA, CHEK2, CTNNA1, DICER1, DIS3L2, EGFR (c.2369C>T,  p.Thr790Met variant only), EPCAM (Deletion/duplication testing only), FH, FLCN, GATA2, GPC3, GREM1 (Promoter region deletion/duplication testing only), HOXB13 (c.251G>A, p.Gly84Glu), HRAS, KIT, MAX, MEN1, MET, MITF (c.952G>A, p.Glu318Lys variant only), MLH1, MSH2, MSH3, MSH6, MUTYH, NBN, NF1, NF2, NTHL1, PALB2, PDGFRA, PHOX2B, PMS2, POLD1, POLE, POT1, PRKAR1A, PTCH1, PTEN, RAD50, RAD51C, RAD51D, RB1, RECQL4, RET, RUNX1, SDHAF2, SDHA (sequence changes only), SDHB, SDHC, SDHD, SMAD4, SMARCA4, SMARCB1, SMARCE1, STK11, SUFU, TERC, TERT, TMEM127, TP53, TSC1, TSC2, VHL, WRN and WT1.  The test report has been scanned into EPIC and is located under the Molecular Pathology section of the Results Review tab.    We discussed with Ms. Collet that since the current genetic testing is not perfect, it is possible there may be a gene mutation in one of these genes that current testing cannot detect, but that chance is small.  We also discussed, that it is possible that another gene that has not yet been discovered, or that we have not yet tested, is responsible for the cancer diagnoses in the family, and it is, therefore, important to remain in touch with cancer genetics in the future so that we can continue to offer Ms. Guadalupe the most up to date genetic testing.   CANCER SCREENING RECOMMENDATIONS: This result is reassuring and indicates that Ms. Delude likely does not have an increased risk for a future cancer due to a mutation in one of these genes. This normal test also suggests that Ms. Koons's cancer was most likely not due to an inherited predisposition associated with one of these genes.  Most cancers happen by chance and this negative test suggests that her cancer falls into this category.  We, therefore, recommended she continue to follow the cancer management and screening guidelines provided by her oncology and primary healthcare provider.   An individual's cancer risk and medical management are not  determined by genetic test results alone. Overall cancer risk assessment incorporates additional factors, including personal medical history, family history, and any available genetic information that may result in a personalized plan for cancer prevention and surveillance.   RECOMMENDATIONS FOR FAMILY MEMBERS:  Women in this family might be at some increased risk of developing cancer, over the general population risk, simply due to the family history of cancer.  We recommended women in this family have a yearly mammogram beginning at age 46, or 3 years younger than the earliest onset of cancer, an annual clinical breast exam, and perform monthly breast self-exams. Women in this family should also have a gynecological exam as recommended by their primary provider. All family members should have a colonoscopy by age 101.  According to the International Cancer of the Pancreas Screening (CAPS) Consortium, individuals with three or more blood relatives with pancreatic cancer, with at least one affected first-degree relatives, should be considered for screening.  Therefore, we discussed referring Ms. Criss to the [facility] High Risk Pancreatic Cancer Screening Program.  Ms. Bazar decided to wait on this referral until her breast cancer treatment is completed.  After  that, she may be referred to Dr. Truitt Merle at the [facility] for discussion of high risk screening.  FOLLOW-UP: Lastly, we discussed with Ms. Amparo that cancer genetics is a rapidly advancing field and it is possible that new genetic tests will be appropriate for her and/or her family members in the future. We encouraged her to remain in contact with cancer genetics on an annual basis so we can update her personal and family histories and let her know of advances in cancer genetics that may benefit this family.   Our contact number was provided. Ms. Twitty questions were answered to her satisfaction, and she knows she is  welcome to call us at anytime with additional questions or concerns.   Roma Kayser, MS, [facility] Certified Genetic Counselor [email or web address]

## 2017-09-07 ENCOUNTER — Ambulatory Visit (INDEPENDENT_AMBULATORY_CARE_PROVIDER_SITE_OTHER): Payer: PPO

## 2017-09-07 ENCOUNTER — Encounter: Payer: Self-pay | Admitting: Orthopaedic Surgery

## 2017-09-07 ENCOUNTER — Ambulatory Visit (INDEPENDENT_AMBULATORY_CARE_PROVIDER_SITE_OTHER): Payer: PPO | Admitting: Orthopaedic Surgery

## 2017-09-07 DIAGNOSIS — S99191D Other physeal fracture of right metatarsal, subsequent encounter for fracture with routine healing: Secondary | ICD-10-CM

## 2017-09-07 NOTE — Patient Instructions (Signed)
Steps to Quit Smoking Smoking tobacco can be bad for your health. It can also affect almost every organ in your body. Smoking puts you and people around you at risk for many serious long-lasting (chronic) diseases. Quitting smoking is hard, but it is one of the best things that you can do for your health. It is never too late to quit. What are the benefits of quitting smoking? When you quit smoking, you lower your risk for getting serious diseases and conditions. They can include:  Lung cancer or lung disease.  Heart disease.  Stroke.  Heart attack.  Not being able to have children (infertility).  Weak bones (osteoporosis) and broken bones (fractures).  If you have coughing, wheezing, and shortness of breath, those symptoms may get better when you quit. You may also get sick less often. If you are pregnant, quitting smoking can help to lower your chances of having a baby of low birth weight. What can I do to help me quit smoking? Talk with your doctor about what can help you quit smoking. Some things you can do (strategies) include:  Quitting smoking totally, instead of slowly cutting back how much you smoke over a period of time.  Going to in-person counseling. You are more likely to quit if you go to many counseling sessions.  Using resources and support systems, such as: ? Online chats with a counselor. ? Phone quitlines. ? Printed self-help materials. ? Support groups or group counseling. ? Text messaging programs. ? Mobile phone apps or applications.  Taking medicines. Some of these medicines may have nicotine in them. If you are pregnant or breastfeeding, do not take any medicines to quit smoking unless your doctor says it is okay. Talk with your doctor about counseling or other things that can help you.  Talk with your doctor about using more than one strategy at the same time, such as taking medicines while you are also going to in-person counseling. This can help make  quitting easier. What things can I do to make it easier to quit? Quitting smoking might feel very hard at first, but there is a lot that you can do to make it easier. Take these steps:  Talk to your family and friends. Ask them to support and encourage you.  Call phone quitlines, reach out to support groups, or work with a counselor.  Ask people who smoke to not smoke around you.  Avoid places that make you want (trigger) to smoke, such as: ? Bars. ? Parties. ? Smoke-break areas at work.  Spend time with people who do not smoke.  Lower the stress in your life. Stress can make you want to smoke. Try these things to help your stress: ? Getting regular exercise. ? Deep-breathing exercises. ? Yoga. ? Meditating. ? Doing a body scan. To do this, close your eyes, focus on one area of your body at a time from head to toe, and notice which parts of your body are tense. Try to relax the muscles in those areas.  Download or buy apps on your mobile phone or tablet that can help you stick to your quit plan. There are many free apps, such as QuitGuide from the CDC (Centers for Disease Control and Prevention). You can find more support from smokefree.gov and other websites.  This information is not intended to replace advice given to you by your health care provider. Make sure you discuss any questions you have with your health care provider. Document Released: 10/31/2008 Document   Revised: 09/02/2015 Document Reviewed: 05/21/2014 Elsevier Interactive Patient Education  2018 Elsevier Inc.  

## 2017-09-07 NOTE — Progress Notes (Signed)
CC:  My foot hurts still  She has been using the CAM walker.  She still has pain at times but it is less.  NV intact.  She has tenderness of the distal fifth metatarsal.    X-rays were done and reported separately of the right foot.  Encounter Diagnosis  Name Primary?  . Closed fracture of base of fifth metatarsal bone of right foot at metaphyseal-diaphyseal junction with routine healing, subsequent encounter Yes   She can gradually come out of the CAM walker at home but use it when out of the house.  Return in two weeks.  X-rays of the right foot on return.  Call if any problem.  Precautions discussed.   Electronically Signed Sanjuana Kava, MD 8/21/20192:13 PM

## 2017-09-14 ENCOUNTER — Encounter (HOSPITAL_COMMUNITY)
Admission: RE | Admit: 2017-09-14 | Discharge: 2017-09-14 | Disposition: A | Discharge status: Home / Self Care | Payer: PPO | Source: Ambulatory Visit | Attending: Internal Medicine | Admitting: Internal Medicine

## 2017-09-14 DIAGNOSIS — M810 Age-related osteoporosis without current pathological fracture: Secondary | ICD-10-CM | POA: Insufficient documentation

## 2017-09-14 MED ORDER — DENOSUMAB 60 MG/ML ~~LOC~~ SOSY
60.0000 mg | PREFILLED_SYRINGE | Freq: Once | SUBCUTANEOUS | Status: AC
  Administered 2017-09-14: 60 mg via SUBCUTANEOUS
  Filled 2017-09-14: qty 1

## 2017-09-21 ENCOUNTER — Encounter: Payer: Self-pay | Admitting: Orthopaedic Surgery

## 2017-09-21 ENCOUNTER — Ambulatory Visit (INDEPENDENT_AMBULATORY_CARE_PROVIDER_SITE_OTHER): Payer: PPO | Admitting: Orthopaedic Surgery

## 2017-09-21 ENCOUNTER — Ambulatory Visit (INDEPENDENT_AMBULATORY_CARE_PROVIDER_SITE_OTHER): Payer: PPO

## 2017-09-21 DIAGNOSIS — S99191D Other physeal fracture of right metatarsal, subsequent encounter for fracture with routine healing: Secondary | ICD-10-CM

## 2017-09-21 NOTE — Progress Notes (Signed)
CC:  My foot just has some slight soreness  She is wearing sandals today.  She is walking well.  NV intact.  X-rays were done of the right foot, reported separately.  Encounter Diagnosis  Name Primary?  . Closed fracture of base of fifth metatarsal bone of right foot at metaphyseal-diaphyseal junction with routine healing, subsequent encounter Yes   I will see her in one month.  X-rays of the right foot then.  Call if any problem.  Precautions discussed.   Electronically Signed Sanjuana Kava, MD 9/4/20192:42 PM

## 2017-10-19 ENCOUNTER — Ambulatory Visit (INDEPENDENT_AMBULATORY_CARE_PROVIDER_SITE_OTHER): Payer: PPO | Admitting: Orthopaedic Surgery

## 2017-10-19 ENCOUNTER — Ambulatory Visit (INDEPENDENT_AMBULATORY_CARE_PROVIDER_SITE_OTHER): Payer: PPO

## 2017-10-19 ENCOUNTER — Encounter: Payer: Self-pay | Admitting: Orthopaedic Surgery

## 2017-10-19 DIAGNOSIS — S99191D Other physeal fracture of right metatarsal, subsequent encounter for fracture with routine healing: Secondary | ICD-10-CM

## 2017-10-19 NOTE — Progress Notes (Signed)
CC:  My foot is better  She has on sandals and is walking well.  She has some slight tenderness of the right foot at times.  NV intact.  Gait is normal.  X-rays were done of the right foot, reported separately.  Diagnosis: Fracture right foot distally of the fifth metatarsal, nondisplaced, healing.  I will see her as needed.  Call if any problem.  Precautions discussed.   Electronically Signed Sanjuana Kava, MD 10/2/20192:22 PM

## 2017-11-10 DIAGNOSIS — I10 Essential (primary) hypertension: Secondary | ICD-10-CM | POA: Diagnosis not present

## 2017-11-10 DIAGNOSIS — E782 Mixed hyperlipidemia: Secondary | ICD-10-CM | POA: Diagnosis not present

## 2017-11-11 ENCOUNTER — Other Ambulatory Visit (HOSPITAL_COMMUNITY): Payer: Self-pay | Admitting: *Deleted

## 2017-11-11 DIAGNOSIS — D0512 Intraductal carcinoma in situ of left breast: Secondary | ICD-10-CM

## 2017-11-11 MED ORDER — TAMOXIFEN CITRATE 20 MG PO TABS: 20.0000 mg | ORAL_TABLET | Freq: Every day | ORAL | 3 refills | Status: DC

## 2017-12-05 DIAGNOSIS — E782 Mixed hyperlipidemia: Secondary | ICD-10-CM | POA: Diagnosis not present

## 2017-12-05 DIAGNOSIS — I10 Essential (primary) hypertension: Secondary | ICD-10-CM | POA: Diagnosis not present

## 2017-12-05 DIAGNOSIS — F419 Anxiety disorder, unspecified: Secondary | ICD-10-CM | POA: Diagnosis not present

## 2017-12-09 DIAGNOSIS — I10 Essential (primary) hypertension: Secondary | ICD-10-CM | POA: Diagnosis not present

## 2017-12-09 DIAGNOSIS — E875 Hyperkalemia: Secondary | ICD-10-CM | POA: Diagnosis not present

## 2017-12-09 DIAGNOSIS — M810 Age-related osteoporosis without current pathological fracture: Secondary | ICD-10-CM | POA: Diagnosis not present

## 2017-12-09 DIAGNOSIS — R7301 Impaired fasting glucose: Secondary | ICD-10-CM | POA: Diagnosis not present

## 2017-12-09 DIAGNOSIS — E782 Mixed hyperlipidemia: Secondary | ICD-10-CM | POA: Diagnosis not present

## 2017-12-12 DIAGNOSIS — M810 Age-related osteoporosis without current pathological fracture: Secondary | ICD-10-CM | POA: Diagnosis not present

## 2017-12-12 DIAGNOSIS — R7301 Impaired fasting glucose: Secondary | ICD-10-CM | POA: Diagnosis not present

## 2017-12-12 DIAGNOSIS — I10 Essential (primary) hypertension: Secondary | ICD-10-CM | POA: Diagnosis not present

## 2017-12-12 DIAGNOSIS — F419 Anxiety disorder, unspecified: Secondary | ICD-10-CM | POA: Diagnosis not present

## 2017-12-12 DIAGNOSIS — F17200 Nicotine dependence, unspecified, uncomplicated: Secondary | ICD-10-CM | POA: Diagnosis not present

## 2017-12-12 DIAGNOSIS — Z853 Personal history of malignant neoplasm of breast: Secondary | ICD-10-CM | POA: Diagnosis not present

## 2017-12-12 DIAGNOSIS — Z23 Encounter for immunization: Secondary | ICD-10-CM | POA: Diagnosis not present

## 2017-12-12 DIAGNOSIS — Z0000 Encounter for general adult medical examination without abnormal findings: Secondary | ICD-10-CM | POA: Diagnosis not present

## 2017-12-12 DIAGNOSIS — E782 Mixed hyperlipidemia: Secondary | ICD-10-CM | POA: Diagnosis not present

## 2018-01-23 DIAGNOSIS — F419 Anxiety disorder, unspecified: Secondary | ICD-10-CM | POA: Diagnosis not present

## 2018-01-23 DIAGNOSIS — E782 Mixed hyperlipidemia: Secondary | ICD-10-CM | POA: Diagnosis not present

## 2018-01-23 DIAGNOSIS — I10 Essential (primary) hypertension: Secondary | ICD-10-CM | POA: Diagnosis not present

## 2018-02-01 DIAGNOSIS — L309 Dermatitis, unspecified: Secondary | ICD-10-CM | POA: Diagnosis not present

## 2018-02-01 DIAGNOSIS — D0512 Intraductal carcinoma in situ of left breast: Secondary | ICD-10-CM | POA: Diagnosis not present

## 2018-03-08 ENCOUNTER — Other Ambulatory Visit (HOSPITAL_COMMUNITY): Payer: Self-pay | Admitting: Nurse Practitioner

## 2018-03-08 DIAGNOSIS — Z853 Personal history of malignant neoplasm of breast: Secondary | ICD-10-CM

## 2018-03-15 DIAGNOSIS — E782 Mixed hyperlipidemia: Secondary | ICD-10-CM | POA: Diagnosis not present

## 2018-03-15 DIAGNOSIS — I10 Essential (primary) hypertension: Secondary | ICD-10-CM | POA: Diagnosis not present

## 2018-03-15 DIAGNOSIS — F419 Anxiety disorder, unspecified: Secondary | ICD-10-CM | POA: Diagnosis not present

## 2018-03-17 ENCOUNTER — Encounter (HOSPITAL_COMMUNITY): Payer: Self-pay

## 2018-03-17 ENCOUNTER — Encounter (HOSPITAL_COMMUNITY)
Admission: RE | Admit: 2018-03-17 | Discharge: 2018-03-17 | Disposition: A | Discharge status: Home / Self Care | Payer: PPO | Source: Ambulatory Visit | Attending: Internal Medicine | Admitting: Internal Medicine

## 2018-03-17 DIAGNOSIS — M810 Age-related osteoporosis without current pathological fracture: Secondary | ICD-10-CM | POA: Diagnosis not present

## 2018-03-17 MED ORDER — DENOSUMAB 60 MG/ML ~~LOC~~ SOSY
60.0000 mg | PREFILLED_SYRINGE | Freq: Once | SUBCUTANEOUS | Status: AC
  Administered 2018-03-17: 60 mg via SUBCUTANEOUS

## 2018-03-21 ENCOUNTER — Ambulatory Visit (HOSPITAL_COMMUNITY): Payer: PPO

## 2018-03-21 ENCOUNTER — Ambulatory Visit (HOSPITAL_COMMUNITY)
Admission: RE | Admit: 2018-03-21 | Discharge: 2018-03-21 | Disposition: A | Discharge status: Home / Self Care | Payer: PPO | Source: Ambulatory Visit | Attending: Nurse Practitioner | Admitting: Nurse Practitioner

## 2018-03-21 ENCOUNTER — Other Ambulatory Visit: Payer: Self-pay

## 2018-03-21 ENCOUNTER — Inpatient Hospital Stay (HOSPITAL_COMMUNITY): Payer: PPO | Attending: Hematology

## 2018-03-21 DIAGNOSIS — M810 Age-related osteoporosis without current pathological fracture: Secondary | ICD-10-CM | POA: Diagnosis not present

## 2018-03-21 DIAGNOSIS — Z79899 Other long term (current) drug therapy: Secondary | ICD-10-CM | POA: Insufficient documentation

## 2018-03-21 DIAGNOSIS — Z79810 Long term (current) use of selective estrogen receptor modulators (SERMs): Secondary | ICD-10-CM | POA: Insufficient documentation

## 2018-03-21 DIAGNOSIS — I890 Lymphedema, not elsewhere classified: Secondary | ICD-10-CM | POA: Diagnosis not present

## 2018-03-21 DIAGNOSIS — D0512 Intraductal carcinoma in situ of left breast: Secondary | ICD-10-CM

## 2018-03-21 DIAGNOSIS — Z170 Estrogen receptor positive status [ER+]: Secondary | ICD-10-CM | POA: Insufficient documentation

## 2018-03-21 DIAGNOSIS — Z923 Personal history of irradiation: Secondary | ICD-10-CM | POA: Diagnosis not present

## 2018-03-21 DIAGNOSIS — R928 Other abnormal and inconclusive findings on diagnostic imaging of breast: Secondary | ICD-10-CM | POA: Diagnosis not present

## 2018-03-21 LAB — COMPREHENSIVE METABOLIC PANEL
ALT: 16 U/L (ref 0–44)
AST: 17 U/L (ref 15–41)
Albumin: 4 g/dL (ref 3.5–5.0)
Alkaline Phosphatase: 55 U/L (ref 38–126)
Anion gap: 9 (ref 5–15)
BUN: 8 mg/dL (ref 8–23)
CO2: 24 mmol/L (ref 22–32)
Calcium: 8.7 mg/dL — ABNORMAL LOW (ref 8.9–10.3)
Chloride: 105 mmol/L (ref 98–111)
Creatinine, Ser: 0.75 mg/dL (ref 0.44–1.00)
GFR calc Af Amer: >60 mL/min (ref >60)
GFR calc non Af Amer: >60 mL/min (ref >60)
Glucose, Bld: 107 mg/dL — ABNORMAL HIGH (ref 70–99)
Potassium: 4.2 mmol/L (ref 3.5–5.1)
Sodium: 138 mmol/L (ref 135–145)
Total Bilirubin: 0.6 mg/dL (ref 0.3–1.2)
Total Protein: 7.3 g/dL (ref 6.5–8.1)

## 2018-03-21 LAB — CBC WITH DIFFERENTIAL/PLATELET
Abs Immature Granulocytes: 0.05 10*3/uL (ref 0.00–0.07)
Basophils Absolute: 0.1 10*3/uL (ref 0.0–0.1)
Basophils Relative: 1 %
Eosinophils Absolute: 0.1 10*3/uL (ref 0.0–0.5)
Eosinophils Relative: 1 %
HCT: 44.2 % (ref 36.0–46.0)
Hemoglobin: 13.5 g/dL (ref 12.0–15.0)
IMMATURE GRANULOCYTES: 1 %
Lymphocytes Relative: 36 %
Lymphs Abs: 3.6 10*3/uL (ref 0.7–4.0)
MCH: 28.1 pg (ref 26.0–34.0)
MCHC: 30.5 g/dL (ref 30.0–36.0)
MCV: 91.9 fL (ref 80.0–100.0)
Monocytes Absolute: 0.8 10*3/uL (ref 0.1–1.0)
Monocytes Relative: 8 %
Neutro Abs: 5.4 10*3/uL (ref 1.7–7.7)
Neutrophils Relative %: 53 %
Platelets: 297 10*3/uL (ref 150–400)
RBC: 4.81 MIL/uL (ref 3.87–5.11)
RDW: 13.6 % (ref 11.5–15.5)
WBC: 10.1 10*3/uL (ref 4.0–10.5)
nRBC: 0 % (ref 0.0–0.2)

## 2018-03-23 ENCOUNTER — Inpatient Hospital Stay (HOSPITAL_BASED_OUTPATIENT_CLINIC_OR_DEPARTMENT_OTHER): Payer: PPO | Admitting: Hematology

## 2018-03-23 ENCOUNTER — Other Ambulatory Visit: Payer: Self-pay

## 2018-03-23 ENCOUNTER — Encounter (HOSPITAL_COMMUNITY): Payer: Self-pay | Admitting: Hematology

## 2018-03-23 VITALS — BP 132/65 | HR 85 | Temp 98.0°F | Ht 64.0 in | Wt 193.6 lb

## 2018-03-23 DIAGNOSIS — M810 Age-related osteoporosis without current pathological fracture: Secondary | ICD-10-CM | POA: Diagnosis not present

## 2018-03-23 DIAGNOSIS — Z923 Personal history of irradiation: Secondary | ICD-10-CM | POA: Diagnosis not present

## 2018-03-23 DIAGNOSIS — D0512 Intraductal carcinoma in situ of left breast: Secondary | ICD-10-CM | POA: Diagnosis not present

## 2018-03-23 DIAGNOSIS — Z79899 Other long term (current) drug therapy: Secondary | ICD-10-CM | POA: Diagnosis not present

## 2018-03-23 DIAGNOSIS — Z79810 Long term (current) use of selective estrogen receptor modulators (SERMs): Secondary | ICD-10-CM | POA: Diagnosis not present

## 2018-03-23 DIAGNOSIS — I890 Lymphedema, not elsewhere classified: Secondary | ICD-10-CM

## 2018-03-23 DIAGNOSIS — Z170 Estrogen receptor positive status [ER+]: Secondary | ICD-10-CM | POA: Diagnosis not present

## 2018-03-23 NOTE — Assessment & Plan Note (Addendum)
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 -Biopsy of the left breast on 03/31/2017, DCIS, ER/PR positive -Left lumpectomy on 05/11/2017, intermediate grade DCIS, margins negative - I have recommended risk reduction therapy with 5 years of tamoxifen/aromatase inhibitor.  Because of her osteoporosis, I chose tamoxifen. -She was started on tamoxifen after radiation therapy was completed. - She is tolerating tamoxifen very well. -Physical examination today shows left lumpectomy scar in the upper outer quadrant is unchanged.  Left breast lymphedema of the skin present.  No palpable masses in bilateral breast.  No palpable adenopathy. -I reviewed her blood work which was grossly within normal limits. -Mammogram dated 03/21/2018 was BI-RADS Category 2. -Because of her family history, we ordered genetic testing which turned out to be negative. - She will come back in 6 months for follow-up. -She does report cramps at nighttime, left leg more than right leg.  She is taking magnesium and also takes vinegar with water which is helping.  She had these cramps for many years.  They have not gotten any worse since tamoxifen.  2.  Osteoporosis: Her last DEXA scan on 02/24/2017 shows osteoporosis with a T score of -3.2.  She is receiving Prolia through her PCP.

## 2018-03-23 NOTE — Progress Notes (Signed)
[facility] [place], [place] [address]   CLINIC:  Medical Oncology/Hematology  PCP:  [name], MD [place] [address] [phone]   REASON FOR VISIT: Follow-up for stage 0 left breast DICS, ER+/PR+  CURRENT THERAPY: tamoxifen  BRIEF ONCOLOGIC HISTORY:    DCIS (ductal carcinoma in situ)   04/12/2017 Initial Diagnosis    DCIS (ductal carcinoma in situ)    09/03/2017 Genetic Testing    Negative genetic testing on the multi-cancer panel.  The Multi-Gene Panel offered by [facility] includes sequencing and/or deletion duplication testing of the following 84 genes: AIP, ALK, APC, ATM, AXIN2,BAP1,  BARD1, BLM, BMPR1A, BRCA1, BRCA2, BRIP1, CASR, CDC73, CDH1, CDK4, CDKN1B, CDKN1C, CDKN2A (p14ARF), CDKN2A (p16INK4a), CEBPA, CHEK2, CTNNA1, DICER1, DIS3L2, EGFR (c.2369C>T, p.Thr790Met variant only), EPCAM (Deletion/duplication testing only), FH, FLCN, GATA2, GPC3, GREM1 (Promoter region deletion/duplication testing only), HOXB13 (c.251G>A, p.Gly84Glu), HRAS, KIT, MAX, MEN1, MET, MITF (c.952G>A, p.Glu318Lys variant only), MLH1, MSH2, MSH3, MSH6, MUTYH, NBN, NF1, NF2, NTHL1, PALB2, PDGFRA, PHOX2B, PMS2, POLD1, POLE, POT1, PRKAR1A, PTCH1, PTEN, RAD50, RAD51C, RAD51D, RB1, RECQL4, RET, RUNX1, SDHAF2, SDHA (sequence changes only), SDHB, SDHC, SDHD, SMAD4, SMARCA4, SMARCB1, SMARCE1, STK11, SUFU, TERC, TERT, TMEM127, TP53, TSC1, TSC2, VHL, WRN and WT1.  The report date is September 03, 2017.      INTERVAL HISTORY:  Ms. Livingood 68 y.o. female returns for routine follow-up for left breast cancer. She is here today with her family. She reports she is having leg cramps at night. Her primary care doctor placed her on magnesium and it has not helped her. Denies any nausea, vomiting, or diarrhea. Denies any new pains. Had not noticed any recent bleeding such as epistaxis, hematuria or hematochezia. Denies recent chest pain on exertion, shortness of breath on minimal  exertion, pre-syncopal episodes, or palpitations. Denies any numbness or tingling in hands or feet. Denies any recent fevers, infections, or recent hospitalizations. Patient reports appetite at 100% and energy level at 100%. She is eating well and maintaining her weight.   REVIEW OF SYSTEMS:  Review of Systems  Constitutional:       Leg cramps  All other systems reviewed and are negative.    PAST MEDICAL/SURGICAL HISTORY:  Past Medical History:  Diagnosis Date  . Anxiety   . Arthritis   . Depression   . Family history of colon cancer   . Family history of melanoma   . Family history of pancreatic cancer   . HLD (hyperlipidemia)   . HTN (hypertension)   . Seizures ([place])    had a seizure as child, unknown etiology, no meds though. No seizures since 10 years.   Past Surgical History:  Procedure Laterality Date  . PARTIAL MASTECTOMY WITH NEEDLE LOCALIZATION Left 05/11/2017   Procedure: PARTIAL MASTECTOMY WITH NEEDLE LOCALIZATION;  Surgeon: Virl Cagey, MD;  Location: [facility];  Service: General;  Laterality: Left;  . TUBAL LIGATION       SOCIAL HISTORY:  Social History   Socioeconomic History  . Marital status: Married    Spouse name: Not on file  . Number of children: Not on file  . Years of education: Not on file  . Highest education level: Not on file  Occupational History  . Not on file  Social Needs  . Financial resource strain: Not on file  . Food insecurity:    Worry: Not on file    Inability: Not on file  . Transportation needs:  Medical: Not on file    Non-medical: Not on file  Tobacco Use  . Smoking status: Current Every Day Smoker    Packs/day: 1.00    Years: 40.00    Pack years: 40.00    Types: Cigarettes  . Smokeless tobacco: Never Used  Substance and Sexual Activity  . Alcohol use: Never    Frequency: Never  . Drug use: Never  . Sexual activity: Yes    Birth control/protection: None  Lifestyle  . Physical activity:    Days per week:  Not on file    Minutes per session: Not on file  . Stress: Not on file  Relationships  . Social connections:    Talks on phone: Not on file    Gets together: Not on file    Attends religious service: Not on file    Active member of club or organization: Not on file    Attends meetings of clubs or organizations: Not on file    Relationship status: Not on file  . Intimate partner violence:    Fear of current or ex partner: Not on file    Emotionally abused: Not on file    Physically abused: Not on file    Forced sexual activity: Not on file  Other Topics Concern  . Not on file  Social History Narrative  . Not on file    FAMILY HISTORY:  Family History  Problem Relation Age of Onset  . Pancreatic cancer Mother 67       d. 70  . Atrial fibrillation Father   . COPD Father   . Hypertension Father   . High Cholesterol Father   . Pancreatic cancer Maternal Aunt 75       d. 40  . Pancreatic cancer Maternal Uncle        dx and died in his 62s  . Lung cancer Maternal Grandfather 92       d. 80  . Heart attack Maternal Grandfather 92       d. 42  . Lung cancer Maternal Aunt   . Leukemia Maternal Uncle        dx in his 54s-40s  . Heart attack Maternal Uncle   . Melanoma Other        MGMs sister  . Colon cancer Other        MGMs mother    CURRENT MEDICATIONS:  Outpatient Encounter Medications as of 03/23/2018  Medication Sig  . acetaminophen (TYLENOL) 650 MG CR tablet Take 1,300 mg by mouth daily as needed for pain.  Marland Kitchen ALPRAZolam (XANAX) 0.25 MG tablet Take 0.25 mg by mouth 3 (three) times daily as needed for anxiety.   Marland Kitchen atorvastatin (LIPITOR) 20 MG tablet Take 20 mg by mouth every evening.   . cholecalciferol (VITAMIN D) 1000 units tablet Take 5,000 Units by mouth daily.  . Cholecalciferol (VITAMIN D-1000 MAX ST) 25 MCG (1000 UT) tablet Take by mouth.  . denosumab (PROLIA) 60 MG/ML SOLN injection Inject 60 mg into the skin every 6 (six) months. Administer in upper arm,  thigh, or abdomen  . escitalopram (LEXAPRO) 10 MG tablet Take 10 mg by mouth daily.   Marland Kitchen lisinopril (PRINIVIL,ZESTRIL) 10 MG tablet Take 10 mg by mouth daily as needed (high bp).   . Magnesium 400 MG TABS Take 200 mg by mouth daily.   . mometasone (ELOCON) 0.1 % cream Apply to affected area twice daily  . nystatin (MYCOSTATIN) 100000 UNIT/ML suspension Take by mouth.  Marland Kitchen  tamoxifen (NOLVADEX) 20 MG tablet Take 1 tablet (20 mg total) by mouth daily.   No facility-administered encounter medications on file as of 03/23/2018.     ALLERGIES:  No Known Allergies   PHYSICAL EXAM:  ECOG Performance status: 1  Vitals:   03/23/18 1412  BP: 132/65  Pulse: 85  Temp: 98 F (36.7 C)  SpO2: 97%   Filed Weights   03/23/18 1412  Weight: 193 lb 9.6 oz (87.8 kg)    Physical Exam Constitutional:      Appearance: Normal appearance. She is normal weight.  Cardiovascular:     Rate and Rhythm: Normal rate and regular rhythm.     Heart sounds: Normal heart sounds.  Pulmonary:     Effort: Pulmonary effort is normal.     Breath sounds: Normal breath sounds.  Abdominal:     General: Abdomen is flat.     Palpations: Abdomen is soft.  Musculoskeletal: Normal range of motion.  Skin:    General: Skin is warm and dry.  Neurological:     Mental Status: She is alert and oriented to person, place, and time. Mental status is at baseline.  Psychiatric:        Mood and Affect: Mood normal.        Behavior: Behavior normal.        Thought Content: Thought content normal.        Judgment: Judgment normal.   Breast: No palpable masses, no skin changes or nipple discharge, no adenopathy.    LABORATORY DATA:  I have reviewed the labs as listed.  CBC    Component Value Date/Time   WBC 10.1 03/21/2018 1317   RBC 4.81 03/21/2018 1317   HGB 13.5 03/21/2018 1317   HCT 44.2 03/21/2018 1317   PLT 297 03/21/2018 1317   MCV 91.9 03/21/2018 1317   MCH 28.1 03/21/2018 1317   MCHC 30.5 03/21/2018 1317    RDW 13.6 03/21/2018 1317   LYMPHSABS 3.6 03/21/2018 1317   MONOABS 0.8 03/21/2018 1317   EOSABS 0.1 03/21/2018 1317   BASOSABS 0.1 03/21/2018 1317   CMP Latest Ref Rng & Units 03/21/2018 08/22/2017 05/05/2017  Glucose 70 - 99 mg/dL 107(H) 105(H) 111(H)  BUN 8 - 23 mg/dL 8 9 9   Creatinine 0.44 - 1.00 mg/dL 0.75 0.77 0.67  Sodium 135 - 145 mmol/L 138 137 138  Potassium 3.5 - 5.1 mmol/L 4.2 4.0 4.3  Chloride 98 - 111 mmol/L 105 102 103  CO2 22 - 32 mmol/L 24 27 24   Calcium 8.9 - 10.3 mg/dL 8.7(L) 8.9 9.1  Total Protein 6.5 - 8.1 g/dL 7.3 7.5 -  Total Bilirubin 0.3 - 1.2 mg/dL 0.6 0.6 -  Alkaline Phos 38 - 126 U/L 55 74 -  AST 15 - 41 U/L 17 16 -  ALT 0 - 44 U/L 16 16 -       DIAGNOSTIC IMAGING:  I have independently reviewed the scans and discussed with the patient.   I have reviewed Francene Finders, NP's note and agree with the documentation.  I personally performed a face-to-face visit, made revisions and my assessment and plan is as follows.    ASSESSMENT & PLAN:   DCIS (ductal carcinoma in situ) 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 -Biopsy of the left breast on 03/31/2017, DCIS, ER/PR positive -Left lumpectomy on 05/11/2017, intermediate grade DCIS, margins negative - I have recommended risk reduction therapy with 5 years of  tamoxifen/aromatase inhibitor.  Because of her osteoporosis, I chose tamoxifen. -She was started on tamoxifen after radiation therapy was completed. - She is tolerating tamoxifen very well. -Physical examination today shows left lumpectomy scar in the upper outer quadrant is unchanged.  Left breast lymphedema of the skin present.  No palpable masses in bilateral breast.  No palpable adenopathy. -I reviewed her blood work which was grossly within normal limits. -Mammogram dated 03/21/2018 was BI-RADS Category 2. -Because of her family history, we ordered genetic testing which turned out to be negative. - She  will come back in 6 months for follow-up. -She does report cramps at nighttime, left leg more than right leg.  She is taking magnesium and also takes vinegar with water which is helping.  She had these cramps for many years.  They have not gotten any worse since tamoxifen.  2.  Osteoporosis: Her last DEXA scan on 02/24/2017 shows osteoporosis with a T score of -3.2.  She is receiving Prolia through her PCP.      Orders placed this encounter:  Orders Placed This Encounter  Procedures  . Magnesium  . CBC with Differential/Platelet  . Comprehensive metabolic panel  . Vitamin D 25 hydroxy  . Magnesium      Derek Jack, MD [place] [phone]

## 2018-03-23 NOTE — Patient Instructions (Signed)
[place] at [facility] Discharge Instructions  You were seen today by Dr. Delton Coombes, he went over how you've been feeling and the cramping in your legs. He discussed your recent lab results and everything looked good. He did a manual exam and found nothing concerning. We will see you back in 6 months for labs and follow up.    Thank you for choosing [place] at [facility] to provide your oncology and hematology care.  To afford each patient quality time with our provider, please arrive at least 15 minutes before your scheduled appointment time.   If you have a lab appointment with the [facility] please come in thru the  Main Entrance and check in at the main information desk  You need to re-schedule your appointment should you arrive 10 or more minutes late.  We strive to give you quality time with our providers, and arriving late affects you and other patients whose appointments are after yours.  Also, if you no show three or more times for appointments you may be dismissed from the clinic at the providers discretion.     Again, thank you for choosing [facility].  Our hope is that these requests will decrease the amount of time that you wait before being seen by our physicians.       _____________________________________________________________  Should you have questions after your visit to [facility], please contact our office at [phone] between the hours of 8:00 a.m. and 4:30 p.m.  Voicemails left after 4:00 p.m. will not be returned until the following business day.  For prescription refill requests, have your pharmacy contact our office and allow 72 hours.    Cancer Center Support Programs:   > Cancer Support Group  2nd Tuesday of the month 1pm-2pm, Journey Room

## 2018-04-05 DIAGNOSIS — E782 Mixed hyperlipidemia: Secondary | ICD-10-CM | POA: Diagnosis not present

## 2018-04-05 DIAGNOSIS — I10 Essential (primary) hypertension: Secondary | ICD-10-CM | POA: Diagnosis not present

## 2018-04-05 DIAGNOSIS — F419 Anxiety disorder, unspecified: Secondary | ICD-10-CM | POA: Diagnosis not present

## 2018-05-01 DIAGNOSIS — E782 Mixed hyperlipidemia: Secondary | ICD-10-CM | POA: Diagnosis not present

## 2018-05-01 DIAGNOSIS — I10 Essential (primary) hypertension: Secondary | ICD-10-CM | POA: Diagnosis not present

## 2018-05-01 DIAGNOSIS — F419 Anxiety disorder, unspecified: Secondary | ICD-10-CM | POA: Diagnosis not present

## 2018-05-31 DIAGNOSIS — E782 Mixed hyperlipidemia: Secondary | ICD-10-CM | POA: Diagnosis not present

## 2018-05-31 DIAGNOSIS — I10 Essential (primary) hypertension: Secondary | ICD-10-CM | POA: Diagnosis not present

## 2018-05-31 DIAGNOSIS — Z0000 Encounter for general adult medical examination without abnormal findings: Secondary | ICD-10-CM | POA: Diagnosis not present

## 2018-05-31 DIAGNOSIS — R7301 Impaired fasting glucose: Secondary | ICD-10-CM | POA: Diagnosis not present

## 2018-06-06 DIAGNOSIS — F411 Generalized anxiety disorder: Secondary | ICD-10-CM | POA: Diagnosis not present

## 2018-06-06 DIAGNOSIS — M810 Age-related osteoporosis without current pathological fracture: Secondary | ICD-10-CM | POA: Diagnosis not present

## 2018-06-06 DIAGNOSIS — R7301 Impaired fasting glucose: Secondary | ICD-10-CM | POA: Diagnosis not present

## 2018-06-06 DIAGNOSIS — D72829 Elevated white blood cell count, unspecified: Secondary | ICD-10-CM | POA: Diagnosis not present

## 2018-06-06 DIAGNOSIS — Z853 Personal history of malignant neoplasm of breast: Secondary | ICD-10-CM | POA: Diagnosis not present

## 2018-06-06 DIAGNOSIS — W57XXXA Bitten or stung by nonvenomous insect and other nonvenomous arthropods, initial encounter: Secondary | ICD-10-CM | POA: Diagnosis not present

## 2018-06-06 DIAGNOSIS — E875 Hyperkalemia: Secondary | ICD-10-CM | POA: Diagnosis not present

## 2018-06-06 DIAGNOSIS — E785 Hyperlipidemia, unspecified: Secondary | ICD-10-CM | POA: Diagnosis not present

## 2018-06-06 DIAGNOSIS — R7303 Prediabetes: Secondary | ICD-10-CM | POA: Diagnosis not present

## 2018-06-06 DIAGNOSIS — F17210 Nicotine dependence, cigarettes, uncomplicated: Secondary | ICD-10-CM | POA: Diagnosis not present

## 2018-06-06 DIAGNOSIS — I10 Essential (primary) hypertension: Secondary | ICD-10-CM | POA: Diagnosis not present

## 2018-06-07 DIAGNOSIS — F419 Anxiety disorder, unspecified: Secondary | ICD-10-CM | POA: Diagnosis not present

## 2018-06-07 DIAGNOSIS — E782 Mixed hyperlipidemia: Secondary | ICD-10-CM | POA: Diagnosis not present

## 2018-06-07 DIAGNOSIS — I10 Essential (primary) hypertension: Secondary | ICD-10-CM | POA: Diagnosis not present

## 2018-06-07 DIAGNOSIS — L905 Scar conditions and fibrosis of skin: Secondary | ICD-10-CM | POA: Diagnosis not present

## 2018-06-07 DIAGNOSIS — Z9012 Acquired absence of left breast and nipple: Secondary | ICD-10-CM | POA: Diagnosis not present

## 2018-06-07 DIAGNOSIS — D0512 Intraductal carcinoma in situ of left breast: Secondary | ICD-10-CM | POA: Diagnosis not present

## 2018-06-20 DIAGNOSIS — F419 Anxiety disorder, unspecified: Secondary | ICD-10-CM | POA: Diagnosis not present

## 2018-06-20 DIAGNOSIS — I10 Essential (primary) hypertension: Secondary | ICD-10-CM | POA: Diagnosis not present

## 2018-06-20 DIAGNOSIS — E782 Mixed hyperlipidemia: Secondary | ICD-10-CM | POA: Diagnosis not present

## 2018-07-31 DIAGNOSIS — F419 Anxiety disorder, unspecified: Secondary | ICD-10-CM | POA: Diagnosis not present

## 2018-07-31 DIAGNOSIS — I10 Essential (primary) hypertension: Secondary | ICD-10-CM | POA: Diagnosis not present

## 2018-07-31 DIAGNOSIS — E782 Mixed hyperlipidemia: Secondary | ICD-10-CM | POA: Diagnosis not present

## 2018-08-31 DIAGNOSIS — F419 Anxiety disorder, unspecified: Secondary | ICD-10-CM | POA: Diagnosis not present

## 2018-08-31 DIAGNOSIS — E782 Mixed hyperlipidemia: Secondary | ICD-10-CM | POA: Diagnosis not present

## 2018-08-31 DIAGNOSIS — I10 Essential (primary) hypertension: Secondary | ICD-10-CM | POA: Diagnosis not present

## 2018-09-15 ENCOUNTER — Other Ambulatory Visit: Payer: Self-pay

## 2018-09-15 ENCOUNTER — Encounter (HOSPITAL_COMMUNITY)
Admission: RE | Admit: 2018-09-15 | Discharge: 2018-09-15 | Disposition: A | Discharge status: Home / Self Care | Payer: PPO | Source: Ambulatory Visit | Attending: Internal Medicine | Admitting: Internal Medicine

## 2018-09-15 DIAGNOSIS — M810 Age-related osteoporosis without current pathological fracture: Secondary | ICD-10-CM | POA: Diagnosis not present

## 2018-09-15 MED ORDER — DENOSUMAB 60 MG/ML ~~LOC~~ SOSY
60.0000 mg | PREFILLED_SYRINGE | Freq: Once | SUBCUTANEOUS | Status: AC
  Administered 2018-09-15: 60 mg via SUBCUTANEOUS

## 2018-09-15 MED ORDER — DENOSUMAB 60 MG/ML ~~LOC~~ SOSY
PREFILLED_SYRINGE | SUBCUTANEOUS | Status: AC
  Filled 2018-09-15: qty 1

## 2018-09-19 ENCOUNTER — Other Ambulatory Visit: Payer: Self-pay

## 2018-09-19 ENCOUNTER — Inpatient Hospital Stay (HOSPITAL_COMMUNITY): Payer: PPO | Attending: Hematology

## 2018-09-19 DIAGNOSIS — Z79810 Long term (current) use of selective estrogen receptor modulators (SERMs): Secondary | ICD-10-CM | POA: Insufficient documentation

## 2018-09-19 DIAGNOSIS — F17210 Nicotine dependence, cigarettes, uncomplicated: Secondary | ICD-10-CM | POA: Diagnosis not present

## 2018-09-19 DIAGNOSIS — Z800 Family history of malignant neoplasm of digestive organs: Secondary | ICD-10-CM | POA: Diagnosis not present

## 2018-09-19 DIAGNOSIS — Z7951 Long term (current) use of inhaled steroids: Secondary | ICD-10-CM | POA: Diagnosis not present

## 2018-09-19 DIAGNOSIS — I10 Essential (primary) hypertension: Secondary | ICD-10-CM | POA: Insufficient documentation

## 2018-09-19 DIAGNOSIS — D0512 Intraductal carcinoma in situ of left breast: Secondary | ICD-10-CM

## 2018-09-19 DIAGNOSIS — Z79899 Other long term (current) drug therapy: Secondary | ICD-10-CM | POA: Diagnosis not present

## 2018-09-19 DIAGNOSIS — F329 Major depressive disorder, single episode, unspecified: Secondary | ICD-10-CM | POA: Insufficient documentation

## 2018-09-19 DIAGNOSIS — Z170 Estrogen receptor positive status [ER+]: Secondary | ICD-10-CM | POA: Insufficient documentation

## 2018-09-19 DIAGNOSIS — E785 Hyperlipidemia, unspecified: Secondary | ICD-10-CM | POA: Insufficient documentation

## 2018-09-19 DIAGNOSIS — F419 Anxiety disorder, unspecified: Secondary | ICD-10-CM | POA: Insufficient documentation

## 2018-09-19 DIAGNOSIS — C50912 Malignant neoplasm of unspecified site of left female breast: Secondary | ICD-10-CM | POA: Diagnosis not present

## 2018-09-19 LAB — CBC WITH DIFFERENTIAL/PLATELET
Abs Immature Granulocytes: 0.05 10*3/uL (ref 0.00–0.07)
Basophils Absolute: 0.1 10*3/uL (ref 0.0–0.1)
Basophils Relative: 1 %
Eosinophils Absolute: 0.1 10*3/uL (ref 0.0–0.5)
Eosinophils Relative: 1 %
HCT: 42.5 % (ref 36.0–46.0)
Hemoglobin: 13.2 g/dL (ref 12.0–15.0)
Immature Granulocytes: 1 %
Lymphocytes Relative: 33 %
Lymphs Abs: 3.1 10*3/uL (ref 0.7–4.0)
MCH: 28.9 pg (ref 26.0–34.0)
MCHC: 31.1 g/dL (ref 30.0–36.0)
MCV: 93.2 fL (ref 80.0–100.0)
Monocytes Absolute: 0.6 10*3/uL (ref 0.1–1.0)
Monocytes Relative: 6 %
Neutro Abs: 5.6 10*3/uL (ref 1.7–7.7)
Neutrophils Relative %: 58 %
Platelets: 251 10*3/uL (ref 150–400)
RBC: 4.56 MIL/uL (ref 3.87–5.11)
RDW: 13.5 % (ref 11.5–15.5)
WBC: 9.5 10*3/uL (ref 4.0–10.5)
nRBC: 0 % (ref 0.0–0.2)

## 2018-09-19 LAB — COMPREHENSIVE METABOLIC PANEL
ALT: 14 U/L (ref 0–44)
AST: 14 U/L — ABNORMAL LOW (ref 15–41)
Albumin: 3.9 g/dL (ref 3.5–5.0)
Alkaline Phosphatase: 67 U/L (ref 38–126)
Anion gap: 9 (ref 5–15)
BUN: 6 mg/dL — ABNORMAL LOW (ref 8–23)
CO2: 26 mmol/L (ref 22–32)
Calcium: 8.7 mg/dL — ABNORMAL LOW (ref 8.9–10.3)
Chloride: 106 mmol/L (ref 98–111)
Creatinine, Ser: 0.77 mg/dL (ref 0.44–1.00)
GFR calc Af Amer: >60 mL/min (ref >60)
GFR calc non Af Amer: >60 mL/min (ref >60)
Glucose, Bld: 100 mg/dL — ABNORMAL HIGH (ref 70–99)
Potassium: 4.1 mmol/L (ref 3.5–5.1)
Sodium: 141 mmol/L (ref 135–145)
Total Bilirubin: 0.2 mg/dL — ABNORMAL LOW (ref 0.3–1.2)
Total Protein: 7.2 g/dL (ref 6.5–8.1)

## 2018-09-19 LAB — MAGNESIUM: Magnesium: 2 mg/dL (ref 1.7–2.4)

## 2018-09-20 LAB — VITAMIN D 25 HYDROXY (VIT D DEFICIENCY, FRACTURES): Vit D, 25-Hydroxy: 55.2 ng/mL (ref 30.0–100.0)

## 2018-09-26 ENCOUNTER — Encounter (HOSPITAL_COMMUNITY): Payer: Self-pay | Admitting: Nurse Practitioner

## 2018-09-26 ENCOUNTER — Inpatient Hospital Stay (HOSPITAL_BASED_OUTPATIENT_CLINIC_OR_DEPARTMENT_OTHER): Payer: PPO | Admitting: Nurse Practitioner

## 2018-09-26 ENCOUNTER — Other Ambulatory Visit: Payer: Self-pay

## 2018-09-26 VITALS — BP 121/66 | HR 79 | Temp 97.1°F | Resp 18 | Wt 185.0 lb

## 2018-09-26 DIAGNOSIS — C50912 Malignant neoplasm of unspecified site of left female breast: Secondary | ICD-10-CM | POA: Diagnosis not present

## 2018-09-26 DIAGNOSIS — D0512 Intraductal carcinoma in situ of left breast: Secondary | ICD-10-CM

## 2018-09-26 DIAGNOSIS — M810 Age-related osteoporosis without current pathological fracture: Secondary | ICD-10-CM | POA: Diagnosis not present

## 2018-09-26 DIAGNOSIS — Z780 Asymptomatic menopausal state: Secondary | ICD-10-CM

## 2018-09-26 NOTE — Progress Notes (Signed)
[address], [place] [address]   CLINIC:  Medical Oncology/Hematology  PCP:  [name], MD [place] [address] [phone]   REASON FOR VISIT: Follow-up for left breast cancer  CURRENT THERAPY: Tamoxifen  BRIEF ONCOLOGIC HISTORY:  Oncology History  DCIS (ductal carcinoma in situ)  04/12/2017 Initial Diagnosis   DCIS (ductal carcinoma in situ)   09/03/2017 Genetic Testing   Negative genetic testing on the multi-cancer panel.  The Multi-Gene Panel offered by [facility] includes sequencing and/or deletion duplication testing of the following 84 genes: AIP, ALK, APC, ATM, AXIN2,BAP1,  BARD1, BLM, BMPR1A, BRCA1, BRCA2, BRIP1, CASR, CDC73, CDH1, CDK4, CDKN1B, CDKN1C, CDKN2A (p14ARF), CDKN2A (p16INK4a), CEBPA, CHEK2, CTNNA1, DICER1, DIS3L2, EGFR (c.2369C>T, p.Thr790Met variant only), EPCAM (Deletion/duplication testing only), FH, FLCN, GATA2, GPC3, GREM1 (Promoter region deletion/duplication testing only), HOXB13 (c.251G>A, p.Gly84Glu), HRAS, KIT, MAX, MEN1, MET, MITF (c.952G>A, p.Glu318Lys variant only), MLH1, MSH2, MSH3, MSH6, MUTYH, NBN, NF1, NF2, NTHL1, PALB2, PDGFRA, PHOX2B, PMS2, POLD1, POLE, POT1, PRKAR1A, PTCH1, PTEN, RAD50, RAD51C, RAD51D, RB1, RECQL4, RET, RUNX1, SDHAF2, SDHA (sequence changes only), SDHB, SDHC, SDHD, SMAD4, SMARCA4, SMARCB1, SMARCE1, STK11, SUFU, TERC, TERT, TMEM127, TP53, TSC1, TSC2, VHL, WRN and WT1.  The report date is September 03, 2017.     INTERVAL HISTORY:  Ms. Sweeney 68 y.o. female returns for routine follow-up for left breast cancer.  She reports she has been doing well since her last visit.  She is taking her tamoxifen as prescribed.  She has no complaints or side effects.  She denies any hot flashes.  She denies any ankle swelling. Denies any nausea, vomiting, or diarrhea. Denies any new pains. Had not noticed any recent bleeding such as epistaxis, hematuria or hematochezia. Denies recent chest pain on  exertion, shortness of breath on minimal exertion, pre-syncopal episodes, or palpitations. Denies any numbness or tingling in hands or feet. Denies any recent fevers, infections, or recent hospitalizations. Patient reports appetite at 100 % and energy level at 75 %.  She is eating well maintaining her weight at this time.    REVIEW OF SYSTEMS:  Review of Systems  All other systems reviewed and are negative.    PAST MEDICAL/SURGICAL HISTORY:  Past Medical History:  Diagnosis Date  . Anxiety   . Arthritis   . Depression   . Family history of colon cancer   . Family history of melanoma   . Family history of pancreatic cancer   . HLD (hyperlipidemia)   . HTN (hypertension)   . Seizures ([place])    had a seizure as child, unknown etiology, no meds though. No seizures since 10 years.   Past Surgical History:  Procedure Laterality Date  . PARTIAL MASTECTOMY WITH NEEDLE LOCALIZATION Left 05/11/2017   Procedure: PARTIAL MASTECTOMY WITH NEEDLE LOCALIZATION;  Surgeon: Virl Cagey, MD;  Location: [facility];  Service: General;  Laterality: Left;  . TUBAL LIGATION       SOCIAL HISTORY:  Social History   Socioeconomic History  . Marital status: Married    Spouse name: Not on file  . Number of children: Not on file  . Years of education: Not on file  . Highest education level: Not on file  Occupational History  . Not on file  Social Needs  . Financial resource strain: Not on file  . Food insecurity    Worry: Not on file    Inability: Not on file  . Transportation needs  Medical: Not on file    Non-medical: Not on file  Tobacco Use  . Smoking status: Current Every Day Smoker    Packs/day: 1.00    Years: 40.00    Pack years: 40.00    Types: Cigarettes  . Smokeless tobacco: Never Used  Substance and Sexual Activity  . Alcohol use: Never    Frequency: Never  . Drug use: Never  . Sexual activity: Yes    Birth control/protection: None  Lifestyle  . Physical activity     Days per week: Not on file    Minutes per session: Not on file  . Stress: Not on file  Relationships  . Social Herbalist on phone: Not on file    Gets together: Not on file    Attends religious service: Not on file    Active member of club or organization: Not on file    Attends meetings of clubs or organizations: Not on file    Relationship status: Not on file  . Intimate partner violence    Fear of current or ex partner: Not on file    Emotionally abused: Not on file    Physically abused: Not on file    Forced sexual activity: Not on file  Other Topics Concern  . Not on file  Social History Narrative  . Not on file    FAMILY HISTORY:  Family History  Problem Relation Age of Onset  . Pancreatic cancer Mother 64       d. 18  . Atrial fibrillation Father   . COPD Father   . Hypertension Father   . High Cholesterol Father   . Pancreatic cancer Maternal Aunt 75       d. 64  . Pancreatic cancer Maternal Uncle        dx and died in his 72s  . Lung cancer Maternal Grandfather 92       d. 36  . Heart attack Maternal Grandfather 92       d. 39  . Lung cancer Maternal Aunt   . Leukemia Maternal Uncle        dx in his 51s-40s  . Heart attack Maternal Uncle   . Melanoma Other        MGMs sister  . Colon cancer Other        MGMs mother    CURRENT MEDICATIONS:  Outpatient Encounter Medications as of 09/26/2018  Medication Sig  . cholecalciferol (VITAMIN D) 1000 units tablet Take 5,000 Units by mouth daily.  Marland Kitchen denosumab (PROLIA) 60 MG/ML SOLN injection Inject 60 mg into the skin every 6 (six) months. Administer in upper arm, thigh, or abdomen  . escitalopram (LEXAPRO) 10 MG tablet Take 10 mg by mouth daily.   . Magnesium 400 MG TABS Take 200 mg by mouth daily.   . tamoxifen (NOLVADEX) 20 MG tablet Take 1 tablet (20 mg total) by mouth daily.  . [DISCONTINUED] lisinopril (PRINIVIL,ZESTRIL) 10 MG tablet Take 10 mg by mouth daily as needed (high bp).   Marland Kitchen acetaminophen  (TYLENOL) 650 MG CR tablet Take 1,300 mg by mouth daily as needed for pain.  Marland Kitchen ALPRAZolam (XANAX) 0.25 MG tablet Take 0.25 mg by mouth 3 (three) times daily as needed for anxiety.   Marland Kitchen atorvastatin (LIPITOR) 10 MG tablet   . [DISCONTINUED] atorvastatin (LIPITOR) 20 MG tablet Take 20 mg by mouth every evening.   . [DISCONTINUED] Cholecalciferol (VITAMIN D-1000 MAX ST) 25 MCG (1000 UT) tablet Take  by mouth.  . [DISCONTINUED] mometasone (ELOCON) 0.1 % cream Apply to affected area twice daily  . [DISCONTINUED] nystatin (MYCOSTATIN) 100000 UNIT/ML suspension Take by mouth.   No facility-administered encounter medications on file as of 09/26/2018.     ALLERGIES:  No Known Allergies   PHYSICAL EXAM:  ECOG Performance status: 1  Vitals:   09/26/18 1323  BP: 121/66  Pulse: 79  Resp: 18  Temp: (!) 97.1 F (36.2 C)  SpO2: 97%   Filed Weights   09/26/18 1323  Weight: 185 lb (83.9 kg)    Physical Exam Constitutional:      Appearance: Normal appearance. She is normal weight.  Cardiovascular:     Rate and Rhythm: Normal rate and regular rhythm.     Heart sounds: Normal heart sounds.  Pulmonary:     Effort: Pulmonary effort is normal.     Breath sounds: Normal breath sounds.  Abdominal:     General: Bowel sounds are normal.     Palpations: Abdomen is soft.  Musculoskeletal: Normal range of motion.  Skin:    General: Skin is warm and dry.  Neurological:     Mental Status: She is alert and oriented to person, place, and time. Mental status is at baseline.  Psychiatric:        Mood and Affect: Mood normal.        Behavior: Behavior normal.        Thought Content: Thought content normal.        Judgment: Judgment normal.   Breast: Left: Patient has mild lymphedema on her left breast.  Lumpectomy scar well-healed.  No palpable masses or adenopathy. Right: No palpable masses, no skin changes or nipple discharge, no adenopathy.   LABORATORY DATA:  I have reviewed the labs as  listed.  CBC    Component Value Date/Time   WBC 9.5 09/19/2018 1257   RBC 4.56 09/19/2018 1257   HGB 13.2 09/19/2018 1257   HCT 42.5 09/19/2018 1257   PLT 251 09/19/2018 1257   MCV 93.2 09/19/2018 1257   MCH 28.9 09/19/2018 1257   MCHC 31.1 09/19/2018 1257   RDW 13.5 09/19/2018 1257   LYMPHSABS 3.1 09/19/2018 1257   MONOABS 0.6 09/19/2018 1257   EOSABS 0.1 09/19/2018 1257   BASOSABS 0.1 09/19/2018 1257   CMP Latest Ref Rng & Units 09/19/2018 03/21/2018 08/22/2017  Glucose 70 - 99 mg/dL 100(H) 107(H) 105(H)  BUN 8 - 23 mg/dL 6(L) 8 9  Creatinine 0.44 - 1.00 mg/dL 0.77 0.75 0.77  Sodium 135 - 145 mmol/L 141 138 137  Potassium 3.5 - 5.1 mmol/L 4.1 4.2 4.0  Chloride 98 - 111 mmol/L 106 105 102  CO2 22 - 32 mmol/L 26 24 27   Calcium 8.9 - 10.3 mg/dL 8.7(L) 8.7(L) 8.9  Total Protein 6.5 - 8.1 g/dL 7.2 7.3 7.5  Total Bilirubin 0.3 - 1.2 mg/dL 0.2(L) 0.6 0.6  Alkaline Phos 38 - 126 U/L 67 55 74  AST 15 - 41 U/L 14(L) 17 16  ALT 0 - 44 U/L 14 16 16      I personally performed a face-to-face visit.  All questions were answered to patient's stated satisfaction. Encouraged patient to call with any new concerns or questions before his next visit to the cancer center and we can certain see him sooner, if needed.     ASSESSMENT & PLAN:   DCIS (ductal carcinoma in situ) 1.  Left breast DCIS: - Abnormal mammogram on 02/24/2017, after 10 years of no  mammograms. -Biopsy of the left breast on 03/31/2017, DCIS, ER/PR positive -Left lumpectomy on 05/11/2017, intermediate grade DCIS, margins negative. - Risk reduction therapy was recommended with 5 years of tamoxifen/aromatase inhibitor.  Because of her osteoporosis tamoxifen was chosen. - Tamoxifen was started 07/2017 after radiation therapy was completed.  She is tolerating tamoxifen well. - Physical examination today showed left lumpectomy scar in the right upper outer quadrant is unchanged.  Left breast lymphedema of the skin is present.  No palpable  masses in bilateral breasts.  No palpable adenopathy. - Labs done on 09/19/2018 showed her potassium 4.1, creatinine 0.77, magnesium 2.0, WBC 9.5, hemoglobin 13.2, platelets 251. -Mammogram dated 03/21/2018 was BI-RADS Category 2. - She will follow-up in 6 months with repeat labs and mammogram.  2.  Osteoporosis: -Her last DEXA scan was on 02/24/2017 which showed osteoporosis with a T score of -3.2. -She is receiving Prolia injections through her PCP.      Orders placed this encounter:  Orders Placed This Encounter  Procedures  . MM DIAG BREAST TOMO BILATERAL  . DG Bone Density  . Lactate dehydrogenase  . Magnesium  . CBC with Differential/Platelet  . Comprehensive metabolic panel  . Vitamin B12  . VITAMIN D 25 Hydroxy (Vit-D Deficiency, Fractures)      Francene Finders, FNP-C [facility] [phone]

## 2018-09-26 NOTE — Assessment & Plan Note (Addendum)
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 -Biopsy of the left breast on 03/31/2017, DCIS, ER/PR positive -Left lumpectomy on 05/11/2017, intermediate grade DCIS, margins negative. - Risk reduction therapy was recommended with 5 years of tamoxifen/aromatase inhibitor.  Because of her osteoporosis tamoxifen was chosen. - Tamoxifen was started 07/2017 after radiation therapy was completed.  She is tolerating tamoxifen well. - Physical examination today showed left lumpectomy scar in the right upper outer quadrant is unchanged.  Left breast lymphedema of the skin is present.  No palpable masses in bilateral breasts.  No palpable adenopathy. - Labs done on 09/19/2018 showed her potassium 4.1, creatinine 0.77, magnesium 2.0, WBC 9.5, hemoglobin 13.2, platelets 251. -Mammogram dated 03/21/2018 was BI-RADS Category 2. - She will follow-up in 6 months with repeat labs and mammogram.  2.  Osteoporosis: -Her last DEXA scan was on 02/24/2017 which showed osteoporosis with a T score of -3.2. -She is receiving Prolia injections through her PCP.

## 2018-09-26 NOTE — Patient Instructions (Signed)
[facility] Discharge Instructions  Follow up in 6 months with labs and mammogram   Thank you for choosing [facility] at [facility] to provide your oncology and hematology care.  To afford each patient quality time with our provider, please arrive at least 15 minutes before your scheduled appointment time.   If you have a lab appointment with the Cancer Center please come in thru the Main Entrance and check in at the main information desk.  You need to re-schedule your appointment should you arrive 10 or more minutes late.  We strive to give you quality time with our providers, and arriving late affects you and other patients whose appointments are after yours.  Also, if you no show three or more times for appointments you may be dismissed from the clinic at the providers discretion.     Again, thank you for choosing [facility].  Our hope is that these requests will decrease the amount of time that you wait before being seen by our physicians.       _____________________________________________________________  Should you have questions after your visit to [facility], please contact our office at [phone] between the hours of 8:00 a.m. and 4:30 p.m.  Voicemails left after 4:00 p.m. will not be returned until the following business day.  For prescription refill requests, have your pharmacy contact our office and allow 72 hours.    Due to Covid, you will need to wear a mask upon entering the hospital. If you do not have a mask, a mask will be given to you at the Main Entrance upon arrival. For doctor visits, patients may have 1 support person with them. For treatment visits, patients can not have anyone with them due to social distancing guidelines and our immunocompromised population.      

## 2018-10-02 DIAGNOSIS — E782 Mixed hyperlipidemia: Secondary | ICD-10-CM | POA: Diagnosis not present

## 2018-10-02 DIAGNOSIS — I10 Essential (primary) hypertension: Secondary | ICD-10-CM | POA: Diagnosis not present

## 2018-10-02 DIAGNOSIS — F419 Anxiety disorder, unspecified: Secondary | ICD-10-CM | POA: Diagnosis not present

## 2018-10-19 DIAGNOSIS — F419 Anxiety disorder, unspecified: Secondary | ICD-10-CM | POA: Diagnosis not present

## 2018-10-19 DIAGNOSIS — I10 Essential (primary) hypertension: Secondary | ICD-10-CM | POA: Diagnosis not present

## 2018-10-19 DIAGNOSIS — E782 Mixed hyperlipidemia: Secondary | ICD-10-CM | POA: Diagnosis not present

## 2018-11-09 ENCOUNTER — Other Ambulatory Visit (HOSPITAL_COMMUNITY): Payer: Self-pay | Admitting: *Deleted

## 2018-11-09 DIAGNOSIS — D0512 Intraductal carcinoma in situ of left breast: Secondary | ICD-10-CM

## 2018-11-09 MED ORDER — TAMOXIFEN CITRATE 20 MG PO TABS: 20.0000 mg | ORAL_TABLET | Freq: Every day | ORAL | 3 refills | Status: DC

## 2018-12-01 DIAGNOSIS — F419 Anxiety disorder, unspecified: Secondary | ICD-10-CM | POA: Diagnosis not present

## 2018-12-01 DIAGNOSIS — I10 Essential (primary) hypertension: Secondary | ICD-10-CM | POA: Diagnosis not present

## 2018-12-01 DIAGNOSIS — E782 Mixed hyperlipidemia: Secondary | ICD-10-CM | POA: Diagnosis not present

## 2018-12-06 DIAGNOSIS — Z9889 Other specified postprocedural states: Secondary | ICD-10-CM | POA: Diagnosis not present

## 2018-12-06 DIAGNOSIS — D0512 Intraductal carcinoma in situ of left breast: Secondary | ICD-10-CM | POA: Diagnosis not present

## 2018-12-06 DIAGNOSIS — Z86000 Personal history of in-situ neoplasm of breast: Secondary | ICD-10-CM | POA: Diagnosis not present

## 2018-12-06 DIAGNOSIS — Z79810 Long term (current) use of selective estrogen receptor modulators (SERMs): Secondary | ICD-10-CM | POA: Diagnosis not present

## 2018-12-06 DIAGNOSIS — Z923 Personal history of irradiation: Secondary | ICD-10-CM | POA: Diagnosis not present

## 2018-12-06 DIAGNOSIS — Z08 Encounter for follow-up examination after completed treatment for malignant neoplasm: Secondary | ICD-10-CM | POA: Diagnosis not present

## 2018-12-08 DIAGNOSIS — E785 Hyperlipidemia, unspecified: Secondary | ICD-10-CM | POA: Diagnosis not present

## 2018-12-08 DIAGNOSIS — R7303 Prediabetes: Secondary | ICD-10-CM | POA: Diagnosis not present

## 2018-12-08 DIAGNOSIS — E782 Mixed hyperlipidemia: Secondary | ICD-10-CM | POA: Diagnosis not present

## 2018-12-08 DIAGNOSIS — I10 Essential (primary) hypertension: Secondary | ICD-10-CM | POA: Diagnosis not present

## 2018-12-08 DIAGNOSIS — R7301 Impaired fasting glucose: Secondary | ICD-10-CM | POA: Diagnosis not present

## 2018-12-12 DIAGNOSIS — M810 Age-related osteoporosis without current pathological fracture: Secondary | ICD-10-CM | POA: Diagnosis not present

## 2018-12-12 DIAGNOSIS — R7301 Impaired fasting glucose: Secondary | ICD-10-CM | POA: Diagnosis not present

## 2018-12-12 DIAGNOSIS — E782 Mixed hyperlipidemia: Secondary | ICD-10-CM | POA: Diagnosis not present

## 2018-12-12 DIAGNOSIS — Z0000 Encounter for general adult medical examination without abnormal findings: Secondary | ICD-10-CM | POA: Diagnosis not present

## 2018-12-12 DIAGNOSIS — I10 Essential (primary) hypertension: Secondary | ICD-10-CM | POA: Diagnosis not present

## 2018-12-12 DIAGNOSIS — Z23 Encounter for immunization: Secondary | ICD-10-CM | POA: Diagnosis not present

## 2018-12-12 DIAGNOSIS — F411 Generalized anxiety disorder: Secondary | ICD-10-CM | POA: Diagnosis not present

## 2018-12-12 DIAGNOSIS — R7303 Prediabetes: Secondary | ICD-10-CM | POA: Diagnosis not present

## 2018-12-12 DIAGNOSIS — D72829 Elevated white blood cell count, unspecified: Secondary | ICD-10-CM | POA: Diagnosis not present

## 2018-12-12 DIAGNOSIS — E875 Hyperkalemia: Secondary | ICD-10-CM | POA: Diagnosis not present

## 2018-12-12 DIAGNOSIS — F17210 Nicotine dependence, cigarettes, uncomplicated: Secondary | ICD-10-CM | POA: Diagnosis not present

## 2018-12-12 DIAGNOSIS — Z853 Personal history of malignant neoplasm of breast: Secondary | ICD-10-CM | POA: Diagnosis not present

## 2019-01-22 DIAGNOSIS — F419 Anxiety disorder, unspecified: Secondary | ICD-10-CM | POA: Diagnosis not present

## 2019-01-22 DIAGNOSIS — E7849 Other hyperlipidemia: Secondary | ICD-10-CM | POA: Diagnosis not present

## 2019-01-22 DIAGNOSIS — I10 Essential (primary) hypertension: Secondary | ICD-10-CM | POA: Diagnosis not present

## 2019-03-05 DIAGNOSIS — E7849 Other hyperlipidemia: Secondary | ICD-10-CM | POA: Diagnosis not present

## 2019-03-05 DIAGNOSIS — F419 Anxiety disorder, unspecified: Secondary | ICD-10-CM | POA: Diagnosis not present

## 2019-03-05 DIAGNOSIS — I10 Essential (primary) hypertension: Secondary | ICD-10-CM | POA: Diagnosis not present

## 2019-03-19 ENCOUNTER — Other Ambulatory Visit: Payer: Self-pay

## 2019-03-19 ENCOUNTER — Encounter (HOSPITAL_COMMUNITY)
Admission: RE | Admit: 2019-03-19 | Discharge: 2019-03-19 | Disposition: A | Discharge status: Home / Self Care | Payer: PPO | Source: Ambulatory Visit | Attending: Internal Medicine | Admitting: Internal Medicine

## 2019-03-19 ENCOUNTER — Other Ambulatory Visit (HOSPITAL_COMMUNITY): Payer: Self-pay | Admitting: Nurse Practitioner

## 2019-03-19 DIAGNOSIS — M810 Age-related osteoporosis without current pathological fracture: Secondary | ICD-10-CM | POA: Insufficient documentation

## 2019-03-19 DIAGNOSIS — Z9889 Other specified postprocedural states: Secondary | ICD-10-CM

## 2019-03-19 MED ORDER — DENOSUMAB 60 MG/ML ~~LOC~~ SOSY
60.0000 mg | PREFILLED_SYRINGE | Freq: Once | SUBCUTANEOUS | Status: AC
  Administered 2019-03-19: 60 mg via SUBCUTANEOUS

## 2019-03-27 ENCOUNTER — Inpatient Hospital Stay (HOSPITAL_COMMUNITY): Payer: PPO | Attending: Hematology

## 2019-03-27 ENCOUNTER — Ambulatory Visit (HOSPITAL_COMMUNITY)
Admission: RE | Admit: 2019-03-27 | Discharge: 2019-03-27 | Disposition: A | Discharge status: Home / Self Care | Payer: PPO | Source: Ambulatory Visit | Attending: Nurse Practitioner | Admitting: Nurse Practitioner

## 2019-03-27 ENCOUNTER — Other Ambulatory Visit: Payer: Self-pay

## 2019-03-27 ENCOUNTER — Encounter (HOSPITAL_COMMUNITY): Payer: PPO

## 2019-03-27 DIAGNOSIS — Z79810 Long term (current) use of selective estrogen receptor modulators (SERMs): Secondary | ICD-10-CM | POA: Diagnosis not present

## 2019-03-27 DIAGNOSIS — R928 Other abnormal and inconclusive findings on diagnostic imaging of breast: Secondary | ICD-10-CM | POA: Diagnosis not present

## 2019-03-27 DIAGNOSIS — Z170 Estrogen receptor positive status [ER+]: Secondary | ICD-10-CM | POA: Insufficient documentation

## 2019-03-27 DIAGNOSIS — Z79899 Other long term (current) drug therapy: Secondary | ICD-10-CM | POA: Insufficient documentation

## 2019-03-27 DIAGNOSIS — D0512 Intraductal carcinoma in situ of left breast: Secondary | ICD-10-CM | POA: Diagnosis not present

## 2019-03-27 DIAGNOSIS — C50912 Malignant neoplasm of unspecified site of left female breast: Secondary | ICD-10-CM | POA: Insufficient documentation

## 2019-03-27 DIAGNOSIS — F17210 Nicotine dependence, cigarettes, uncomplicated: Secondary | ICD-10-CM | POA: Insufficient documentation

## 2019-03-27 DIAGNOSIS — M810 Age-related osteoporosis without current pathological fracture: Secondary | ICD-10-CM | POA: Diagnosis not present

## 2019-03-27 DIAGNOSIS — Z780 Asymptomatic menopausal state: Secondary | ICD-10-CM | POA: Diagnosis not present

## 2019-03-27 DIAGNOSIS — Z9889 Other specified postprocedural states: Secondary | ICD-10-CM

## 2019-03-27 DIAGNOSIS — M85852 Other specified disorders of bone density and structure, left thigh: Secondary | ICD-10-CM | POA: Diagnosis not present

## 2019-03-27 DIAGNOSIS — Z853 Personal history of malignant neoplasm of breast: Secondary | ICD-10-CM | POA: Diagnosis not present

## 2019-03-27 LAB — CBC WITH DIFFERENTIAL/PLATELET
Abs Immature Granulocytes: 0.07 10*3/uL (ref 0.00–0.07)
Basophils Absolute: 0.1 10*3/uL (ref 0.0–0.1)
Basophils Relative: 1 %
Eosinophils Absolute: 0.1 10*3/uL (ref 0.0–0.5)
Eosinophils Relative: 1 %
HCT: 44.1 % (ref 36.0–46.0)
Hemoglobin: 13.6 g/dL (ref 12.0–15.0)
Immature Granulocytes: 1 %
Lymphocytes Relative: 32 %
Lymphs Abs: 3.2 10*3/uL (ref 0.7–4.0)
MCH: 28.8 pg (ref 26.0–34.0)
MCHC: 30.8 g/dL (ref 30.0–36.0)
MCV: 93.4 fL (ref 80.0–100.0)
Monocytes Absolute: 0.7 10*3/uL (ref 0.1–1.0)
Monocytes Relative: 7 %
Neutro Abs: 6 10*3/uL (ref 1.7–7.7)
Neutrophils Relative %: 58 %
Platelets: 282 10*3/uL (ref 150–400)
RBC: 4.72 MIL/uL (ref 3.87–5.11)
RDW: 13.5 % (ref 11.5–15.5)
WBC: 10 10*3/uL (ref 4.0–10.5)
nRBC: 0 % (ref 0.0–0.2)

## 2019-03-27 LAB — COMPREHENSIVE METABOLIC PANEL
ALT: 13 U/L (ref 0–44)
AST: 15 U/L (ref 15–41)
Albumin: 4 g/dL (ref 3.5–5.0)
Alkaline Phosphatase: 58 U/L (ref 38–126)
Anion gap: 10 (ref 5–15)
BUN: 11 mg/dL (ref 8–23)
CO2: 24 mmol/L (ref 22–32)
Calcium: 9 mg/dL (ref 8.9–10.3)
Chloride: 105 mmol/L (ref 98–111)
Creatinine, Ser: 0.8 mg/dL (ref 0.44–1.00)
GFR calc Af Amer: >60 mL/min (ref >60)
GFR calc non Af Amer: >60 mL/min (ref >60)
Glucose, Bld: 111 mg/dL — ABNORMAL HIGH (ref 70–99)
Potassium: 3.9 mmol/L (ref 3.5–5.1)
Sodium: 139 mmol/L (ref 135–145)
Total Bilirubin: 0.3 mg/dL (ref 0.3–1.2)
Total Protein: 7.2 g/dL (ref 6.5–8.1)

## 2019-03-27 LAB — VITAMIN B12: Vitamin B-12: 142 pg/mL — ABNORMAL LOW (ref 180–914)

## 2019-03-27 LAB — VITAMIN D 25 HYDROXY (VIT D DEFICIENCY, FRACTURES): Vit D, 25-Hydroxy: 37.95 ng/mL (ref 30–100)

## 2019-03-27 LAB — MAGNESIUM: Magnesium: 2 mg/dL (ref 1.7–2.4)

## 2019-03-27 LAB — LACTATE DEHYDROGENASE: LDH: 151 U/L (ref 98–192)

## 2019-04-03 ENCOUNTER — Encounter (HOSPITAL_COMMUNITY): Payer: Self-pay | Admitting: Nurse Practitioner

## 2019-04-03 ENCOUNTER — Encounter (HOSPITAL_COMMUNITY): Payer: Self-pay | Admitting: *Deleted

## 2019-04-03 ENCOUNTER — Other Ambulatory Visit: Payer: Self-pay

## 2019-04-03 ENCOUNTER — Inpatient Hospital Stay (HOSPITAL_BASED_OUTPATIENT_CLINIC_OR_DEPARTMENT_OTHER): Payer: PPO | Admitting: Nurse Practitioner

## 2019-04-03 VITALS — BP 137/74 | HR 86 | Temp 96.9°F | Resp 18 | Wt 190.6 lb

## 2019-04-03 DIAGNOSIS — C50912 Malignant neoplasm of unspecified site of left female breast: Secondary | ICD-10-CM | POA: Diagnosis not present

## 2019-04-03 DIAGNOSIS — Z87891 Personal history of nicotine dependence: Secondary | ICD-10-CM

## 2019-04-03 DIAGNOSIS — M8580 Other specified disorders of bone density and structure, unspecified site: Secondary | ICD-10-CM | POA: Diagnosis not present

## 2019-04-03 DIAGNOSIS — Z780 Asymptomatic menopausal state: Secondary | ICD-10-CM

## 2019-04-03 DIAGNOSIS — Z122 Encounter for screening for malignant neoplasm of respiratory organs: Secondary | ICD-10-CM

## 2019-04-03 DIAGNOSIS — D0512 Intraductal carcinoma in situ of left breast: Secondary | ICD-10-CM

## 2019-04-03 MED ORDER — CYANOCOBALAMIN 1000 MCG/ML IJ SOLN
1000.0000 ug | Freq: Once | INTRAMUSCULAR | Status: AC
  Administered 2019-04-03: 14:00:00 1000 ug via INTRAMUSCULAR

## 2019-04-03 MED ORDER — CYANOCOBALAMIN 1000 MCG/ML IJ SOLN
INTRAMUSCULAR | Status: AC
  Filled 2019-04-03: qty 1

## 2019-04-03 NOTE — Progress Notes (Signed)
[place], [place] [address]   CLINIC:  Medical Oncology/Hematology  PCP:  Melissa Squibb, MD [address] [place] [address] [phone]   REASON FOR VISIT: Follow-up for breast cancer  CURRENT THERAPY: Tamoxifen  BRIEF ONCOLOGIC HISTORY:  Oncology History  DCIS (ductal carcinoma in situ)  04/12/2017 Initial Diagnosis   DCIS (ductal carcinoma in situ)   09/03/2017 Genetic Testing   Negative genetic testing on the multi-cancer panel.  The Multi-Gene Panel offered by [facility] includes sequencing and/or deletion duplication testing of the following 84 genes: AIP, ALK, APC, ATM, AXIN2,BAP1,  BARD1, BLM, BMPR1A, BRCA1, BRCA2, BRIP1, CASR, CDC73, CDH1, CDK4, CDKN1B, CDKN1C, CDKN2A (p14ARF), CDKN2A (p16INK4a), CEBPA, CHEK2, CTNNA1, DICER1, DIS3L2, EGFR (c.2369C>T, p.Thr790Met variant only), EPCAM (Deletion/duplication testing only), FH, FLCN, GATA2, GPC3, GREM1 (Promoter region deletion/duplication testing only), HOXB13 (c.251G>A, p.Gly84Glu), HRAS, KIT, MAX, MEN1, MET, MITF (c.952G>A, p.Glu318Lys variant only), MLH1, MSH2, MSH3, MSH6, MUTYH, NBN, NF1, NF2, NTHL1, PALB2, PDGFRA, PHOX2B, PMS2, POLD1, POLE, POT1, PRKAR1A, PTCH1, PTEN, RAD50, RAD51C, RAD51D, RB1, RECQL4, RET, RUNX1, SDHAF2, SDHA (sequence changes only), SDHB, SDHC, SDHD, SMAD4, SMARCA4, SMARCB1, SMARCE1, STK11, SUFU, TERC, TERT, TMEM127, TP53, TSC1, TSC2, VHL, WRN and WT1.  The report date is September 03, 2017.      INTERVAL HISTORY:  Melissa Weaver 69 y.o. female returns for routine follow-up for breast cancer.  Patient reports she has been doing well since her last visit.  She denies any new lumps or bumps present.  She denies any new bone pain.  She denies any easy bruising or bleeding. Denies any nausea, vomiting, or diarrhea. Denies any new pains. Had not noticed any recent bleeding such as epistaxis, hematuria or hematochezia. Denies recent chest pain on exertion, shortness of breath  on minimal exertion, pre-syncopal episodes, or palpitations. Denies any numbness or tingling in hands or feet. Denies any recent fevers, infections, or recent hospitalizations. Patient reports appetite at 75% and energy level at 50%.  She is eating well maintain her weight at this time.    REVIEW OF SYSTEMS:  Review of Systems  Constitutional: Positive for fatigue.  Respiratory: Positive for cough (Due to smoking history) and shortness of breath (Due to smoking history).   Gastrointestinal: Positive for diarrhea.  Psychiatric/Behavioral: Positive for depression and sleep disturbance. The patient is nervous/anxious.   All other systems reviewed and are negative.    PAST MEDICAL/SURGICAL HISTORY:  Past Medical History:  Diagnosis Date  . Anxiety   . Arthritis   . Depression   . Family history of colon cancer   . Family history of melanoma   . Family history of pancreatic cancer   . HLD (hyperlipidemia)   . HTN (hypertension)   . Seizures ([place])    had a seizure as child, unknown etiology, no meds though. No seizures since 10 years.   Past Surgical History:  Procedure Laterality Date  . PARTIAL MASTECTOMY WITH NEEDLE LOCALIZATION Left 05/11/2017   Procedure: PARTIAL MASTECTOMY WITH NEEDLE LOCALIZATION;  Surgeon: Melissa Cagey, MD;  Location: [facility];  Service: General;  Laterality: Left;  . TUBAL LIGATION       SOCIAL HISTORY:  Social History   Socioeconomic History  . Marital status: Married    Spouse name: Not on file  . Number of children: Not on file  . Years of education: Not on file  . Highest education level: Not on file  Occupational History  . Not on file  Tobacco  Use  . Smoking status: Current Every Day Smoker    Packs/day: 1.00    Years: 40.00    Pack years: 40.00    Types: Cigarettes  . Smokeless tobacco: Never Used  Substance and Sexual Activity  . Alcohol use: Never  . Drug use: Never  . Sexual activity: Yes    Birth control/protection: None   Other Topics Concern  . Not on file  Social History Narrative  . Not on file   Social Determinants of Health   Financial Resource Strain:   . Difficulty of Paying Living Expenses:   Food Insecurity:   . Worried About Charity fundraiser in the Last Year:   . Arboriculturist in the Last Year:   Transportation Needs:   . Film/video editor (Medical):   Marland Kitchen Lack of Transportation (Non-Medical):   Physical Activity:   . Days of Exercise per Week:   . Minutes of Exercise per Session:   Stress:   . Feeling of Stress :   Social Connections:   . Frequency of Communication with Friends and Family:   . Frequency of Social Gatherings with Friends and Family:   . Attends Religious Services:   . Active Member of Clubs or Organizations:   . Attends Archivist Meetings:   Marland Kitchen Marital Status:   Intimate Partner Violence:   . Fear of Current or Ex-Partner:   . Emotionally Abused:   Marland Kitchen Physically Abused:   . Sexually Abused:     FAMILY HISTORY:  Family History  Problem Relation Age of Onset  . Pancreatic cancer Mother 31       d. 53  . Atrial fibrillation Father   . COPD Father   . Hypertension Father   . High Cholesterol Father   . Pancreatic cancer Maternal Aunt 75       d. 24  . Pancreatic cancer Maternal Uncle        dx and died in his 91s  . Lung cancer Maternal Grandfather 92       d. 53  . Heart attack Maternal Grandfather 92       d. 27  . Lung cancer Maternal Aunt   . Leukemia Maternal Uncle        dx in his 45s-40s  . Heart attack Maternal Uncle   . Melanoma Other        MGMs sister  . Colon cancer Other        MGMs mother    CURRENT MEDICATIONS:  Outpatient Encounter Medications as of 04/03/2019  Medication Sig  . ALPRAZolam (XANAX) 0.25 MG tablet Take 0.25 mg by mouth 3 (three) times daily as needed for anxiety.   Marland Kitchen atorvastatin (LIPITOR) 10 MG tablet Take 10 mg by mouth daily.   . cholecalciferol (VITAMIN D) 1000 units tablet Take 5,000 Units by  mouth daily.  Marland Kitchen denosumab (PROLIA) 60 MG/ML SOLN injection Inject 60 mg into the skin every 6 (six) months. Administer in upper arm, thigh, or abdomen  . escitalopram (LEXAPRO) 10 MG tablet Take 10 mg by mouth daily.   . Magnesium 400 MG TABS Take 200 mg by mouth daily.   . tamoxifen (NOLVADEX) 20 MG tablet Take 1 tablet (20 mg total) by mouth daily.  Marland Kitchen acetaminophen (TYLENOL) 650 MG CR tablet Take 1,300 mg by mouth daily as needed for pain.  . [EXPIRED] cyanocobalamin ((VITAMIN B-12)) injection 1,000 mcg    No facility-administered encounter medications on file as of  04/03/2019.    ALLERGIES:  No Known Allergies   PHYSICAL EXAM:  ECOG Performance status: 1  Vitals:   04/03/19 1256  BP: 137/74  Pulse: 86  Resp: 18  Temp: (!) 96.9 F (36.1 C)  SpO2: 96%   Filed Weights   04/03/19 1256  Weight: 190 lb 9.6 oz (86.5 kg)    Physical Exam Constitutional:      Appearance: Normal appearance. She is normal weight.  Cardiovascular:     Rate and Rhythm: Normal rate and regular rhythm.     Heart sounds: Normal heart sounds.  Pulmonary:     Effort: Pulmonary effort is normal.     Breath sounds: Normal breath sounds.  Abdominal:     General: Bowel sounds are normal.     Palpations: Abdomen is soft.  Musculoskeletal:        General: Swelling (Right shoulder pain and tenderness) present.  Skin:    General: Skin is warm.  Neurological:     Mental Status: She is alert and oriented to person, place, and time. Mental status is at baseline.  Psychiatric:        Mood and Affect: Mood normal.        Behavior: Behavior normal.        Thought Content: Thought content normal.        Judgment: Judgment normal.   Breast: No palpable masses, no skin changes or nipple discharge, no adenopathy. LEFT: Lumpectomy scar well-healed and within normal limits.  LABORATORY DATA:  I have reviewed the labs as listed.  CBC    Component Value Date/Time   WBC 10.0 03/27/2019 0904   RBC 4.72  03/27/2019 0904   HGB 13.6 03/27/2019 0904   HCT 44.1 03/27/2019 0904   PLT 282 03/27/2019 0904   MCV 93.4 03/27/2019 0904   MCH 28.8 03/27/2019 0904   MCHC 30.8 03/27/2019 0904   RDW 13.5 03/27/2019 0904   LYMPHSABS 3.2 03/27/2019 0904   MONOABS 0.7 03/27/2019 0904   EOSABS 0.1 03/27/2019 0904   BASOSABS 0.1 03/27/2019 0904   CMP Latest Ref Rng & Units 03/27/2019 09/19/2018 03/21/2018  Glucose 70 - 99 mg/dL 111(H) 100(H) 107(H)  BUN 8 - 23 mg/dL 11 6(L) 8  Creatinine 0.44 - 1.00 mg/dL 0.80 0.77 0.75  Sodium 135 - 145 mmol/L 139 141 138  Potassium 3.5 - 5.1 mmol/L 3.9 4.1 4.2  Chloride 98 - 111 mmol/L 105 106 105  CO2 22 - 32 mmol/L 24 26 24   Calcium 8.9 - 10.3 mg/dL 9.0 8.7(L) 8.7(L)  Total Protein 6.5 - 8.1 g/dL 7.2 7.2 7.3  Total Bilirubin 0.3 - 1.2 mg/dL 0.3 0.2(L) 0.6  Alkaline Phos 38 - 126 U/L 58 67 55  AST 15 - 41 U/L 15 14(L) 17  ALT 0 - 44 U/L 13 14 16     DIAGNOSTIC IMAGING:  I have independently reviewed the mammogram and DEXA scans and discussed with the patient.    I personally performed a face-to-face visit.  All questions were answered to patient's stated satisfaction. Encouraged patient to call with any new concerns or questions before his next visit to the cancer center and we can certain see him sooner, if needed.     ASSESSMENT & PLAN:   DCIS (ductal carcinoma in situ) 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 -Biopsy of the left breast on 03/31/2017, DCIS, ER/PR positive -Left lumpectomy on 05/11/2017, intermediate grade DCIS, margins negative. -  Risk reduction therapy was recommended with 5 years of tamoxifen/aromatase inhibitor.  Because of her osteoporosis tamoxifen was chosen. - Tamoxifen was started 07/2017 after radiation therapy was completed.  She is tolerating tamoxifen well. - Physical examination today showed left lumpectomy scar in the right upper outer quadrant is unchanged.  Left breast lymphedema of  the skin is present.  No palpable masses in bilateral breasts.  No palpable adenopathy. - Labs done on 03/27/2019 showed her potassium 3.9, creatinine 0.80, magnesium 2.0, WBC 10.0, hemoglobin 13.6, platelets 282. -Mammogram dated 03/27/2019 was BI-RADS Category 2 benign. - She will follow-up in 6 months with repeat labs.  2.  Osteoporosis: -Her last DEXA scan was on 03/27/2019 which showed osteoporosis with a T score of -3.0 slightly improved from last DEXA scan. -She is receiving Prolia injections through her PCP. -Her last Prolia injection was on 03/19/2019.  3.  Smoking history: -Patient reports she has smoked 1 pack/day for greater than 30 years. -We will set her up with a low-dose CT scan. -Education provided about lung cancer screening program. -Patient verbalized willingness to participate in program.      Orders placed this encounter:  Orders Placed This Encounter  Procedures  . CT CHEST LUNG CA SCREEN LOW DOSE W/O CM  . Lactate dehydrogenase  . CBC with Differential/Platelet  . Comprehensive metabolic panel  . Vitamin B12  . VITAMIN D 25 Hydroxy (Vit-D Deficiency, Fractures)  . Folate  . Methylmalonic acid, serum      Melissa Finders, FNP-C [place] [phone]

## 2019-04-03 NOTE — Assessment & Plan Note (Signed)
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 -Biopsy of the left breast on 03/31/2017, DCIS, ER/PR positive -Left lumpectomy on 05/11/2017, intermediate grade DCIS, margins negative. - Risk reduction therapy was recommended with 5 years of tamoxifen/aromatase inhibitor.  Because of her osteoporosis tamoxifen was chosen. - Tamoxifen was started 07/2017 after radiation therapy was completed.  She is tolerating tamoxifen well. - Physical examination today showed left lumpectomy scar in the right upper outer quadrant is unchanged.  Left breast lymphedema of the skin is present.  No palpable masses in bilateral breasts.  No palpable adenopathy. - Labs done on 03/27/2019 showed her potassium 3.9, creatinine 0.80, magnesium 2.0, WBC 10.0, hemoglobin 13.6, platelets 282. -Mammogram dated 03/27/2019 was BI-RADS Category 2 benign. - She will follow-up in 6 months with repeat labs.  2.  Osteoporosis: -Her last DEXA scan was on 03/27/2019 which showed osteoporosis with a T score of -3.0 slightly improved from last DEXA scan. -She is receiving Prolia injections through her PCP. -Her last Prolia injection was on 03/19/2019.  3.  Smoking history: -Patient reports she has smoked 1 pack/day for greater than 30 years. -We will set her up with a low-dose CT scan. -Education provided about lung cancer screening program. -Patient verbalized willingness to participate in program.

## 2019-04-03 NOTE — Patient Instructions (Signed)
[facility] at [facility] Discharge Instructions  Follow up in 6 months with labs    Thank you for choosing [facility] at [facility] to provide your oncology and hematology care.  To afford each patient quality time with our provider, please arrive at least 15 minutes before your scheduled appointment time.   If you have a lab appointment with the Cancer Center please come in thru the Main Entrance and check in at the main information desk.  You need to re-schedule your appointment should you arrive 10 or more minutes late.  We strive to give you quality time with our providers, and arriving late affects you and other patients whose appointments are after yours.  Also, if you no show three or more times for appointments you may be dismissed from the clinic at the providers discretion.     Again, thank you for choosing [facility].  Our hope is that these requests will decrease the amount of time that you wait before being seen by our physicians.       _____________________________________________________________  Should you have questions after your visit to [facility], please contact our office at [phone] between the hours of 8:00 a.m. and 4:30 p.m.  Voicemails left after 4:00 p.m. will not be returned until the following business day.  For prescription refill requests, have your pharmacy contact our office and allow 72 hours.    Due to Covid, you will need to wear a mask upon entering the hospital. If you do not have a mask, a mask will be given to you at the Main Entrance upon arrival. For doctor visits, patients may have 1 support person with them. For treatment visits, patients can not have anyone with them due to social distancing guidelines and our immunocompromised population.      

## 2019-04-16 ENCOUNTER — Ambulatory Visit (HOSPITAL_COMMUNITY)
Admission: RE | Admit: 2019-04-16 | Discharge: 2019-04-16 | Disposition: A | Discharge status: Home / Self Care | Payer: PPO | Source: Ambulatory Visit | Attending: Nurse Practitioner | Admitting: Nurse Practitioner

## 2019-04-16 ENCOUNTER — Other Ambulatory Visit: Payer: Self-pay

## 2019-04-16 DIAGNOSIS — Z122 Encounter for screening for malignant neoplasm of respiratory organs: Secondary | ICD-10-CM | POA: Diagnosis not present

## 2019-04-16 DIAGNOSIS — Z87891 Personal history of nicotine dependence: Secondary | ICD-10-CM | POA: Diagnosis not present

## 2019-04-16 DIAGNOSIS — F17210 Nicotine dependence, cigarettes, uncomplicated: Secondary | ICD-10-CM | POA: Diagnosis not present

## 2019-04-17 ENCOUNTER — Encounter (HOSPITAL_COMMUNITY): Payer: Self-pay | Admitting: *Deleted

## 2019-04-17 NOTE — Progress Notes (Signed)
Patient notified via mail of LDCT lung cancer screening results with recommendations to follow up in 12 months.  Also notified of incidental findings and need to follow up with PCP.  Patient's referring provider was sent a copy of results.    IMPRESSION: 1. Lung-RADS Category 2, benign appearance or behavior. Continue annual screening with low-dose chest CT without contrast in 12 months. 2. Two vessel coronary atherosclerosis. 3. Left adrenal 2.0 cm nodule, partially visualized, indeterminate. Follow-up adrenal protocol CT abdomen without and with IV contrast can be considered in 12 months. This recommendation follows ACR consensus guidelines: Management of Incidental Adrenal Masses: A White Paper of the ACR Incidental Findings Committee. J Am Coll Radiol 2017;14:1038-1044. 4. Aortic Atherosclerosis (ICD10-I70.0) and Emphysema (ICD10-J43.9).

## 2019-05-30 DIAGNOSIS — I10 Essential (primary) hypertension: Secondary | ICD-10-CM | POA: Diagnosis not present

## 2019-05-30 DIAGNOSIS — F419 Anxiety disorder, unspecified: Secondary | ICD-10-CM | POA: Diagnosis not present

## 2019-05-30 DIAGNOSIS — E7849 Other hyperlipidemia: Secondary | ICD-10-CM | POA: Diagnosis not present

## 2019-06-12 DIAGNOSIS — C50919 Malignant neoplasm of unspecified site of unspecified female breast: Secondary | ICD-10-CM | POA: Diagnosis not present

## 2019-06-12 DIAGNOSIS — D72829 Elevated white blood cell count, unspecified: Secondary | ICD-10-CM | POA: Diagnosis not present

## 2019-06-12 DIAGNOSIS — E7849 Other hyperlipidemia: Secondary | ICD-10-CM | POA: Diagnosis not present

## 2019-06-12 DIAGNOSIS — E039 Hypothyroidism, unspecified: Secondary | ICD-10-CM | POA: Diagnosis not present

## 2019-06-12 DIAGNOSIS — E785 Hyperlipidemia, unspecified: Secondary | ICD-10-CM | POA: Diagnosis not present

## 2019-06-12 DIAGNOSIS — E559 Vitamin D deficiency, unspecified: Secondary | ICD-10-CM | POA: Diagnosis not present

## 2019-06-12 DIAGNOSIS — E875 Hyperkalemia: Secondary | ICD-10-CM | POA: Diagnosis not present

## 2019-06-12 DIAGNOSIS — E782 Mixed hyperlipidemia: Secondary | ICD-10-CM | POA: Diagnosis not present

## 2019-06-12 DIAGNOSIS — E669 Obesity, unspecified: Secondary | ICD-10-CM | POA: Diagnosis not present

## 2019-06-12 DIAGNOSIS — F17200 Nicotine dependence, unspecified, uncomplicated: Secondary | ICD-10-CM | POA: Diagnosis not present

## 2019-06-12 DIAGNOSIS — F411 Generalized anxiety disorder: Secondary | ICD-10-CM | POA: Diagnosis not present

## 2019-06-12 DIAGNOSIS — F17210 Nicotine dependence, cigarettes, uncomplicated: Secondary | ICD-10-CM | POA: Diagnosis not present

## 2019-06-15 ENCOUNTER — Other Ambulatory Visit (HOSPITAL_COMMUNITY): Payer: Self-pay | Admitting: Internal Medicine

## 2019-06-15 DIAGNOSIS — Z23 Encounter for immunization: Secondary | ICD-10-CM | POA: Diagnosis not present

## 2019-06-15 DIAGNOSIS — F419 Anxiety disorder, unspecified: Secondary | ICD-10-CM | POA: Diagnosis not present

## 2019-06-15 DIAGNOSIS — E875 Hyperkalemia: Secondary | ICD-10-CM | POA: Diagnosis not present

## 2019-06-15 DIAGNOSIS — R7301 Impaired fasting glucose: Secondary | ICD-10-CM | POA: Diagnosis not present

## 2019-06-15 DIAGNOSIS — F411 Generalized anxiety disorder: Secondary | ICD-10-CM | POA: Diagnosis not present

## 2019-06-15 DIAGNOSIS — I10 Essential (primary) hypertension: Secondary | ICD-10-CM | POA: Diagnosis not present

## 2019-06-15 DIAGNOSIS — R7303 Prediabetes: Secondary | ICD-10-CM | POA: Diagnosis not present

## 2019-06-15 DIAGNOSIS — M810 Age-related osteoporosis without current pathological fracture: Secondary | ICD-10-CM | POA: Diagnosis not present

## 2019-06-15 DIAGNOSIS — Z0001 Encounter for general adult medical examination with abnormal findings: Secondary | ICD-10-CM | POA: Diagnosis not present

## 2019-06-15 DIAGNOSIS — Z853 Personal history of malignant neoplasm of breast: Secondary | ICD-10-CM | POA: Diagnosis not present

## 2019-06-15 DIAGNOSIS — D72829 Elevated white blood cell count, unspecified: Secondary | ICD-10-CM | POA: Diagnosis not present

## 2019-06-15 DIAGNOSIS — E782 Mixed hyperlipidemia: Secondary | ICD-10-CM | POA: Diagnosis not present

## 2019-06-15 DIAGNOSIS — R599 Enlarged lymph nodes, unspecified: Secondary | ICD-10-CM

## 2019-06-15 DIAGNOSIS — F17210 Nicotine dependence, cigarettes, uncomplicated: Secondary | ICD-10-CM | POA: Diagnosis not present

## 2019-06-21 ENCOUNTER — Ambulatory Visit (HOSPITAL_COMMUNITY)
Admission: RE | Admit: 2019-06-21 | Discharge: 2019-06-21 | Disposition: A | Discharge status: Home / Self Care | Payer: PPO | Source: Ambulatory Visit | Attending: Internal Medicine | Admitting: Internal Medicine

## 2019-06-21 ENCOUNTER — Ambulatory Visit (HOSPITAL_COMMUNITY): Payer: PPO

## 2019-06-21 ENCOUNTER — Other Ambulatory Visit: Payer: Self-pay

## 2019-06-21 ENCOUNTER — Encounter (HOSPITAL_COMMUNITY): Payer: Self-pay

## 2019-06-21 DIAGNOSIS — R599 Enlarged lymph nodes, unspecified: Secondary | ICD-10-CM | POA: Diagnosis not present

## 2019-06-21 DIAGNOSIS — R221 Localized swelling, mass and lump, neck: Secondary | ICD-10-CM | POA: Diagnosis not present

## 2019-06-22 DIAGNOSIS — D519 Vitamin B12 deficiency anemia, unspecified: Secondary | ICD-10-CM | POA: Diagnosis not present

## 2019-06-29 DIAGNOSIS — D519 Vitamin B12 deficiency anemia, unspecified: Secondary | ICD-10-CM | POA: Diagnosis not present

## 2019-07-06 DIAGNOSIS — D519 Vitamin B12 deficiency anemia, unspecified: Secondary | ICD-10-CM | POA: Diagnosis not present

## 2019-07-13 DIAGNOSIS — D519 Vitamin B12 deficiency anemia, unspecified: Secondary | ICD-10-CM | POA: Diagnosis not present

## 2019-07-19 DIAGNOSIS — E7849 Other hyperlipidemia: Secondary | ICD-10-CM | POA: Diagnosis not present

## 2019-07-19 DIAGNOSIS — F419 Anxiety disorder, unspecified: Secondary | ICD-10-CM | POA: Diagnosis not present

## 2019-07-19 DIAGNOSIS — I10 Essential (primary) hypertension: Secondary | ICD-10-CM | POA: Diagnosis not present

## 2019-07-19 DIAGNOSIS — G720 Drug-induced myopathy: Secondary | ICD-10-CM | POA: Diagnosis not present

## 2019-07-23 DIAGNOSIS — D519 Vitamin B12 deficiency anemia, unspecified: Secondary | ICD-10-CM | POA: Diagnosis not present

## 2019-08-22 DIAGNOSIS — M810 Age-related osteoporosis without current pathological fracture: Secondary | ICD-10-CM | POA: Diagnosis not present

## 2019-08-22 DIAGNOSIS — I10 Essential (primary) hypertension: Secondary | ICD-10-CM | POA: Diagnosis not present

## 2019-08-22 DIAGNOSIS — E539 Vitamin B deficiency, unspecified: Secondary | ICD-10-CM | POA: Diagnosis not present

## 2019-08-22 DIAGNOSIS — E7849 Other hyperlipidemia: Secondary | ICD-10-CM | POA: Diagnosis not present

## 2019-08-22 DIAGNOSIS — G720 Drug-induced myopathy: Secondary | ICD-10-CM | POA: Diagnosis not present

## 2019-08-22 DIAGNOSIS — F419 Anxiety disorder, unspecified: Secondary | ICD-10-CM | POA: Diagnosis not present

## 2019-08-24 DIAGNOSIS — D519 Vitamin B12 deficiency anemia, unspecified: Secondary | ICD-10-CM | POA: Diagnosis not present

## 2019-09-19 ENCOUNTER — Encounter (HOSPITAL_COMMUNITY): Admission: RE | Admit: 2019-09-19 | Payer: PPO | Source: Ambulatory Visit

## 2019-09-21 DIAGNOSIS — D519 Vitamin B12 deficiency anemia, unspecified: Secondary | ICD-10-CM | POA: Diagnosis not present

## 2019-10-01 ENCOUNTER — Encounter (HOSPITAL_COMMUNITY)
Admission: RE | Admit: 2019-10-01 | Discharge: 2019-10-01 | Disposition: A | Discharge status: Home / Self Care | Payer: PPO | Attending: Internal Medicine | Admitting: Internal Medicine

## 2019-10-01 ENCOUNTER — Other Ambulatory Visit: Payer: Self-pay

## 2019-10-01 DIAGNOSIS — M810 Age-related osteoporosis without current pathological fracture: Secondary | ICD-10-CM | POA: Diagnosis not present

## 2019-10-01 MED ORDER — DENOSUMAB 60 MG/ML ~~LOC~~ SOSY
60.0000 mg | PREFILLED_SYRINGE | Freq: Once | SUBCUTANEOUS | Status: AC
  Administered 2019-10-01: 60 mg via SUBCUTANEOUS

## 2019-10-02 ENCOUNTER — Other Ambulatory Visit (HOSPITAL_COMMUNITY): Payer: Self-pay

## 2019-10-02 ENCOUNTER — Other Ambulatory Visit: Payer: Self-pay

## 2019-10-02 ENCOUNTER — Inpatient Hospital Stay (HOSPITAL_COMMUNITY): Payer: PPO | Attending: Hematology

## 2019-10-02 DIAGNOSIS — E7849 Other hyperlipidemia: Secondary | ICD-10-CM | POA: Diagnosis not present

## 2019-10-02 DIAGNOSIS — D0512 Intraductal carcinoma in situ of left breast: Secondary | ICD-10-CM | POA: Diagnosis not present

## 2019-10-02 DIAGNOSIS — I10 Essential (primary) hypertension: Secondary | ICD-10-CM | POA: Diagnosis not present

## 2019-10-02 DIAGNOSIS — G720 Drug-induced myopathy: Secondary | ICD-10-CM | POA: Diagnosis not present

## 2019-10-02 DIAGNOSIS — F419 Anxiety disorder, unspecified: Secondary | ICD-10-CM | POA: Diagnosis not present

## 2019-10-02 LAB — COMPREHENSIVE METABOLIC PANEL
ALT: 13 U/L (ref 0–44)
AST: 12 U/L — ABNORMAL LOW (ref 15–41)
Albumin: 3.7 g/dL (ref 3.5–5.0)
Alkaline Phosphatase: 53 U/L (ref 38–126)
Anion gap: 9 (ref 5–15)
BUN: 9 mg/dL (ref 8–23)
CO2: 26 mmol/L (ref 22–32)
Calcium: 8.8 mg/dL — ABNORMAL LOW (ref 8.9–10.3)
Chloride: 104 mmol/L (ref 98–111)
Creatinine, Ser: 0.75 mg/dL (ref 0.44–1.00)
GFR calc Af Amer: >60 mL/min (ref >60)
GFR calc non Af Amer: >60 mL/min (ref >60)
Glucose, Bld: 104 mg/dL — ABNORMAL HIGH (ref 70–99)
Potassium: 4.2 mmol/L (ref 3.5–5.1)
Sodium: 139 mmol/L (ref 135–145)
Total Bilirubin: 0.6 mg/dL (ref 0.3–1.2)
Total Protein: 6.8 g/dL (ref 6.5–8.1)

## 2019-10-02 LAB — CBC WITH DIFFERENTIAL/PLATELET
Abs Immature Granulocytes: 0.05 10*3/uL (ref 0.00–0.07)
Basophils Absolute: 0.1 10*3/uL (ref 0.0–0.1)
Basophils Relative: 1 %
Eosinophils Absolute: 0.1 10*3/uL (ref 0.0–0.5)
Eosinophils Relative: 1 %
HCT: 41.7 % (ref 36.0–46.0)
Hemoglobin: 13.2 g/dL (ref 12.0–15.0)
Immature Granulocytes: 1 %
Lymphocytes Relative: 39 %
Lymphs Abs: 3.7 10*3/uL (ref 0.7–4.0)
MCH: 29.3 pg (ref 26.0–34.0)
MCHC: 31.7 g/dL (ref 30.0–36.0)
MCV: 92.7 fL (ref 80.0–100.0)
Monocytes Absolute: 0.7 10*3/uL (ref 0.1–1.0)
Monocytes Relative: 7 %
Neutro Abs: 5 10*3/uL (ref 1.7–7.7)
Neutrophils Relative %: 51 %
Platelets: 266 10*3/uL (ref 150–400)
RBC: 4.5 MIL/uL (ref 3.87–5.11)
RDW: 13.4 % (ref 11.5–15.5)
WBC: 9.7 10*3/uL (ref 4.0–10.5)
nRBC: 0 % (ref 0.0–0.2)

## 2019-10-02 LAB — FOLATE: Folate: 12.3 ng/mL (ref >5.9)

## 2019-10-02 LAB — VITAMIN B12: Vitamin B-12: 1091 pg/mL — ABNORMAL HIGH (ref 180–914)

## 2019-10-02 LAB — VITAMIN D 25 HYDROXY (VIT D DEFICIENCY, FRACTURES): Vit D, 25-Hydroxy: 29.54 ng/mL — ABNORMAL LOW (ref 30–100)

## 2019-10-02 LAB — LACTATE DEHYDROGENASE: LDH: 149 U/L (ref 98–192)

## 2019-10-08 DIAGNOSIS — L304 Erythema intertrigo: Secondary | ICD-10-CM | POA: Diagnosis not present

## 2019-10-08 DIAGNOSIS — L570 Actinic keratosis: Secondary | ICD-10-CM | POA: Diagnosis not present

## 2019-10-08 DIAGNOSIS — D485 Neoplasm of uncertain behavior of skin: Secondary | ICD-10-CM | POA: Diagnosis not present

## 2019-10-08 DIAGNOSIS — L814 Other melanin hyperpigmentation: Secondary | ICD-10-CM | POA: Diagnosis not present

## 2019-10-08 DIAGNOSIS — D2271 Melanocytic nevi of right lower limb, including hip: Secondary | ICD-10-CM | POA: Diagnosis not present

## 2019-10-08 DIAGNOSIS — L821 Other seborrheic keratosis: Secondary | ICD-10-CM | POA: Diagnosis not present

## 2019-10-09 ENCOUNTER — Inpatient Hospital Stay (HOSPITAL_BASED_OUTPATIENT_CLINIC_OR_DEPARTMENT_OTHER): Payer: PPO | Admitting: Nurse Practitioner

## 2019-10-09 ENCOUNTER — Other Ambulatory Visit: Payer: Self-pay

## 2019-10-09 DIAGNOSIS — D0512 Intraductal carcinoma in situ of left breast: Secondary | ICD-10-CM | POA: Diagnosis not present

## 2019-10-09 NOTE — Assessment & Plan Note (Addendum)
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 -Biopsy of the left breast on 03/31/2017, DCIS, ER/PR positive -Left lumpectomy on 05/11/2017, intermediate grade DCIS, margins negative. - Risk reduction therapy was recommended with 5 years of tamoxifen/aromatase inhibitor.  Because of her osteoporosis tamoxifen was chosen. - Tamoxifen was started 07/2017 after radiation therapy was completed.  She is tolerating tamoxifen well. -Mammogram dated 03/27/2019 was BI-RADS Category 2 benign. -Labs done on 10/02/2019 showed WBC 9.7, hemoglobin 13.2, platelets 266, creatinine 0.75. - She will follow-up in 6 months with repeat labs and mammogram.  2.  Osteoporosis: -Her last DEXA scan was on 03/27/2019 which showed osteoporosis with a T score of -3.0 slightly improved from last DEXA scan. -She is receiving Prolia injections through her PCP. -Her last Prolia injection was on 03/19/2019.  3.  Smoking history: -Patient reports she has smoked 1 pack/day for greater than 30 years. -We will set her up with a low-dose CT scan. -Education provided about lung cancer screening program. -Patient verbalized willingness to participate in program.

## 2019-10-09 NOTE — Progress Notes (Signed)
Melissa Weaver:    [name], MD [place] [address]   DIAGNOSIS: Breast cancer   SUMMARY OF ONCOLOGIC HISTORY: Oncology History  DCIS (ductal carcinoma in situ)  04/12/2017 Initial Diagnosis   DCIS (ductal carcinoma in situ)   09/03/2017 Genetic Testing   Negative genetic testing on the multi-cancer panel.  The Multi-Gene Panel offered by [facility] includes sequencing and/or deletion duplication testing of the following 84 genes: AIP, ALK, APC, ATM, AXIN2,BAP1,  BARD1, BLM, BMPR1A, BRCA1, BRCA2, BRIP1, CASR, CDC73, CDH1, CDK4, CDKN1B, CDKN1C, CDKN2A (p14ARF), CDKN2A (p16INK4a), CEBPA, CHEK2, CTNNA1, DICER1, DIS3L2, EGFR (c.2369C>T, p.Thr790Met variant only), EPCAM (Deletion/duplication testing only), FH, FLCN, GATA2, GPC3, GREM1 (Promoter region deletion/duplication testing only), HOXB13 (c.251G>A, p.Gly84Glu), HRAS, KIT, MAX, MEN1, MET, MITF (c.952G>A, p.Glu318Lys variant only), MLH1, MSH2, MSH3, MSH6, MUTYH, NBN, NF1, NF2, NTHL1, PALB2, PDGFRA, PHOX2B, PMS2, POLD1, POLE, POT1, PRKAR1A, PTCH1, PTEN, RAD50, RAD51C, RAD51D, RB1, RECQL4, RET, RUNX1, SDHAF2, SDHA (sequence changes only), SDHB, SDHC, SDHD, SMAD4, SMARCA4, SMARCB1, SMARCE1, STK11, SUFU, TERC, TERT, TMEM127, TP53, TSC1, TSC2, VHL, WRN and WT1.  The report date is September 03, 2017.     CURRENT THERAPY: Surveillance  INTERVAL HISTORY: Melissa Weaver 69 y.o. female returns for for breast cancer. Patient reports she is doing well since her last visit. She denies any new lumps or bumps present. She denies any bone pain. Denies any nausea, vomiting, or diarrhea. Denies any new pains. Had not noticed any recent bleeding such as epistaxis, hematuria or hematochezia. Denies recent chest pain on exertion, shortness of breath on minimal exertion, pre-syncopal episodes, or palpitations. Denies any numbness or tingling in hands or feet. Denies any recent fevers, infections, or recent  hospitalizations. Patient reports appetite at 100% and energy level at 100%. She is eating well maintain her weight at this time.    Patient Active Problem List   Diagnosis Date Noted  . Genetic testing 09/06/2017  . Family history of pancreatic cancer   . Family history of colon cancer   . Family history of melanoma   . 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 HLD (hyperlipidemia) 04/07/2017  . Anxiety state 04/07/2017  . Depression 04/07/2017  . HTN (hypertension) 04/07/2017    has No Known Allergies.  MEDICAL HISTORY: Past Medical History:  Diagnosis Date  . Anxiety   . Arthritis   . Depression   . Family history of colon cancer   . Family history of melanoma   . Family history of pancreatic cancer   . HLD (hyperlipidemia)   . HTN (hypertension)   . Seizures ([place])    had a seizure as child, unknown etiology, no meds though. No seizures since 10 years.    SURGICAL HISTORY: Past Surgical History:  Procedure Laterality Date  . PARTIAL MASTECTOMY WITH NEEDLE LOCALIZATION Left 05/11/2017   Procedure: PARTIAL MASTECTOMY WITH NEEDLE LOCALIZATION;  Surgeon: Virl Cagey, MD;  Location: [facility];  Service: General;  Laterality: Left;  . TUBAL LIGATION      SOCIAL HISTORY: Social History   Socioeconomic History  . Marital status: Married    Spouse name: Not on file  . Number of children: Not on file  . Years of education: Not on file  . Highest education level: Not on file  Occupational History  . Not on file  Tobacco Use  . Smoking status: Current Every Day Smoker    Packs/day: 1.00    Years: 40.00  Pack years: 40.00    Types: Cigarettes  . Smokeless tobacco: Never Used  Vaping Use  . Vaping Use: Never used  Substance and Sexual Activity  . Alcohol use: Never  . Drug use: Never  . Sexual activity: Yes    Birth control/protection: None  Other Topics Concern  . Not on file  Social History Narrative  . Not on file   Social Determinants of  Health   Financial Resource Strain:   . Difficulty of Paying Living Expenses: Not on file  Food Insecurity:   . Worried About Charity fundraiser in the Last Year: Not on file  . Ran Out of Food in the Last Year: Not on file  Transportation Needs:   . Lack of Transportation (Medical): Not on file  . Lack of Transportation (Non-Medical): Not on file  Physical Activity:   . Days of Exercise per Week: Not on file  . Minutes of Exercise per Session: Not on file  Stress:   . Feeling of Stress : Not on file  Social Connections:   . Frequency of Communication with Friends and Family: Not on file  . Frequency of Social Gatherings with Friends and Family: Not on file  . Attends Religious Services: Not on file  . Active Member of Clubs or Organizations: Not on file  . Attends Archivist Meetings: Not on file  . Marital Status: Not on file  Intimate Partner Violence:   . Fear of Current or Ex-Partner: Not on file  . Emotionally Abused: Not on file  . Physically Abused: Not on file  . Sexually Abused: Not on file    FAMILY HISTORY: Family History  Problem Relation Age of Onset  . Pancreatic cancer Mother 64       d. 27  . Atrial fibrillation Father   . COPD Father   . Hypertension Father   . High Cholesterol Father   . Pancreatic cancer Maternal Aunt 75       d. 13  . Pancreatic cancer Maternal Uncle        dx and died in his 61s  . Lung cancer Maternal Grandfather 92       d. 50  . Heart attack Maternal Grandfather 92       d. 7  . Lung cancer Maternal Aunt   . Leukemia Maternal Uncle        dx in his 85s-40s  . Heart attack Maternal Uncle   . Melanoma Other        MGMs sister  . Colon cancer Other        MGMs mother    Review of Systems  All other systems reviewed and are negative.   Vital signs: -Deferred due to telephone visit  Physical Exam -Deferred due to telephone visit -Patient was alert and oriented with phone and in no acute  distress   LABORATORY DATA:  CBC    Component Value Date/Time   WBC 9.7 10/02/2019 1314   RBC 4.50 10/02/2019 1314   HGB 13.2 10/02/2019 1314   HCT 41.7 10/02/2019 1314   PLT 266 10/02/2019 1314   MCV 92.7 10/02/2019 1314   MCH 29.3 10/02/2019 1314   MCHC 31.7 10/02/2019 1314   RDW 13.4 10/02/2019 1314   LYMPHSABS 3.7 10/02/2019 1314   MONOABS 0.7 10/02/2019 1314   EOSABS 0.1 10/02/2019 1314   BASOSABS 0.1 10/02/2019 1314    CMP     Component Value Date/Time   NA 139  10/02/2019 1314   K 4.2 10/02/2019 1314   CL 104 10/02/2019 1314   CO2 26 10/02/2019 1314   GLUCOSE 104 (H) 10/02/2019 1314   BUN 9 10/02/2019 1314   CREATININE 0.75 10/02/2019 1314   CALCIUM 8.8 (L) 10/02/2019 1314   PROT 6.8 10/02/2019 1314   ALBUMIN 3.7 10/02/2019 1314   AST 12 (L) 10/02/2019 1314   ALT 13 10/02/2019 1314   ALKPHOS 53 10/02/2019 1314   BILITOT 0.6 10/02/2019 1314   GFRNONAA >60 10/02/2019 1314   GFRAA >60 10/02/2019 1314    All questions were answered to patient's stated satisfaction. Encouraged patient to call with any new concerns or questions before his next visit to the cancer center and we can certain see him sooner, if needed.     ASSESSMENT and THERAPY PLAN:   DCIS (ductal carcinoma in situ) 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 -Biopsy of the left breast on 03/31/2017, DCIS, ER/PR positive -Left lumpectomy on 05/11/2017, intermediate grade DCIS, margins negative. - Risk reduction therapy was recommended with 5 years of tamoxifen/aromatase inhibitor.  Because of her osteoporosis tamoxifen was chosen. - Tamoxifen was started 07/2017 after radiation therapy was completed.  She is tolerating tamoxifen well. -Mammogram dated 03/27/2019 was BI-RADS Category 2 benign. -Labs done on 10/02/2019 showed WBC 9.7, hemoglobin 13.2, platelets 266, creatinine 0.75. - She will follow-Weaver in 6 months with repeat labs and mammogram.  2.   Osteoporosis: -Her last DEXA scan was on 03/27/2019 which showed osteoporosis with a T score of -3.0 slightly improved from last DEXA scan. -She is receiving Prolia injections through her PCP. -Her last Prolia injection was on 03/19/2019.  3.  Smoking history: -Patient reports she has smoked 1 pack/day for greater than 30 years. -We will set her Weaver with a low-dose CT scan. -Education provided about lung cancer screening program. -Patient verbalized willingness to participate in program.   Orders Placed This Encounter  Procedures  . MM DIAG BREAST TOMO BILATERAL    Standing Status:   Future    Standing Expiration Date:   10/08/2020    Order Specific Question:   Reason for Exam (SYMPTOM  OR DIAGNOSIS REQUIRED)    Answer:   breast cancer    Order Specific Question:   Preferred imaging location?    Answer:   [facility]  . Lactate dehydrogenase    Standing Status:   Future    Standing Expiration Date:   10/08/2020  . CBC with Differential/Platelet    Standing Status:   Future    Standing Expiration Date:   10/08/2020  . Comprehensive metabolic panel    Standing Status:   Future    Standing Expiration Date:   10/08/2020  . Vitamin B12    Standing Status:   Future    Standing Expiration Date:   10/08/2020  . VITAMIN D 25 Hydroxy (Vit-D Deficiency, Fractures)    Standing Status:   Future    Standing Expiration Date:   10/08/2020    All questions were answered. The patient knows to call the clinic with any problems, questions or concerns. We can certainly see the patient much sooner if necessary. This note was electronically signed.  I provided 28 minutes of non face-to-face telephone visit time during this encounter, and > 50% was spent counseling as documented under my assessment & plan.   Glennie Isle, NP-C 10/09/2019

## 2019-10-11 LAB — METHYLMALONIC ACID, SERUM: Methylmalonic Acid, Quantitative: 93 nmol/L (ref 0–378)

## 2019-10-19 DIAGNOSIS — D519 Vitamin B12 deficiency anemia, unspecified: Secondary | ICD-10-CM | POA: Diagnosis not present

## 2019-10-25 DIAGNOSIS — D485 Neoplasm of uncertain behavior of skin: Secondary | ICD-10-CM | POA: Diagnosis not present

## 2019-10-25 DIAGNOSIS — L905 Scar conditions and fibrosis of skin: Secondary | ICD-10-CM | POA: Diagnosis not present

## 2019-11-23 DIAGNOSIS — D519 Vitamin B12 deficiency anemia, unspecified: Secondary | ICD-10-CM | POA: Diagnosis not present

## 2019-11-30 DIAGNOSIS — D519 Vitamin B12 deficiency anemia, unspecified: Secondary | ICD-10-CM | POA: Diagnosis not present

## 2019-11-30 DIAGNOSIS — M810 Age-related osteoporosis without current pathological fracture: Secondary | ICD-10-CM | POA: Diagnosis not present

## 2019-11-30 DIAGNOSIS — I10 Essential (primary) hypertension: Secondary | ICD-10-CM | POA: Diagnosis not present

## 2019-11-30 DIAGNOSIS — E7849 Other hyperlipidemia: Secondary | ICD-10-CM | POA: Diagnosis not present

## 2019-12-17 DIAGNOSIS — E539 Vitamin B deficiency, unspecified: Secondary | ICD-10-CM | POA: Diagnosis not present

## 2019-12-17 DIAGNOSIS — Z712 Person consulting for explanation of examination or test findings: Secondary | ICD-10-CM | POA: Diagnosis not present

## 2019-12-17 DIAGNOSIS — Z6834 Body mass index (BMI) 34.0-34.9, adult: Secondary | ICD-10-CM | POA: Diagnosis not present

## 2019-12-17 DIAGNOSIS — Z0000 Encounter for general adult medical examination without abnormal findings: Secondary | ICD-10-CM | POA: Diagnosis not present

## 2019-12-17 DIAGNOSIS — M810 Age-related osteoporosis without current pathological fracture: Secondary | ICD-10-CM | POA: Diagnosis not present

## 2019-12-17 DIAGNOSIS — E782 Mixed hyperlipidemia: Secondary | ICD-10-CM | POA: Diagnosis not present

## 2019-12-17 DIAGNOSIS — K1379 Other lesions of oral mucosa: Secondary | ICD-10-CM | POA: Diagnosis not present

## 2019-12-17 DIAGNOSIS — W57XXXA Bitten or stung by nonvenomous insect and other nonvenomous arthropods, initial encounter: Secondary | ICD-10-CM | POA: Diagnosis not present

## 2019-12-17 DIAGNOSIS — E7849 Other hyperlipidemia: Secondary | ICD-10-CM | POA: Diagnosis not present

## 2019-12-17 DIAGNOSIS — C50919 Malignant neoplasm of unspecified site of unspecified female breast: Secondary | ICD-10-CM | POA: Diagnosis not present

## 2019-12-17 DIAGNOSIS — Z853 Personal history of malignant neoplasm of breast: Secondary | ICD-10-CM | POA: Diagnosis not present

## 2019-12-17 DIAGNOSIS — G720 Drug-induced myopathy: Secondary | ICD-10-CM | POA: Diagnosis not present

## 2019-12-21 DIAGNOSIS — E875 Hyperkalemia: Secondary | ICD-10-CM | POA: Diagnosis not present

## 2019-12-21 DIAGNOSIS — M810 Age-related osteoporosis without current pathological fracture: Secondary | ICD-10-CM | POA: Diagnosis not present

## 2019-12-21 DIAGNOSIS — F411 Generalized anxiety disorder: Secondary | ICD-10-CM | POA: Diagnosis not present

## 2019-12-21 DIAGNOSIS — E782 Mixed hyperlipidemia: Secondary | ICD-10-CM | POA: Diagnosis not present

## 2019-12-21 DIAGNOSIS — E669 Obesity, unspecified: Secondary | ICD-10-CM | POA: Diagnosis not present

## 2019-12-21 DIAGNOSIS — R7301 Impaired fasting glucose: Secondary | ICD-10-CM | POA: Diagnosis not present

## 2019-12-21 DIAGNOSIS — Z853 Personal history of malignant neoplasm of breast: Secondary | ICD-10-CM | POA: Diagnosis not present

## 2019-12-21 DIAGNOSIS — I10 Essential (primary) hypertension: Secondary | ICD-10-CM | POA: Diagnosis not present

## 2019-12-21 DIAGNOSIS — D72829 Elevated white blood cell count, unspecified: Secondary | ICD-10-CM | POA: Diagnosis not present

## 2019-12-21 DIAGNOSIS — Z23 Encounter for immunization: Secondary | ICD-10-CM | POA: Diagnosis not present

## 2019-12-21 DIAGNOSIS — R7303 Prediabetes: Secondary | ICD-10-CM | POA: Diagnosis not present

## 2019-12-21 DIAGNOSIS — F17210 Nicotine dependence, cigarettes, uncomplicated: Secondary | ICD-10-CM | POA: Diagnosis not present

## 2020-01-18 DIAGNOSIS — F419 Anxiety disorder, unspecified: Secondary | ICD-10-CM | POA: Diagnosis not present

## 2020-01-18 DIAGNOSIS — I10 Essential (primary) hypertension: Secondary | ICD-10-CM | POA: Diagnosis not present

## 2020-01-18 DIAGNOSIS — E782 Mixed hyperlipidemia: Secondary | ICD-10-CM | POA: Diagnosis not present

## 2020-02-16 DIAGNOSIS — F419 Anxiety disorder, unspecified: Secondary | ICD-10-CM | POA: Diagnosis not present

## 2020-02-16 DIAGNOSIS — I10 Essential (primary) hypertension: Secondary | ICD-10-CM | POA: Diagnosis not present

## 2020-02-16 DIAGNOSIS — E782 Mixed hyperlipidemia: Secondary | ICD-10-CM | POA: Diagnosis not present

## 2020-02-28 ENCOUNTER — Other Ambulatory Visit (HOSPITAL_COMMUNITY): Payer: Self-pay | Admitting: Hematology

## 2020-02-28 DIAGNOSIS — Z9889 Other specified postprocedural states: Secondary | ICD-10-CM

## 2020-03-17 DIAGNOSIS — I10 Essential (primary) hypertension: Secondary | ICD-10-CM | POA: Diagnosis not present

## 2020-03-17 DIAGNOSIS — F419 Anxiety disorder, unspecified: Secondary | ICD-10-CM | POA: Diagnosis not present

## 2020-03-17 DIAGNOSIS — E782 Mixed hyperlipidemia: Secondary | ICD-10-CM | POA: Diagnosis not present

## 2020-03-20 ENCOUNTER — Other Ambulatory Visit (HOSPITAL_COMMUNITY): Payer: Self-pay

## 2020-03-20 DIAGNOSIS — Z87891 Personal history of nicotine dependence: Secondary | ICD-10-CM

## 2020-03-20 DIAGNOSIS — Z122 Encounter for screening for malignant neoplasm of respiratory organs: Secondary | ICD-10-CM

## 2020-03-20 NOTE — Progress Notes (Signed)
Patient scheduled for follow-up LDCT on 04/04 at 1500. Patient aware

## 2020-03-26 DIAGNOSIS — R051 Acute cough: Secondary | ICD-10-CM | POA: Diagnosis not present

## 2020-03-26 DIAGNOSIS — J069 Acute upper respiratory infection, unspecified: Secondary | ICD-10-CM | POA: Diagnosis not present

## 2020-03-31 ENCOUNTER — Other Ambulatory Visit: Payer: Self-pay

## 2020-03-31 ENCOUNTER — Encounter (HOSPITAL_COMMUNITY)
Admission: RE | Admit: 2020-03-31 | Discharge: 2020-03-31 | Disposition: A | Discharge status: Home / Self Care | Payer: PPO | Source: Ambulatory Visit | Attending: Internal Medicine | Admitting: Internal Medicine

## 2020-03-31 ENCOUNTER — Encounter (HOSPITAL_COMMUNITY): Payer: Self-pay

## 2020-03-31 ENCOUNTER — Other Ambulatory Visit (HOSPITAL_COMMUNITY): Payer: Self-pay

## 2020-03-31 DIAGNOSIS — M810 Age-related osteoporosis without current pathological fracture: Secondary | ICD-10-CM | POA: Insufficient documentation

## 2020-03-31 DIAGNOSIS — D0512 Intraductal carcinoma in situ of left breast: Secondary | ICD-10-CM

## 2020-03-31 MED ORDER — DENOSUMAB 60 MG/ML ~~LOC~~ SOSY
60.0000 mg | PREFILLED_SYRINGE | Freq: Once | SUBCUTANEOUS | Status: AC
  Administered 2020-03-31: 60 mg via SUBCUTANEOUS

## 2020-04-01 ENCOUNTER — Other Ambulatory Visit (HOSPITAL_COMMUNITY): Payer: PPO

## 2020-04-01 ENCOUNTER — Encounter (HOSPITAL_COMMUNITY): Payer: PPO

## 2020-04-01 ENCOUNTER — Ambulatory Visit (HOSPITAL_COMMUNITY)
Admission: RE | Admit: 2020-04-01 | Discharge: 2020-04-01 | Disposition: A | Discharge status: Home / Self Care | Payer: PPO | Source: Ambulatory Visit | Attending: Nurse Practitioner | Admitting: Nurse Practitioner

## 2020-04-01 ENCOUNTER — Ambulatory Visit (HOSPITAL_COMMUNITY)
Admission: RE | Admit: 2020-04-01 | Discharge: 2020-04-01 | Disposition: A | Discharge status: Home / Self Care | Payer: PPO | Source: Ambulatory Visit | Attending: Hematology | Admitting: Hematology

## 2020-04-01 ENCOUNTER — Inpatient Hospital Stay (HOSPITAL_COMMUNITY): Payer: PPO | Attending: Hematology

## 2020-04-01 DIAGNOSIS — Z923 Personal history of irradiation: Secondary | ICD-10-CM | POA: Diagnosis not present

## 2020-04-01 DIAGNOSIS — D0512 Intraductal carcinoma in situ of left breast: Secondary | ICD-10-CM

## 2020-04-01 DIAGNOSIS — Z806 Family history of leukemia: Secondary | ICD-10-CM | POA: Diagnosis not present

## 2020-04-01 DIAGNOSIS — Z9889 Other specified postprocedural states: Secondary | ICD-10-CM

## 2020-04-01 DIAGNOSIS — R922 Inconclusive mammogram: Secondary | ICD-10-CM | POA: Diagnosis not present

## 2020-04-01 DIAGNOSIS — F17210 Nicotine dependence, cigarettes, uncomplicated: Secondary | ICD-10-CM | POA: Diagnosis not present

## 2020-04-01 DIAGNOSIS — Z801 Family history of malignant neoplasm of trachea, bronchus and lung: Secondary | ICD-10-CM | POA: Insufficient documentation

## 2020-04-01 DIAGNOSIS — Z8349 Family history of other endocrine, nutritional and metabolic diseases: Secondary | ICD-10-CM | POA: Insufficient documentation

## 2020-04-01 DIAGNOSIS — Z170 Estrogen receptor positive status [ER+]: Secondary | ICD-10-CM | POA: Diagnosis not present

## 2020-04-01 DIAGNOSIS — Z800 Family history of malignant neoplasm of digestive organs: Secondary | ICD-10-CM | POA: Insufficient documentation

## 2020-04-01 DIAGNOSIS — Z8249 Family history of ischemic heart disease and other diseases of the circulatory system: Secondary | ICD-10-CM | POA: Diagnosis not present

## 2020-04-01 DIAGNOSIS — Z79810 Long term (current) use of selective estrogen receptor modulators (SERMs): Secondary | ICD-10-CM | POA: Diagnosis not present

## 2020-04-01 DIAGNOSIS — I10 Essential (primary) hypertension: Secondary | ICD-10-CM | POA: Insufficient documentation

## 2020-04-01 DIAGNOSIS — M810 Age-related osteoporosis without current pathological fracture: Secondary | ICD-10-CM | POA: Diagnosis not present

## 2020-04-01 DIAGNOSIS — Z853 Personal history of malignant neoplasm of breast: Secondary | ICD-10-CM | POA: Diagnosis not present

## 2020-04-01 DIAGNOSIS — Z79899 Other long term (current) drug therapy: Secondary | ICD-10-CM | POA: Diagnosis not present

## 2020-04-01 LAB — CBC WITH DIFFERENTIAL/PLATELET
Abs Immature Granulocytes: 0.07 10*3/uL (ref 0.00–0.07)
Basophils Absolute: 0.1 10*3/uL (ref 0.0–0.1)
Basophils Relative: 1 %
Eosinophils Absolute: 0.1 10*3/uL (ref 0.0–0.5)
Eosinophils Relative: 1 %
HCT: 42.8 % (ref 36.0–46.0)
Hemoglobin: 13.4 g/dL (ref 12.0–15.0)
Immature Granulocytes: 1 %
Lymphocytes Relative: 38 %
Lymphs Abs: 3.8 10*3/uL (ref 0.7–4.0)
MCH: 28.7 pg (ref 26.0–34.0)
MCHC: 31.3 g/dL (ref 30.0–36.0)
MCV: 91.6 fL (ref 80.0–100.0)
Monocytes Absolute: 0.6 10*3/uL (ref 0.1–1.0)
Monocytes Relative: 6 %
Neutro Abs: 5.5 10*3/uL (ref 1.7–7.7)
Neutrophils Relative %: 53 %
Platelets: 276 10*3/uL (ref 150–400)
RBC: 4.67 MIL/uL (ref 3.87–5.11)
RDW: 13.3 % (ref 11.5–15.5)
WBC: 10.1 10*3/uL (ref 4.0–10.5)
nRBC: 0 % (ref 0.0–0.2)

## 2020-04-01 LAB — VITAMIN D 25 HYDROXY (VIT D DEFICIENCY, FRACTURES): Vit D, 25-Hydroxy: 57.13 ng/mL (ref 30–100)

## 2020-04-01 LAB — COMPREHENSIVE METABOLIC PANEL
ALT: 12 U/L (ref 0–44)
AST: 14 U/L — ABNORMAL LOW (ref 15–41)
Albumin: 3.5 g/dL (ref 3.5–5.0)
Alkaline Phosphatase: 55 U/L (ref 38–126)
Anion gap: 10 (ref 5–15)
BUN: 9 mg/dL (ref 8–23)
CO2: 27 mmol/L (ref 22–32)
Calcium: 9.3 mg/dL (ref 8.9–10.3)
Chloride: 104 mmol/L (ref 98–111)
Creatinine, Ser: 0.75 mg/dL (ref 0.44–1.00)
GFR, Estimated: >60 mL/min (ref >60)
Glucose, Bld: 118 mg/dL — ABNORMAL HIGH (ref 70–99)
Potassium: 4.8 mmol/L (ref 3.5–5.1)
Sodium: 141 mmol/L (ref 135–145)
Total Bilirubin: 0.4 mg/dL (ref 0.3–1.2)
Total Protein: 6.9 g/dL (ref 6.5–8.1)

## 2020-04-01 LAB — LACTATE DEHYDROGENASE: LDH: 151 U/L (ref 98–192)

## 2020-04-01 LAB — VITAMIN B12: Vitamin B-12: 430 pg/mL (ref 180–914)

## 2020-04-08 ENCOUNTER — Inpatient Hospital Stay (HOSPITAL_COMMUNITY): Payer: PPO | Admitting: Oncology

## 2020-04-08 ENCOUNTER — Other Ambulatory Visit: Payer: Self-pay

## 2020-04-08 VITALS — BP 141/63 | HR 80 | Temp 98.6°F | Resp 17 | Wt 190.0 lb

## 2020-04-08 DIAGNOSIS — F17210 Nicotine dependence, cigarettes, uncomplicated: Secondary | ICD-10-CM | POA: Diagnosis not present

## 2020-04-08 DIAGNOSIS — M810 Age-related osteoporosis without current pathological fracture: Secondary | ICD-10-CM

## 2020-04-08 DIAGNOSIS — Z716 Tobacco abuse counseling: Secondary | ICD-10-CM | POA: Diagnosis not present

## 2020-04-08 DIAGNOSIS — D0512 Intraductal carcinoma in situ of left breast: Secondary | ICD-10-CM | POA: Diagnosis not present

## 2020-04-08 MED ORDER — BUPROPION HCL ER (XL) 150 MG PO TB24: 150.0000 mg | ORAL_TABLET | Freq: Every day | ORAL | 1 refills | Status: DC

## 2020-04-08 MED ORDER — NICOTINE 21 MG/24HR TD PT24
21.0000 mg | MEDICATED_PATCH | Freq: Every day | TRANSDERMAL | 0 refills | Status: DC
Start: 2020-04-08 — End: 2021-04-29

## 2020-04-08 NOTE — Progress Notes (Signed)
[facility] Follow up:    [name], MD [place] [address]   DIAGNOSIS: Breast cancer   SUMMARY OF ONCOLOGIC HISTORY: Oncology History  DCIS (ductal carcinoma in situ)  04/12/2017 Initial Diagnosis   DCIS (ductal carcinoma in situ)   09/03/2017 Genetic Testing   Negative genetic testing on the multi-cancer panel.  The Multi-Gene Panel offered by [facility] includes sequencing and/or deletion duplication testing of the following 84 genes: AIP, ALK, APC, ATM, AXIN2,BAP1,  BARD1, BLM, BMPR1A, BRCA1, BRCA2, BRIP1, CASR, CDC73, CDH1, CDK4, CDKN1B, CDKN1C, CDKN2A (p14ARF), CDKN2A (p16INK4a), CEBPA, CHEK2, CTNNA1, DICER1, DIS3L2, EGFR (c.2369C>T, p.Thr790Met variant only), EPCAM (Deletion/duplication testing only), FH, FLCN, GATA2, GPC3, GREM1 (Promoter region deletion/duplication testing only), HOXB13 (c.251G>A, p.Gly84Glu), HRAS, KIT, MAX, MEN1, MET, MITF (c.952G>A, p.Glu318Lys variant only), MLH1, MSH2, MSH3, MSH6, MUTYH, NBN, NF1, NF2, NTHL1, PALB2, PDGFRA, PHOX2B, PMS2, POLD1, POLE, POT1, PRKAR1A, PTCH1, PTEN, RAD50, RAD51C, RAD51D, RB1, RECQL4, RET, RUNX1, SDHAF2, SDHA (sequence changes only), SDHB, SDHC, SDHD, SMAD4, SMARCA4, SMARCB1, SMARCE1, STK11, SUFU, TERC, TERT, TMEM127, TP53, TSC1, TSC2, VHL, WRN and WT1.  The report date is September 03, 2017.     CURRENT THERAPY: Surveillance  INTERVAL HISTORY: Melissa Weaver 70 y.o. female returns for for breast cancer.  She was last seen in clinic on 10/09/2019.  In the interim, she has done well.  Patient reports a decline in her energy level.  Endorses chronic joint pain mainly in her left hip for which she takes Tylenol with relief.  Has intermittent chronic cough secondary to smoking.  She continues to smoke 1 pack of cigarettes per day.  Endorses trying to stay active with walking daily.  Overall feels stable.  Denies any new lumps or bumps.  Denies any new bone pain. Had not noticed any recent bleeding  such as epistaxis, hematuria or hematochezia. Denies recent chest pain on exertion, shortness of breath on minimal exertion, pre-syncopal episodes, or palpitations. Denies any numbness or tingling in hands or feet. Denies any recent fevers, infections, or recent hospitalizations. Patient reports appetite at 100% and energy level at 100%. She is eating well maintain her weight at this time.  Patient Active Problem List   Diagnosis Date Noted  . Genetic testing 09/06/2017  . Family history of pancreatic cancer   . Family history of colon cancer   . Family history of melanoma   . 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 HLD (hyperlipidemia) 04/07/2017  . Anxiety state 04/07/2017  . Depression 04/07/2017  . HTN (hypertension) 04/07/2017    has No Known Allergies.  MEDICAL HISTORY: Past Medical History:  Diagnosis Date  . Anxiety   . Arthritis   . Depression   . Family history of colon cancer   . Family history of melanoma   . Family history of pancreatic cancer   . HLD (hyperlipidemia)   . HTN (hypertension)   . Seizures ([place])    had a seizure as child, unknown etiology, no meds though. No seizures since 10 years.    SURGICAL HISTORY: Past Surgical History:  Procedure Laterality Date  . PARTIAL MASTECTOMY WITH NEEDLE LOCALIZATION Left 05/11/2017   Procedure: PARTIAL MASTECTOMY WITH NEEDLE LOCALIZATION;  Surgeon: Virl Cagey, MD;  Location: [facility];  Service: General;  Laterality: Left;  . TUBAL LIGATION      SOCIAL HISTORY: Social History   Socioeconomic History  . Marital status: Married    Spouse name: Not on file  .  Number of children: Not on file  . Years of education: Not on file  . Highest education level: Not on file  Occupational History  . Not on file  Tobacco Use  . Smoking status: Current Every Day Smoker    Packs/day: 1.00    Years: 40.00    Pack years: 40.00    Types: Cigarettes  . Smokeless tobacco: Never Used  Vaping Use  . Vaping Use:  Never used  Substance and Sexual Activity  . Alcohol use: Never  . Drug use: Never  . Sexual activity: Yes    Birth control/protection: None  Other Topics Concern  . Not on file  Social History Narrative  . Not on file   Social Determinants of Health   Financial Resource Strain: Not on file  Food Insecurity: Not on file  Transportation Needs: Not on file  Physical Activity: Not on file  Stress: Not on file  Social Connections: Not on file  Intimate Partner Violence: Not At Risk  . Fear of Current or Ex-Partner: No  . Emotionally Abused: No  . Physically Abused: No  . Sexually Abused: No    FAMILY HISTORY: Family History  Problem Relation Age of Onset  . Pancreatic cancer Mother 39       d. 11  . Atrial fibrillation Father   . COPD Father   . Hypertension Father   . High Cholesterol Father   . Pancreatic cancer Maternal Aunt 75       d. 61  . Pancreatic cancer Maternal Uncle        dx and died in his 74s  . Lung cancer Maternal Grandfather 92       d. 51  . Heart attack Maternal Grandfather 92       d. 13  . Lung cancer Maternal Aunt   . Leukemia Maternal Uncle        dx in his 67s-40s  . Heart attack Maternal Uncle   . Melanoma Other        MGMs sister  . Colon cancer Other        MGMs mother    Review of Systems  Constitutional: Positive for fatigue. Negative for appetite change, fever and unexpected weight change.  HENT:   Negative for nosebleeds, sore throat and trouble swallowing.   Eyes: Negative.   Respiratory: Positive for cough. Negative for shortness of breath and wheezing.   Cardiovascular: Negative.  Negative for chest pain and leg swelling.  Gastrointestinal: Negative for abdominal pain, blood in stool, constipation, diarrhea, nausea and vomiting.  Endocrine: Negative.   Genitourinary: Negative.  Negative for bladder incontinence, hematuria and nocturia.   Musculoskeletal: Negative.  Negative for back pain and flank pain.  Skin: Negative.    Neurological: Negative.  Negative for dizziness, headaches, light-headedness and numbness.  Hematological: Negative.   Psychiatric/Behavioral: Negative.  Negative for confusion. The patient is not nervous/anxious.   All other systems reviewed and are negative.    Physical Exam Constitutional:      Appearance: Normal appearance.  HENT:     Head: Normocephalic and atraumatic.  Eyes:     Pupils: Pupils are equal, round, and reactive to light.  Cardiovascular:     Rate and Rhythm: Normal rate and regular rhythm.     Heart sounds: Normal heart sounds. No murmur heard.   Pulmonary:     Effort: Pulmonary effort is normal.     Breath sounds: Normal breath sounds. No wheezing.  Abdominal:  General: Bowel sounds are normal. There is no distension.     Palpations: Abdomen is soft.     Tenderness: There is no abdominal tenderness.  Musculoskeletal:        General: Normal range of motion.     Cervical back: Normal range of motion.  Skin:    General: Skin is warm and dry.     Findings: No rash.  Neurological:     Mental Status: She is alert and oriented to person, place, and time.  Psychiatric:        Judgment: Judgment normal.      LABORATORY DATA:  CBC    Component Value Date/Time   WBC 10.1 04/01/2020 0942   RBC 4.67 04/01/2020 0942   HGB 13.4 04/01/2020 0942   HCT 42.8 04/01/2020 0942   PLT 276 04/01/2020 0942   MCV 91.6 04/01/2020 0942   MCH 28.7 04/01/2020 0942   MCHC 31.3 04/01/2020 0942   RDW 13.3 04/01/2020 0942   LYMPHSABS 3.8 04/01/2020 0942   MONOABS 0.6 04/01/2020 0942   EOSABS 0.1 04/01/2020 0942   BASOSABS 0.1 04/01/2020 0942    CMP     Component Value Date/Time   NA 141 04/01/2020 0942   K 4.8 04/01/2020 0942   CL 104 04/01/2020 0942   CO2 27 04/01/2020 0942   GLUCOSE 118 (H) 04/01/2020 0942   BUN 9 04/01/2020 0942   CREATININE 0.75 04/01/2020 0942   CALCIUM 9.3 04/01/2020 0942   PROT 6.9 04/01/2020 0942   ALBUMIN 3.5 04/01/2020 0942    AST 14 (L) 04/01/2020 0942   ALT 12 04/01/2020 0942   ALKPHOS 55 04/01/2020 0942   BILITOT 0.4 04/01/2020 0942   GFRNONAA >60 04/01/2020 0942   GFRAA >60 10/02/2019 1314    All questions were answered to patient's stated satisfaction. Encouraged patient to call with any new concerns or questions before his next visit to the cancer center and we can certain see him sooner, if needed.     ASSESSMENT and THERAPY PLAN:   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 -Biopsy of the left breast on 03/31/2017, DCIS, ER/PR positive -Left lumpectomy on 05/11/2017, intermediate grade DCIS, margins negative. - Risk reduction therapy was recommended with 5 years of tamoxifen/aromatase inhibitor.  Because of her osteoporosis tamoxifen was chosen. - Tamoxifen was started 07/2017 after radiation therapy was completed.  She is tolerating tamoxifen well. -Mammogram from 04/01/2020 was reported as BI-RADS Category 2 benign.  Recommend screening mammogram in 1 year. -Labs done on 04/01/2020 show a white count of 10.1, hemoglobin 13.4, platelets 276 and creatinine of 0.75.  2.  Osteoporosis: -Her last DEXA scan was on 03/27/2019 which showed osteoporosis with a T score of -3.3 slightly worse from last DEXA scan. -She is receiving Prolia injections through her PCP. -Her last Prolia injection was on 03/31/2020. -Repeat DEXA scan in the next 1 to 2 weeks given worsening T score a year ago.   3.  Smoking history: -Patient reports she has smoked 1 pack/day for greater than 30 years. -Had initial low-dose CT scan on 04/16/2019 which was lung RADS category 2 benign appearance or behavior.  Continue annual screening. -She is scheduled for repeat CT scan on 04/21/2020. -She is interested in quitting smoking. -Recommend Wellbutrin 150 mg daily and 21 mg/day nicotine patch.   -Goal is to reduce cigarettes smoked.   -We will set quit date at next visit.    Disposition: -  Recommend follow-up with virtual visit in 1 month to assess tolerance of Wellbutrin and nicotine patch.  -RTC in 6 months with repeat labs and MD assessment.  -Proceed with low-dose CT scan on 04/21/2020. -Schedule bone density scan in the next 1 to 2 weeks.  No problem-specific Assessment & Plan notes found for this encounter.   Orders Placed This Encounter  Procedures  . DG Bone Density    Standing Status:   Future    Standing Expiration Date:   04/08/2021    Order Specific Question:   Reason for Exam (SYMPTOM  OR DIAGNOSIS REQUIRED)    Answer:   annual for osteoporosis    Order Specific Question:   Preferred imaging location?    Answer:   [facility]    All questions were answered. The patient knows to call the clinic with any problems, questions or concerns. We can certainly see the patient much sooner if necessary. This note was electronically signed.  Greater than 50% was spent in counseling and coordination of care with this patient including but not limited to discussion of the relevant topics above (See A&P) including, but not limited to diagnosis and management of acute and chronic medical conditions.    Jacquelin Hawking, NP 04/08/2020

## 2020-04-16 DIAGNOSIS — G720 Drug-induced myopathy: Secondary | ICD-10-CM | POA: Diagnosis not present

## 2020-04-16 DIAGNOSIS — I10 Essential (primary) hypertension: Secondary | ICD-10-CM | POA: Diagnosis not present

## 2020-04-16 DIAGNOSIS — F419 Anxiety disorder, unspecified: Secondary | ICD-10-CM | POA: Diagnosis not present

## 2020-04-16 DIAGNOSIS — E7849 Other hyperlipidemia: Secondary | ICD-10-CM | POA: Diagnosis not present

## 2020-04-21 ENCOUNTER — Ambulatory Visit (HOSPITAL_COMMUNITY)
Admission: RE | Admit: 2020-04-21 | Discharge: 2020-04-21 | Disposition: A | Discharge status: Home / Self Care | Payer: PPO | Source: Ambulatory Visit | Attending: Oncology | Admitting: Oncology

## 2020-04-21 ENCOUNTER — Other Ambulatory Visit: Payer: Self-pay

## 2020-04-21 DIAGNOSIS — Z87891 Personal history of nicotine dependence: Secondary | ICD-10-CM | POA: Diagnosis not present

## 2020-04-21 DIAGNOSIS — M810 Age-related osteoporosis without current pathological fracture: Secondary | ICD-10-CM

## 2020-04-21 DIAGNOSIS — M85852 Other specified disorders of bone density and structure, left thigh: Secondary | ICD-10-CM | POA: Diagnosis not present

## 2020-04-21 DIAGNOSIS — F17210 Nicotine dependence, cigarettes, uncomplicated: Secondary | ICD-10-CM | POA: Diagnosis not present

## 2020-04-21 DIAGNOSIS — Z122 Encounter for screening for malignant neoplasm of respiratory organs: Secondary | ICD-10-CM | POA: Insufficient documentation

## 2020-04-21 DIAGNOSIS — Z780 Asymptomatic menopausal state: Secondary | ICD-10-CM | POA: Diagnosis not present

## 2020-04-23 ENCOUNTER — Encounter (HOSPITAL_COMMUNITY): Payer: Self-pay

## 2020-04-23 NOTE — Progress Notes (Signed)
Patient notified of LDCT Lung Cancer Screening Results via mail with the recommendation to follow-up in 12 months. Patient's referring provider has been sent a copy of results. Results are as follows:  IMPRESSION: 1. Lung-RADS 2, benign appearance or behavior. Continue annual screening with low-dose chest CT without contrast in 12 months. 2. Aortic Atherosclerosis (ICD10-I70.0) and Emphysema (ICD10-J43.9). 3. Coronary artery calcifications.

## 2020-05-09 ENCOUNTER — Inpatient Hospital Stay: Payer: PPO | Admitting: Oncology

## 2020-05-18 DIAGNOSIS — I10 Essential (primary) hypertension: Secondary | ICD-10-CM | POA: Diagnosis not present

## 2020-05-18 DIAGNOSIS — F419 Anxiety disorder, unspecified: Secondary | ICD-10-CM | POA: Diagnosis not present

## 2020-07-01 DIAGNOSIS — I10 Essential (primary) hypertension: Secondary | ICD-10-CM | POA: Diagnosis not present

## 2020-07-04 DIAGNOSIS — D649 Anemia, unspecified: Secondary | ICD-10-CM | POA: Diagnosis not present

## 2020-07-04 DIAGNOSIS — E875 Hyperkalemia: Secondary | ICD-10-CM | POA: Diagnosis not present

## 2020-07-04 DIAGNOSIS — E785 Hyperlipidemia, unspecified: Secondary | ICD-10-CM | POA: Diagnosis not present

## 2020-07-04 DIAGNOSIS — Z0001 Encounter for general adult medical examination with abnormal findings: Secondary | ICD-10-CM | POA: Diagnosis not present

## 2020-07-04 DIAGNOSIS — I10 Essential (primary) hypertension: Secondary | ICD-10-CM | POA: Diagnosis not present

## 2020-07-17 DIAGNOSIS — F419 Anxiety disorder, unspecified: Secondary | ICD-10-CM | POA: Diagnosis not present

## 2020-07-17 DIAGNOSIS — I10 Essential (primary) hypertension: Secondary | ICD-10-CM | POA: Diagnosis not present

## 2020-09-01 ENCOUNTER — Other Ambulatory Visit: Payer: Self-pay | Admitting: Family

## 2020-09-01 DIAGNOSIS — F419 Anxiety disorder, unspecified: Secondary | ICD-10-CM

## 2020-09-17 DIAGNOSIS — I10 Essential (primary) hypertension: Secondary | ICD-10-CM | POA: Diagnosis not present

## 2020-09-17 DIAGNOSIS — F419 Anxiety disorder, unspecified: Secondary | ICD-10-CM | POA: Diagnosis not present

## 2020-10-01 ENCOUNTER — Encounter (HOSPITAL_COMMUNITY)
Admission: RE | Admit: 2020-10-01 | Discharge: 2020-10-01 | Disposition: A | Discharge status: Home / Self Care | Payer: PPO | Source: Ambulatory Visit | Attending: Internal Medicine | Admitting: Internal Medicine

## 2020-10-01 ENCOUNTER — Other Ambulatory Visit: Payer: Self-pay

## 2020-10-01 DIAGNOSIS — M810 Age-related osteoporosis without current pathological fracture: Secondary | ICD-10-CM | POA: Insufficient documentation

## 2020-10-01 MED ORDER — DENOSUMAB 60 MG/ML ~~LOC~~ SOSY
60.0000 mg | PREFILLED_SYRINGE | Freq: Once | SUBCUTANEOUS | Status: AC
  Administered 2020-10-01: 60 mg via SUBCUTANEOUS
  Filled 2020-10-01: qty 1

## 2020-10-07 DIAGNOSIS — D485 Neoplasm of uncertain behavior of skin: Secondary | ICD-10-CM | POA: Diagnosis not present

## 2020-10-07 DIAGNOSIS — D2271 Melanocytic nevi of right lower limb, including hip: Secondary | ICD-10-CM | POA: Diagnosis not present

## 2020-10-16 ENCOUNTER — Other Ambulatory Visit (HOSPITAL_COMMUNITY): Payer: Self-pay

## 2020-10-16 DIAGNOSIS — D0512 Intraductal carcinoma in situ of left breast: Secondary | ICD-10-CM

## 2020-10-17 DIAGNOSIS — F419 Anxiety disorder, unspecified: Secondary | ICD-10-CM | POA: Diagnosis not present

## 2020-10-17 DIAGNOSIS — I10 Essential (primary) hypertension: Secondary | ICD-10-CM | POA: Diagnosis not present

## 2020-10-20 ENCOUNTER — Inpatient Hospital Stay (HOSPITAL_COMMUNITY): Payer: PPO | Attending: Hematology

## 2020-10-20 ENCOUNTER — Other Ambulatory Visit: Payer: Self-pay

## 2020-10-20 DIAGNOSIS — Z23 Encounter for immunization: Secondary | ICD-10-CM | POA: Diagnosis not present

## 2020-10-20 DIAGNOSIS — D0512 Intraductal carcinoma in situ of left breast: Secondary | ICD-10-CM | POA: Diagnosis not present

## 2020-10-20 LAB — COMPREHENSIVE METABOLIC PANEL
ALT: 15 U/L (ref 0–44)
AST: 15 U/L (ref 15–41)
Albumin: 3.8 g/dL (ref 3.5–5.0)
Alkaline Phosphatase: 63 U/L (ref 38–126)
Anion gap: 7 (ref 5–15)
BUN: 12 mg/dL (ref 8–23)
CO2: 27 mmol/L (ref 22–32)
Calcium: 9.4 mg/dL (ref 8.9–10.3)
Chloride: 103 mmol/L (ref 98–111)
Creatinine, Ser: 0.77 mg/dL (ref 0.44–1.00)
GFR, Estimated: >60 mL/min (ref >60)
Glucose, Bld: 105 mg/dL — ABNORMAL HIGH (ref 70–99)
Potassium: 4.5 mmol/L (ref 3.5–5.1)
Sodium: 137 mmol/L (ref 135–145)
Total Bilirubin: 0.6 mg/dL (ref 0.3–1.2)
Total Protein: 7.5 g/dL (ref 6.5–8.1)

## 2020-10-20 LAB — CBC WITH DIFFERENTIAL/PLATELET
Abs Immature Granulocytes: 0.06 10*3/uL (ref 0.00–0.07)
Basophils Absolute: 0.1 10*3/uL (ref 0.0–0.1)
Basophils Relative: 1 %
Eosinophils Absolute: 0.1 10*3/uL (ref 0.0–0.5)
Eosinophils Relative: 1 %
HCT: 44.5 % (ref 36.0–46.0)
Hemoglobin: 14.1 g/dL (ref 12.0–15.0)
Immature Granulocytes: 1 %
Lymphocytes Relative: 39 %
Lymphs Abs: 4.4 10*3/uL — ABNORMAL HIGH (ref 0.7–4.0)
MCH: 29.1 pg (ref 26.0–34.0)
MCHC: 31.7 g/dL (ref 30.0–36.0)
MCV: 91.9 fL (ref 80.0–100.0)
Monocytes Absolute: 0.8 10*3/uL (ref 0.1–1.0)
Monocytes Relative: 7 %
Neutro Abs: 5.8 10*3/uL (ref 1.7–7.7)
Neutrophils Relative %: 51 %
Platelets: 277 10*3/uL (ref 150–400)
RBC: 4.84 MIL/uL (ref 3.87–5.11)
RDW: 13.5 % (ref 11.5–15.5)
WBC: 11.3 10*3/uL — ABNORMAL HIGH (ref 4.0–10.5)
nRBC: 0 % (ref 0.0–0.2)

## 2020-10-20 LAB — LACTATE DEHYDROGENASE: LDH: 166 U/L (ref 98–192)

## 2020-10-20 LAB — VITAMIN D 25 HYDROXY (VIT D DEFICIENCY, FRACTURES): Vit D, 25-Hydroxy: 45.32 ng/mL (ref 30–100)

## 2020-10-20 LAB — VITAMIN B12: Vitamin B-12: 955 pg/mL — ABNORMAL HIGH (ref 180–914)

## 2020-10-25 NOTE — Progress Notes (Signed)
Melissa Weaver [address], [place] [address]   Patient Care Team: [name], MD as PCP - General (Internal Medicine)  SUMMARY OF ONCOLOGIC HISTORY: Oncology History  DCIS (ductal carcinoma in situ)  04/12/2017 Initial Diagnosis   DCIS (ductal carcinoma in situ)   09/03/2017 Genetic Testing   Negative genetic testing on the multi-cancer panel.  The Multi-Gene Panel offered by [facility] includes sequencing and/or deletion duplication testing of the following 84 genes: AIP, ALK, APC, ATM, AXIN2,BAP1,  BARD1, BLM, BMPR1A, BRCA1, BRCA2, BRIP1, CASR, CDC73, CDH1, CDK4, CDKN1B, CDKN1C, CDKN2A (p14ARF), CDKN2A (p16INK4a), CEBPA, CHEK2, CTNNA1, DICER1, DIS3L2, EGFR (c.2369C>T, p.Thr790Met variant only), EPCAM (Deletion/duplication testing only), FH, FLCN, GATA2, GPC3, GREM1 (Promoter region deletion/duplication testing only), HOXB13 (c.251G>A, p.Gly84Glu), HRAS, KIT, MAX, MEN1, MET, MITF (c.952G>A, p.Glu318Lys variant only), MLH1, MSH2, MSH3, MSH6, MUTYH, NBN, NF1, NF2, NTHL1, PALB2, PDGFRA, PHOX2B, PMS2, POLD1, POLE, POT1, PRKAR1A, PTCH1, PTEN, RAD50, RAD51C, RAD51D, RB1, RECQL4, RET, RUNX1, SDHAF2, SDHA (sequence changes only), SDHB, SDHC, SDHD, SMAD4, SMARCA4, SMARCB1, SMARCE1, STK11, SUFU, TERC, TERT, TMEM127, TP53, TSC1, TSC2, VHL, WRN and WT1.  The report date is September 03, 2017.     CHIEF COMPLIANT: Follow-up of breast cancer   INTERVAL HISTORY: Melissa Weaver is a 70 y.o. female here today for follow up of her breast cancer. Her last visit was on 04/08/20 with Faythe Casa, NP.   Today she reports feeling well. She denies hot flashes, aches, dental issues, vaginal bleeding or spotting, and pains. She has been taking calcium and vitamin D twice daily.   REVIEW OF SYSTEMS:   Review of Systems  Constitutional:  Negative for appetite change (65%) and fatigue. Fever: 65%. Endocrine: Negative for hot flashes.  Genitourinary:  Negative for vaginal bleeding.    Musculoskeletal:  Negative for arthralgias and myalgias.  Psychiatric/Behavioral:  Positive for depression and sleep disturbance. The patient is nervous/anxious.   All other systems reviewed and are negative.  I have reviewed the past medical history, past surgical history, social history and family history with the patient and they are unchanged from previous note.   ALLERGIES:   has No Known Allergies.   MEDICATIONS:  Current Outpatient Medications  Medication Sig Dispense Refill   acetaminophen (TYLENOL) 650 MG CR tablet Take 1,300 mg by mouth daily as needed for pain.     ALPRAZolam (XANAX) 0.25 MG tablet Take 0.25 mg by mouth 3 (three) times daily as needed for anxiety.      atorvastatin (LIPITOR) 10 MG tablet Take 10 mg by mouth daily.      benzonatate (TESSALON) 100 MG capsule Take 100 mg by mouth 3 (three) times daily as needed.     buPROPion (WELLBUTRIN XL) 150 MG 24 hr tablet Take 1 tablet (150 mg total) by mouth daily. 30 tablet 1   cholecalciferol (VITAMIN D) 1000 units tablet Take 5,000 Units by mouth daily.     cyanocobalamin (,VITAMIN B-12,) 1000 MCG/ML injection Inject 1,000 mcg into the muscle once a week.     denosumab (PROLIA) 60 MG/ML SOLN injection Inject 60 mg into the skin every 6 (six) months. Administer in upper arm, thigh, or abdomen     escitalopram (LEXAPRO) 10 MG tablet Take 10 mg by mouth daily.      Magnesium 400 MG TABS Take 200 mg by mouth daily.      MODERNA COVID-19 VACCINE 100 MCG/0.5ML injection      nicotine (NICODERM CQ - DOSED IN MG/24 HOURS)  21 mg/24hr patch Place 1 patch (21 mg total) onto the skin daily. 28 patch 0   nystatin (MYCOSTATIN) 100000 UNIT/ML suspension Take by mouth.     promethazine-dextromethorphan (PROMETHAZINE-DM) 6.25-15 MG/5ML syrup TAKE 5 MLS BY MOUTH EVERYI6 HOURS AS NEEDED FOR COUGH.     tamoxifen (NOLVADEX) 20 MG tablet Take 1 tablet (20 mg total) by mouth daily. 90 tablet 3   vitamin C (ASCORBIC ACID) 500 MG tablet Take  500 mg by mouth 2 (two) times daily.     No current facility-administered medications for this visit.     PHYSICAL EXAMINATION: Performance status (ECOG): 1 - Symptomatic but completely ambulatory  There were no vitals filed for this visit. Wt Readings from Last 3 Encounters:  04/08/20 190 lb (86.2 kg)  04/03/19 190 lb 9.6 oz (86.5 kg)  09/26/18 185 lb (83.9 kg)   Physical Exam Vitals reviewed.  Constitutional:      Appearance: Normal appearance.  Cardiovascular:     Rate and Rhythm: Normal rate and regular rhythm.     Pulses: Normal pulses.     Heart sounds: Normal heart sounds.  Pulmonary:     Effort: Pulmonary effort is normal.     Breath sounds: Normal breath sounds.  Chest:  Breasts:    Right: Normal. No mass.     Left: Swelling (lymphodema in lower quadrant) present. No mass. Skin change: lumpectomy scar UOQ normal. Neurological:     General: No focal deficit present.     Mental Status: She is alert and oriented to person, place, and time.  Psychiatric:        Mood and Affect: Mood normal.        Behavior: Behavior normal.    Breast Exam Chaperone: Thana Ates     LABORATORY DATA:  I have reviewed the data as listed CMP Latest Ref Rng & Units 10/20/2020 04/01/2020 10/02/2019  Glucose 70 - 99 mg/dL 105(H) 118(H) 104(H)  BUN 8 - 23 mg/dL 12 9 9   Creatinine 0.44 - 1.00 mg/dL 0.77 0.75 0.75  Sodium 135 - 145 mmol/L 137 141 139  Potassium 3.5 - 5.1 mmol/L 4.5 4.8 4.2  Chloride 98 - 111 mmol/L 103 104 104  CO2 22 - 32 mmol/L 27 27 26   Calcium 8.9 - 10.3 mg/dL 9.4 9.3 8.8(L)  Total Protein 6.5 - 8.1 g/dL 7.5 6.9 6.8  Total Bilirubin 0.3 - 1.2 mg/dL 0.6 0.4 0.6  Alkaline Phos 38 - 126 U/L 63 55 53  AST 15 - 41 U/L 15 14(L) 12(L)  ALT 0 - 44 U/L 15 12 13    No results found for: TYO060 Lab Results  Component Value Date   WBC 11.3 (H) 10/20/2020   HGB 14.1 10/20/2020   HCT 44.5 10/20/2020   MCV 91.9 10/20/2020   PLT 277 10/20/2020   NEUTROABS 5.8 10/20/2020     ASSESSMENT:  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 -Biopsy of the left breast on 03/31/2017, DCIS, ER/PR positive -Left lumpectomy on 05/11/2017, intermediate grade DCIS, margins negative - She completed radiation therapy. - Tamoxifen was started around May 2019.   2.  Osteoporosis: Her last DEXA scan on 02/24/2017 shows osteoporosis with a T score of -3.2.  She is receiving Prolia through her PCP.   PLAN:  Left breast DCIS: - Examination today shows left breast upper outer quadrant lumpectomy scar is within normal limits with mild lymphedema in the lower part of the left  breast.  Right breast has no palpable mass.  No palpable adenopathy. - She is tolerating tamoxifen very well. - Reviewed mammogram on 04/01/2020, BI-RADS Category 2. - Reviewed labs from 10/20/2020 which showed normal CBC.  Vitamin D was normal.  CMP was normal. - RTC 6 months for follow-up.  Osteoporosis: - DEXA scan on 04/21/2020 with T score -2.7.  Previous T score was -3.3. - Continue Prolia every 6 months.  Continue calcium and vitamin D supplements.  3.  Smoking history: - CT low-dose lung cancer screening protocol on 04/21/2020, lung RADS 2. - We will schedule another scan in April next year.  Breast Cancer therapy associated bone loss: I have recommended calcium, Vitamin D and weight bearing exercises.  Orders placed this encounter:  No orders of the defined types were placed in this encounter.   The patient has a good understanding of the overall plan. She agrees with it. She will call with any problems that may develop before the next visit here.  Derek Jack, MD [place] [phone]   I, Thana Ates, am acting as a scribe for Dr. Derek Jack.  I, Derek Jack MD, have reviewed the above documentation for accuracy and completeness, and I agree with the above.

## 2020-10-27 ENCOUNTER — Inpatient Hospital Stay (HOSPITAL_BASED_OUTPATIENT_CLINIC_OR_DEPARTMENT_OTHER): Payer: PPO | Admitting: Hematology

## 2020-10-27 ENCOUNTER — Other Ambulatory Visit: Payer: Self-pay

## 2020-10-27 VITALS — BP 124/77 | HR 82 | Temp 96.9°F | Resp 18 | Wt 188.4 lb

## 2020-10-27 DIAGNOSIS — M810 Age-related osteoporosis without current pathological fracture: Secondary | ICD-10-CM

## 2020-10-27 DIAGNOSIS — Z122 Encounter for screening for malignant neoplasm of respiratory organs: Secondary | ICD-10-CM | POA: Diagnosis not present

## 2020-10-27 DIAGNOSIS — D0512 Intraductal carcinoma in situ of left breast: Secondary | ICD-10-CM | POA: Diagnosis not present

## 2020-10-27 MED ORDER — INFLUENZA VAC A&B SA ADJ QUAD 0.5 ML IM PRSY
0.5000 mL | PREFILLED_SYRINGE | Freq: Once | INTRAMUSCULAR | Status: AC
  Administered 2020-10-27: 0.5 mL via INTRAMUSCULAR
  Filled 2020-10-27: qty 0.5

## 2020-10-27 NOTE — Progress Notes (Signed)
Patient come in for office visit. Flu vaccine ordered. See Mar for administration instruction. Patient remained stable during injection. Patient was discharged from clinic ambulatory and in stable condition.

## 2020-11-17 DIAGNOSIS — I10 Essential (primary) hypertension: Secondary | ICD-10-CM | POA: Diagnosis not present

## 2020-11-17 DIAGNOSIS — F419 Anxiety disorder, unspecified: Secondary | ICD-10-CM | POA: Diagnosis not present

## 2020-12-17 DIAGNOSIS — I10 Essential (primary) hypertension: Secondary | ICD-10-CM | POA: Diagnosis not present

## 2020-12-17 DIAGNOSIS — F419 Anxiety disorder, unspecified: Secondary | ICD-10-CM | POA: Diagnosis not present

## 2021-01-02 DIAGNOSIS — R7303 Prediabetes: Secondary | ICD-10-CM | POA: Diagnosis not present

## 2021-01-02 DIAGNOSIS — R7301 Impaired fasting glucose: Secondary | ICD-10-CM | POA: Diagnosis not present

## 2021-01-02 DIAGNOSIS — I10 Essential (primary) hypertension: Secondary | ICD-10-CM | POA: Diagnosis not present

## 2021-01-02 DIAGNOSIS — D649 Anemia, unspecified: Secondary | ICD-10-CM | POA: Diagnosis not present

## 2021-01-05 ENCOUNTER — Other Ambulatory Visit (HOSPITAL_COMMUNITY): Payer: Self-pay | Admitting: Family Medicine

## 2021-01-05 ENCOUNTER — Other Ambulatory Visit: Payer: Self-pay | Admitting: Family Medicine

## 2021-01-05 DIAGNOSIS — E785 Hyperlipidemia, unspecified: Secondary | ICD-10-CM | POA: Diagnosis not present

## 2021-01-05 DIAGNOSIS — E041 Nontoxic single thyroid nodule: Secondary | ICD-10-CM

## 2021-01-05 DIAGNOSIS — I10 Essential (primary) hypertension: Secondary | ICD-10-CM | POA: Diagnosis not present

## 2021-01-05 DIAGNOSIS — R7303 Prediabetes: Secondary | ICD-10-CM | POA: Diagnosis not present

## 2021-01-05 DIAGNOSIS — F17200 Nicotine dependence, unspecified, uncomplicated: Secondary | ICD-10-CM | POA: Diagnosis not present

## 2021-01-05 DIAGNOSIS — D649 Anemia, unspecified: Secondary | ICD-10-CM | POA: Diagnosis not present

## 2021-01-05 DIAGNOSIS — Z716 Tobacco abuse counseling: Secondary | ICD-10-CM | POA: Diagnosis not present

## 2021-01-05 DIAGNOSIS — Z0001 Encounter for general adult medical examination with abnormal findings: Secondary | ICD-10-CM | POA: Diagnosis not present

## 2021-01-13 ENCOUNTER — Ambulatory Visit (HOSPITAL_COMMUNITY)
Admission: RE | Admit: 2021-01-13 | Discharge: 2021-01-13 | Disposition: A | Discharge status: Home / Self Care | Payer: PPO | Source: Ambulatory Visit | Attending: Family Medicine | Admitting: Family Medicine

## 2021-01-13 ENCOUNTER — Other Ambulatory Visit: Payer: Self-pay

## 2021-01-13 DIAGNOSIS — E041 Nontoxic single thyroid nodule: Secondary | ICD-10-CM | POA: Diagnosis not present

## 2021-01-16 DIAGNOSIS — I10 Essential (primary) hypertension: Secondary | ICD-10-CM | POA: Diagnosis not present

## 2021-01-16 DIAGNOSIS — F419 Anxiety disorder, unspecified: Secondary | ICD-10-CM | POA: Diagnosis not present

## 2021-02-17 DIAGNOSIS — I10 Essential (primary) hypertension: Secondary | ICD-10-CM | POA: Diagnosis not present

## 2021-02-17 DIAGNOSIS — F419 Anxiety disorder, unspecified: Secondary | ICD-10-CM | POA: Diagnosis not present

## 2021-03-17 DIAGNOSIS — I10 Essential (primary) hypertension: Secondary | ICD-10-CM | POA: Diagnosis not present

## 2021-03-17 DIAGNOSIS — F419 Anxiety disorder, unspecified: Secondary | ICD-10-CM | POA: Diagnosis not present

## 2021-03-18 ENCOUNTER — Other Ambulatory Visit (HOSPITAL_COMMUNITY): Payer: Self-pay | Admitting: *Deleted

## 2021-03-18 DIAGNOSIS — Z122 Encounter for screening for malignant neoplasm of respiratory organs: Secondary | ICD-10-CM

## 2021-03-31 ENCOUNTER — Encounter (HOSPITAL_COMMUNITY)
Admission: RE | Admit: 2021-03-31 | Discharge: 2021-03-31 | Disposition: A | Discharge status: Home / Self Care | Payer: PPO | Source: Ambulatory Visit | Attending: Internal Medicine | Admitting: Internal Medicine

## 2021-03-31 DIAGNOSIS — M810 Age-related osteoporosis without current pathological fracture: Secondary | ICD-10-CM | POA: Insufficient documentation

## 2021-03-31 MED ORDER — DENOSUMAB 60 MG/ML ~~LOC~~ SOSY
60.0000 mg | PREFILLED_SYRINGE | Freq: Once | SUBCUTANEOUS | Status: AC
  Administered 2021-03-31: 60 mg via SUBCUTANEOUS
  Filled 2021-03-31: qty 1

## 2021-04-03 ENCOUNTER — Other Ambulatory Visit: Payer: Self-pay

## 2021-04-03 ENCOUNTER — Ambulatory Visit (HOSPITAL_COMMUNITY)
Admission: RE | Admit: 2021-04-03 | Discharge: 2021-04-03 | Disposition: A | Discharge status: Home / Self Care | Payer: PPO | Source: Ambulatory Visit | Attending: Hematology | Admitting: Hematology

## 2021-04-03 DIAGNOSIS — D0512 Intraductal carcinoma in situ of left breast: Secondary | ICD-10-CM

## 2021-04-03 DIAGNOSIS — Z853 Personal history of malignant neoplasm of breast: Secondary | ICD-10-CM | POA: Diagnosis not present

## 2021-04-03 DIAGNOSIS — Z122 Encounter for screening for malignant neoplasm of respiratory organs: Secondary | ICD-10-CM

## 2021-04-03 DIAGNOSIS — Z1231 Encounter for screening mammogram for malignant neoplasm of breast: Secondary | ICD-10-CM | POA: Insufficient documentation

## 2021-04-17 DIAGNOSIS — I10 Essential (primary) hypertension: Secondary | ICD-10-CM | POA: Diagnosis not present

## 2021-04-17 DIAGNOSIS — F419 Anxiety disorder, unspecified: Secondary | ICD-10-CM | POA: Diagnosis not present

## 2021-04-22 ENCOUNTER — Ambulatory Visit (HOSPITAL_COMMUNITY)
Admission: RE | Admit: 2021-04-22 | Discharge: 2021-04-22 | Disposition: A | Discharge status: Home / Self Care | Payer: PPO | Source: Ambulatory Visit | Attending: Hematology | Admitting: Hematology

## 2021-04-22 ENCOUNTER — Inpatient Hospital Stay (HOSPITAL_COMMUNITY): Payer: PPO | Attending: Hematology

## 2021-04-22 ENCOUNTER — Other Ambulatory Visit (HOSPITAL_COMMUNITY): Payer: PPO

## 2021-04-22 DIAGNOSIS — M810 Age-related osteoporosis without current pathological fracture: Secondary | ICD-10-CM

## 2021-04-22 DIAGNOSIS — J439 Emphysema, unspecified: Secondary | ICD-10-CM | POA: Insufficient documentation

## 2021-04-22 DIAGNOSIS — I700 Atherosclerosis of aorta: Secondary | ICD-10-CM | POA: Insufficient documentation

## 2021-04-22 DIAGNOSIS — Z122 Encounter for screening for malignant neoplasm of respiratory organs: Secondary | ICD-10-CM | POA: Insufficient documentation

## 2021-04-22 DIAGNOSIS — F17210 Nicotine dependence, cigarettes, uncomplicated: Secondary | ICD-10-CM | POA: Insufficient documentation

## 2021-04-22 DIAGNOSIS — D0512 Intraductal carcinoma in situ of left breast: Secondary | ICD-10-CM

## 2021-04-22 DIAGNOSIS — Z87891 Personal history of nicotine dependence: Secondary | ICD-10-CM | POA: Diagnosis not present

## 2021-04-22 LAB — CBC WITH DIFFERENTIAL/PLATELET
Abs Immature Granulocytes: 0.06 10*3/uL (ref 0.00–0.07)
Basophils Absolute: 0.1 10*3/uL (ref 0.0–0.1)
Basophils Relative: 1 %
Eosinophils Absolute: 0.1 10*3/uL (ref 0.0–0.5)
Eosinophils Relative: 1 %
HCT: 42.2 % (ref 36.0–46.0)
Hemoglobin: 13.7 g/dL (ref 12.0–15.0)
Immature Granulocytes: 0 %
Lymphocytes Relative: 31 %
Lymphs Abs: 4.6 10*3/uL — ABNORMAL HIGH (ref 0.7–4.0)
MCH: 29.5 pg (ref 26.0–34.0)
MCHC: 32.5 g/dL (ref 30.0–36.0)
MCV: 90.8 fL (ref 80.0–100.0)
Monocytes Absolute: 0.9 10*3/uL (ref 0.1–1.0)
Monocytes Relative: 6 %
Neutro Abs: 9 10*3/uL — ABNORMAL HIGH (ref 1.7–7.7)
Neutrophils Relative %: 61 %
Platelets: 311 10*3/uL (ref 150–400)
RBC: 4.65 MIL/uL (ref 3.87–5.11)
RDW: 13.5 % (ref 11.5–15.5)
WBC: 14.7 10*3/uL — ABNORMAL HIGH (ref 4.0–10.5)
nRBC: 0 % (ref 0.0–0.2)

## 2021-04-22 LAB — COMPREHENSIVE METABOLIC PANEL
ALT: 13 U/L (ref 0–44)
AST: 19 U/L (ref 15–41)
Albumin: 4 g/dL (ref 3.5–5.0)
Alkaline Phosphatase: 58 U/L (ref 38–126)
Anion gap: 11 (ref 5–15)
BUN: 11 mg/dL (ref 8–23)
CO2: 27 mmol/L (ref 22–32)
Calcium: 9.2 mg/dL (ref 8.9–10.3)
Chloride: 102 mmol/L (ref 98–111)
Creatinine, Ser: 0.86 mg/dL (ref 0.44–1.00)
GFR, Estimated: >60 mL/min (ref >60)
Glucose, Bld: 111 mg/dL — ABNORMAL HIGH (ref 70–99)
Potassium: 3.9 mmol/L (ref 3.5–5.1)
Sodium: 140 mmol/L (ref 135–145)
Total Bilirubin: 0.4 mg/dL (ref 0.3–1.2)
Total Protein: 7.6 g/dL (ref 6.5–8.1)

## 2021-04-22 LAB — VITAMIN B12: Vitamin B-12: 429 pg/mL (ref 180–914)

## 2021-04-22 LAB — MAGNESIUM: Magnesium: 2.2 mg/dL (ref 1.7–2.4)

## 2021-04-22 LAB — LACTATE DEHYDROGENASE: LDH: 165 U/L (ref 98–192)

## 2021-04-22 LAB — VITAMIN D 25 HYDROXY (VIT D DEFICIENCY, FRACTURES): Vit D, 25-Hydroxy: 66.4 ng/mL (ref 30–100)

## 2021-04-29 ENCOUNTER — Inpatient Hospital Stay (HOSPITAL_BASED_OUTPATIENT_CLINIC_OR_DEPARTMENT_OTHER): Payer: PPO | Admitting: Hematology

## 2021-04-29 VITALS — BP 140/66 | HR 87 | Temp 99.0°F | Resp 18 | Ht 63.19 in | Wt 193.5 lb

## 2021-04-29 DIAGNOSIS — D0512 Intraductal carcinoma in situ of left breast: Secondary | ICD-10-CM | POA: Diagnosis not present

## 2021-04-29 DIAGNOSIS — Z79810 Long term (current) use of selective estrogen receptor modulators (SERMs): Secondary | ICD-10-CM | POA: Diagnosis not present

## 2021-04-29 DIAGNOSIS — M810 Age-related osteoporosis without current pathological fracture: Secondary | ICD-10-CM

## 2021-04-29 NOTE — Progress Notes (Signed)
? ?Melissa Weaver ?[address] ?[place], [place] [address] ? ? ?Patient Care Team: ?[name], MD as PCP - General (Internal Medicine) ? ?SUMMARY OF ONCOLOGIC HISTORY: ?Oncology History  ?DCIS (ductal carcinoma in situ)  ?04/12/2017 Initial Diagnosis  ? DCIS (ductal carcinoma in situ) ?  ?09/03/2017 Genetic Testing  ? Negative genetic testing on the multi-cancer panel.  The Multi-Gene Panel offered by [facility] includes sequencing and/or deletion duplication testing of the following 84 genes: AIP, ALK, APC, ATM, AXIN2,BAP1,  BARD1, BLM, BMPR1A, BRCA1, BRCA2, BRIP1, CASR, CDC73, CDH1, CDK4, CDKN1B, CDKN1C, CDKN2A (p14ARF), CDKN2A (p16INK4a), CEBPA, CHEK2, CTNNA1, DICER1, DIS3L2, EGFR (c.2369C>T, p.Thr790Met variant only), EPCAM (Deletion/duplication testing only), FH, FLCN, GATA2, GPC3, GREM1 (Promoter region deletion/duplication testing only), HOXB13 (c.251G>A, p.Gly84Glu), HRAS, KIT, MAX, MEN1, MET, MITF (c.952G>A, p.Glu318Lys variant only), MLH1, MSH2, MSH3, MSH6, MUTYH, NBN, NF1, NF2, NTHL1, PALB2, PDGFRA, PHOX2B, PMS2, POLD1, POLE, POT1, PRKAR1A, PTCH1, PTEN, RAD50, RAD51C, RAD51D, RB1, RECQL4, RET, RUNX1, SDHAF2, SDHA (sequence changes only), SDHB, SDHC, SDHD, SMAD4, SMARCA4, SMARCB1, SMARCE1, STK11, SUFU, TERC, TERT, TMEM127, TP53, TSC1, TSC2, VHL, WRN and WT1.  The report date is September 03, 2017. ?  ? ? ?CHIEF COMPLIANT: Follow-up of left breast DCIS ? ? ?INTERVAL HISTORY: Ms. Melissa Weaver is a 71 y.o. female here today for follow up of her left breast DCIS. Her last visit was on 10/27/2020.  ? ?Today she reports feeling good. She is taking tamoxifen and tolerating it well. She reports occasional tolerable hot flashes. She denies recent infections. She reports she is tolerating Prolia well, and she denies dental and jaw pain.  ? ?REVIEW OF SYSTEMS:   ?Review of Systems  ?Constitutional:  Positive for fatigue. Negative for appetite change.  ?Respiratory:  Positive for cough and shortness of breath.    ?Gastrointestinal:  Positive for diarrhea.  ?Endocrine: Positive for hot flashes (occasional).  ?Genitourinary:  Positive for frequency.   ?Musculoskeletal:  Positive for back pain.  ?Neurological:  Positive for headaches and light-headedness.  ?Psychiatric/Behavioral:  Positive for depression and sleep disturbance. The patient is nervous/anxious.   ?All other systems reviewed and are negative. ? ?I have reviewed the past medical history, past surgical history, social history and family history with the patient and they are unchanged from previous note. ? ? ?ALLERGIES:   ?has No Known Allergies. ? ? ?MEDICATIONS:  ?Current Outpatient Medications  ?Medication Sig Dispense Refill  ? acetaminophen (TYLENOL) 650 MG CR tablet Take 1,300 mg by mouth daily as needed for pain. (Patient not taking: Reported on 10/27/2020)    ? ALPRAZolam (XANAX) 0.25 MG tablet Take 0.25 mg by mouth 3 (three) times daily as needed for anxiety.  (Patient not taking: Reported on 10/27/2020)    ? atorvastatin (LIPITOR) 10 MG tablet Take 10 mg by mouth daily.     ? benzonatate (TESSALON) 100 MG capsule Take 100 mg by mouth 3 (three) times daily as needed.    ? buPROPion (WELLBUTRIN XL) 150 MG 24 hr tablet Take 1 tablet (150 mg total) by mouth daily. 30 tablet 1  ? cholecalciferol (VITAMIN D) 1000 units tablet Take 5,000 Units by mouth daily.    ? denosumab (PROLIA) 60 MG/ML SOLN injection Inject 60 mg into the skin every 6 (six) months. Administer in upper arm, thigh, or abdomen    ? escitalopram (LEXAPRO) 10 MG tablet Take 10 mg by mouth daily.     ? Magnesium 400 MG TABS Take 200 mg by mouth daily.     ?  MODERNA COVID-19 VACCINE 100 MCG/0.5ML injection     ? nystatin (MYCOSTATIN) 100000 UNIT/ML suspension Take by mouth.    ? tamoxifen (NOLVADEX) 20 MG tablet Take 1 tablet (20 mg total) by mouth daily. 90 tablet 3  ? ?No current facility-administered medications for this visit.  ? ? ? ?PHYSICAL EXAMINATION: ?Performance status (ECOG): 1 -  Symptomatic but completely ambulatory ? ?Vitals:  ? 04/29/21 1126  ?BP: 140/66  ?Pulse: 87  ?Resp: 18  ?Temp: 99 ?F (37.2 ?C)  ?SpO2: 95%  ? ?Wt Readings from Last 3 Encounters:  ?04/29/21 193 lb 8 oz (87.8 kg)  ?10/27/20 188 lb 6.4 oz (85.5 kg)  ?04/08/20 190 lb (86.2 kg)  ? ?Physical Exam ?Vitals reviewed.  ?Constitutional:   ?   Appearance: Normal appearance. She is obese.  ?Cardiovascular:  ?   Rate and Rhythm: Normal rate and regular rhythm.  ?   Pulses: Normal pulses.  ?   Heart sounds: Normal heart sounds.  ?Pulmonary:  ?   Effort: Pulmonary effort is normal.  ?   Breath sounds: Normal breath sounds.  ?Neurological:  ?   General: No focal deficit present.  ?   Mental Status: She is alert and oriented to person, place, and time.  ?Psychiatric:     ?   Mood and Affect: Mood normal.     ?   Behavior: Behavior normal.  ? ? ?Breast Exam Chaperone: Thana Ates   ? ? ?LABORATORY DATA:  ?I have reviewed the data as listed ? ?  Latest Ref Rng & Units 04/22/2021  ? 11:34 AM 10/20/2020  ? 10:33 AM 04/01/2020  ?  9:42 AM  ?CMP  ?Glucose 70 - 99 mg/dL 111   105   118    ?BUN 8 - 23 mg/dL 11   12   9     ?Creatinine 0.44 - 1.00 mg/dL 0.86   0.77   0.75    ?Sodium 135 - 145 mmol/L 140   137   141    ?Potassium 3.5 - 5.1 mmol/L 3.9   4.5   4.8    ?Chloride 98 - 111 mmol/L 102   103   104    ?CO2 22 - 32 mmol/L 27   27   27     ?Calcium 8.9 - 10.3 mg/dL 9.2   9.4   9.3    ?Total Protein 6.5 - 8.1 g/dL 7.6   7.5   6.9    ?Total Bilirubin 0.3 - 1.2 mg/dL 0.4   0.6   0.4    ?Alkaline Phos 38 - 126 U/L 58   63   55    ?AST 15 - 41 U/L 19   15   14     ?ALT 0 - 44 U/L 13   15   12     ? ?No results found for: [id number] ?Lab Results  ?Component Value Date  ? WBC 14.7 (H) 04/22/2021  ? HGB 13.7 04/22/2021  ? HCT 42.2 04/22/2021  ? MCV 90.8 04/22/2021  ? PLT 311 04/22/2021  ? NEUTROABS 9.0 (H) 04/22/2021  ? ? ?ASSESSMENT:  ?1.  Left breast DCIS: ?- Abnormal mammogram on 02/24/2017, after 10 years of no mammograms. ?-Biopsy of the left breast on  03/31/2017, DCIS, ER/PR positive ?-Left lumpectomy on 05/11/2017, intermediate grade DCIS, margins negative ?- She completed radiation therapy. ?- Tamoxifen was started around May 2019. ?  ?2.  Osteoporosis: Her last DEXA scan on 02/24/2017 shows osteoporosis with a T score  of -3.2.  She is receiving Prolia through her PCP. ? ? ?PLAN:  ?Left breast DCIS: ?- She is tolerating tamoxifen reasonably well.  Occasional hot flashes present. ?- Reviewed mammogram from 04/03/2021 which was BI-RADS Category 2. ?- She will continue tamoxifen until May 2024. ?- RTC 6 months with repeat labs and exam. ?  ?Osteoporosis: ?- DEXA scan on 04/21/2020 with T score -2.7. ?- We will continue Prolia every 6 months, ordered through Dr. Nevada Crane.  Continue calcium and vitamin D supplements.  Vitamin D level was 66. ? ?3.  Smoking history: ?- Reviewed CT chest dated 04/23/2021, lung RADS 2.  Will do low-dose chest CT in 12 months. ? ?Breast Cancer therapy associated bone loss: I have recommended calcium, Vitamin D and weight bearing exercises. ? ?Orders placed this encounter:  ?Orders Placed This Encounter  ?Procedures  ? CBC with Differential  ? Comprehensive metabolic panel  ? VITAMIN D 25 Hydroxy (Vit-D Deficiency, Fractures)  ? ? ?The patient has a good understanding of the overall plan. She agrees with it. She will call with any problems that may develop before the next visit here. ? ?Derek Jack, MD ?[place] ?[phone] ? ? ?I, Thana Ates, am acting as a scribe for Dr. Derek Jack. ? ?I, Derek Jack MD, have reviewed the above documentation for accuracy and completeness, and I agree with the above. ?  ? ? ?

## 2021-04-29 NOTE — Patient Instructions (Signed)
[place] at [facility] ?Discharge Instructions ? ? ?You were seen and examined today by Dr. Delton Coombes. ? ?He reviewed the results of your lab work and CT scan. All results are normal/stable.  ? ?Return as scheduled in 6 months.  ? ? ?Thank you for choosing [place] at [facility] to provide your oncology and hematology care.  To afford each patient quality time with our provider, please arrive at least 15 minutes before your scheduled appointment time.  ? ?If you have a lab appointment with the [facility] please come in thru the Main Entrance and check in at the main information desk. ? ?You need to re-schedule your appointment should you arrive 10 or more minutes late.  We strive to give you quality time with our providers, and arriving late affects you and other patients whose appointments are after yours.  Also, if you no show three or more times for appointments you may be dismissed from the clinic at the providers discretion.     ?Again, thank you for choosing [facility]  Our hope is that these requests will decrease the amount of time that you wait before being seen by our physicians.       ?_____________________________________________________________ ? ?Should you have questions after your visit to [facility], please contact our office at [phone] and follow the prompts.  Our office hours are 8:00 a.m. and 4:30 p.m. Monday - Friday.  Please note that voicemails left after 4:00 p.m. may not be returned until the following business day.  We are closed weekends and major holidays.  You do have access to a nurse 24-7, just call the main number to the clinic [phone] and do not press any options, hold on the line and a nurse will answer the phone.   ? ?For prescription refill requests, have your pharmacy contact our office and allow 72 hours.   ? ?Due to Covid, you will need to wear a mask upon entering the hospital. If  you do not have a mask, a mask will be given to you at the Main Entrance upon arrival. For doctor visits, patients may have 1 support person age 38 or older with them. For treatment visits, patients can not have anyone with them due to social distancing guidelines and our immunocompromised population.  ? ?   ?

## 2021-04-30 DIAGNOSIS — R7303 Prediabetes: Secondary | ICD-10-CM | POA: Diagnosis not present

## 2021-04-30 DIAGNOSIS — D649 Anemia, unspecified: Secondary | ICD-10-CM | POA: Diagnosis not present

## 2021-04-30 DIAGNOSIS — I10 Essential (primary) hypertension: Secondary | ICD-10-CM | POA: Diagnosis not present

## 2021-05-05 ENCOUNTER — Other Ambulatory Visit (HOSPITAL_COMMUNITY): Payer: Self-pay | Admitting: Family Medicine

## 2021-05-05 ENCOUNTER — Other Ambulatory Visit: Payer: Self-pay | Admitting: Family Medicine

## 2021-05-05 DIAGNOSIS — Z853 Personal history of malignant neoplasm of breast: Secondary | ICD-10-CM | POA: Diagnosis not present

## 2021-05-05 DIAGNOSIS — R591 Generalized enlarged lymph nodes: Secondary | ICD-10-CM

## 2021-05-05 DIAGNOSIS — E785 Hyperlipidemia, unspecified: Secondary | ICD-10-CM | POA: Diagnosis not present

## 2021-05-05 DIAGNOSIS — F17200 Nicotine dependence, unspecified, uncomplicated: Secondary | ICD-10-CM | POA: Diagnosis not present

## 2021-05-05 DIAGNOSIS — I10 Essential (primary) hypertension: Secondary | ICD-10-CM | POA: Diagnosis not present

## 2021-05-05 DIAGNOSIS — R7303 Prediabetes: Secondary | ICD-10-CM | POA: Diagnosis not present

## 2021-05-15 ENCOUNTER — Ambulatory Visit (HOSPITAL_COMMUNITY)
Admission: RE | Admit: 2021-05-15 | Discharge: 2021-05-15 | Disposition: A | Discharge status: Home / Self Care | Payer: PPO | Source: Ambulatory Visit | Attending: Family Medicine | Admitting: Family Medicine

## 2021-05-15 DIAGNOSIS — R591 Generalized enlarged lymph nodes: Secondary | ICD-10-CM | POA: Diagnosis not present

## 2021-05-15 DIAGNOSIS — R590 Localized enlarged lymph nodes: Secondary | ICD-10-CM | POA: Diagnosis not present

## 2021-05-17 DIAGNOSIS — I10 Essential (primary) hypertension: Secondary | ICD-10-CM | POA: Diagnosis not present

## 2021-05-17 DIAGNOSIS — E785 Hyperlipidemia, unspecified: Secondary | ICD-10-CM | POA: Diagnosis not present

## 2021-05-18 ENCOUNTER — Other Ambulatory Visit (HOSPITAL_COMMUNITY): Payer: Self-pay | Admitting: Family Medicine

## 2021-05-18 ENCOUNTER — Other Ambulatory Visit: Payer: Self-pay | Admitting: Family Medicine

## 2021-05-18 DIAGNOSIS — R591 Generalized enlarged lymph nodes: Secondary | ICD-10-CM

## 2021-06-05 ENCOUNTER — Ambulatory Visit (HOSPITAL_COMMUNITY)
Admission: RE | Admit: 2021-06-05 | Discharge: 2021-06-05 | Disposition: A | Discharge status: Home / Self Care | Payer: PPO | Source: Ambulatory Visit | Attending: Family Medicine | Admitting: Family Medicine

## 2021-06-05 DIAGNOSIS — R591 Generalized enlarged lymph nodes: Secondary | ICD-10-CM | POA: Diagnosis not present

## 2021-06-05 DIAGNOSIS — I6523 Occlusion and stenosis of bilateral carotid arteries: Secondary | ICD-10-CM | POA: Diagnosis not present

## 2021-06-05 DIAGNOSIS — E041 Nontoxic single thyroid nodule: Secondary | ICD-10-CM | POA: Diagnosis not present

## 2021-06-05 DIAGNOSIS — M47812 Spondylosis without myelopathy or radiculopathy, cervical region: Secondary | ICD-10-CM | POA: Diagnosis not present

## 2021-06-05 DIAGNOSIS — R590 Localized enlarged lymph nodes: Secondary | ICD-10-CM | POA: Diagnosis not present

## 2021-06-05 MED ORDER — IOHEXOL 300 MG/ML  SOLN
75.0000 mL | Freq: Once | INTRAMUSCULAR | Status: AC | PRN
  Administered 2021-06-05: 75 mL via INTRAVENOUS

## 2021-06-09 LAB — POCT I-STAT CREATININE: Creatinine, Ser: 0.9 mg/dL (ref 0.44–1.00)

## 2021-07-17 DIAGNOSIS — E785 Hyperlipidemia, unspecified: Secondary | ICD-10-CM | POA: Diagnosis not present

## 2021-07-17 DIAGNOSIS — I10 Essential (primary) hypertension: Secondary | ICD-10-CM | POA: Diagnosis not present

## 2021-08-13 DIAGNOSIS — R221 Localized swelling, mass and lump, neck: Secondary | ICD-10-CM | POA: Diagnosis not present

## 2021-08-13 DIAGNOSIS — R590 Localized enlarged lymph nodes: Secondary | ICD-10-CM | POA: Diagnosis not present

## 2021-08-13 DIAGNOSIS — F17200 Nicotine dependence, unspecified, uncomplicated: Secondary | ICD-10-CM | POA: Diagnosis not present

## 2021-08-18 ENCOUNTER — Other Ambulatory Visit: Payer: Self-pay | Admitting: Otolaryngology

## 2021-08-18 ENCOUNTER — Other Ambulatory Visit (HOSPITAL_COMMUNITY): Payer: Self-pay | Admitting: Otolaryngology

## 2021-08-18 DIAGNOSIS — R590 Localized enlarged lymph nodes: Secondary | ICD-10-CM

## 2021-08-18 NOTE — Progress Notes (Unsigned)
Melissa Mckusick, DO  Donita Brooks D [place] for US guided right neck nodule/mass biopsy.   Inferior to right parotid.   Melissa Weaver

## 2021-09-09 ENCOUNTER — Other Ambulatory Visit (HOSPITAL_COMMUNITY): Payer: Self-pay | Admitting: Physician Assistant

## 2021-09-10 ENCOUNTER — Encounter (HOSPITAL_COMMUNITY): Payer: Self-pay

## 2021-09-10 ENCOUNTER — Ambulatory Visit (HOSPITAL_COMMUNITY)
Admission: RE | Admit: 2021-09-10 | Discharge: 2021-09-10 | Disposition: A | Discharge status: Home / Self Care | Payer: PPO | Source: Ambulatory Visit | Attending: Otolaryngology | Admitting: Otolaryngology

## 2021-09-10 ENCOUNTER — Other Ambulatory Visit (HOSPITAL_COMMUNITY): Payer: Self-pay | Admitting: Otolaryngology

## 2021-09-10 ENCOUNTER — Other Ambulatory Visit: Payer: Self-pay

## 2021-09-10 DIAGNOSIS — R591 Generalized enlarged lymph nodes: Secondary | ICD-10-CM | POA: Insufficient documentation

## 2021-09-10 DIAGNOSIS — R590 Localized enlarged lymph nodes: Secondary | ICD-10-CM | POA: Diagnosis not present

## 2021-09-10 DIAGNOSIS — F418 Other specified anxiety disorders: Secondary | ICD-10-CM | POA: Diagnosis not present

## 2021-09-10 DIAGNOSIS — I10 Essential (primary) hypertension: Secondary | ICD-10-CM | POA: Diagnosis not present

## 2021-09-10 DIAGNOSIS — E785 Hyperlipidemia, unspecified: Secondary | ICD-10-CM | POA: Insufficient documentation

## 2021-09-10 DIAGNOSIS — R221 Localized swelling, mass and lump, neck: Secondary | ICD-10-CM | POA: Diagnosis not present

## 2021-09-10 MED ORDER — SODIUM CHLORIDE 0.9 % IV SOLN: INTRAVENOUS | Status: DC

## 2021-09-10 MED ORDER — LIDOCAINE HCL (PF) 1 % IJ SOLN
INTRAMUSCULAR | Status: AC
  Filled 2021-09-10: qty 30

## 2021-09-10 MED ORDER — MIDAZOLAM HCL 2 MG/2ML IJ SOLN
INTRAMUSCULAR | Status: AC | PRN
Start: 2021-09-10 — End: 2021-09-10
  Administered 2021-09-10: 1 mg via INTRAVENOUS

## 2021-09-10 MED ORDER — FENTANYL CITRATE (PF) 100 MCG/2ML IJ SOLN
INTRAMUSCULAR | Status: AC | PRN
  Administered 2021-09-10: 25 ug via INTRAVENOUS

## 2021-09-10 MED ORDER — MIDAZOLAM HCL 2 MG/2ML IJ SOLN
INTRAMUSCULAR | Status: AC
  Filled 2021-09-10: qty 2

## 2021-09-10 MED ORDER — FENTANYL CITRATE (PF) 100 MCG/2ML IJ SOLN
INTRAMUSCULAR | Status: AC
  Filled 2021-09-10: qty 2

## 2021-09-10 NOTE — Procedures (Signed)
Interventional Radiology Procedure Note  Procedure: US guided biopsy of right neck mass/lymph node Complications: None EBL: None Specimen: Mx 18g core  Recommendations: - Bedrest 1 hours.   - Routine wound care - Follow up pathology - Advance diet  - DC 1 hr  Signed,  Corrie Mckusick, DO

## 2021-09-10 NOTE — H&P (Signed)
Chief Complaint: Patient was seen in consultation today for lymphadenopathy  Referring Physician(s): Weaver,Melissa  Supervising Physician: Melissa Weaver  Patient Status: [name] - Out-pt  History of Present Illness: Melissa Weaver is a 71 y.o. 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  Patient evaluated by Dr. Constance Weaver who performed in-office biopsy.  However, this did not yield a working diagnosis.  She is now referred to IR for lymph node biopsy.  Case reviewed and approved by Dr. Earleen Weaver.   Melissa Weaver presents today in her usual state of health.  She has been NPO.  She does not take blood thinners.  She is agreeable to proceed with sedation.  Her sister, Melissa Weaver will be provided transportation and care at home.  She denies new or concerning symptoms since finding lymph node ~3 months ago.   Past Medical History:  Diagnosis Date   Anxiety    Arthritis    Depression    Family history of colon cancer    Family history of melanoma    Family history of pancreatic cancer    HLD (hyperlipidemia)    HTN (hypertension)    Seizures ([place])    had a seizure as child, unknown etiology, no meds though. No seizures since 10 years.    Past Surgical History:  Procedure Laterality Date   PARTIAL MASTECTOMY WITH NEEDLE LOCALIZATION Left 05/11/2017   Procedure: PARTIAL MASTECTOMY WITH NEEDLE LOCALIZATION;  Surgeon: Virl Cagey, MD;  Location: [facility];  Service: General;  Laterality: Left;   TUBAL LIGATION      Allergies: Patient has no known allergies.  Medications: Prior to Admission medications   Medication Sig Start Date End Date Taking? Authorizing Provider  acetaminophen (TYLENOL) 650 MG CR tablet Take 1,300 mg by mouth daily as needed for pain.   Yes [provider]  ALPRAZolam (XANAX) 0.25 MG tablet Take 0.25 mg by mouth 3 (three) times daily as needed  for anxiety.   Yes [provider]  atorvastatin (LIPITOR) 10 MG tablet Take 10 mg by mouth every evening. 09/05/18  Yes [provider]  buPROPion (WELLBUTRIN XL) 150 MG 24 hr tablet Take 150 mg by mouth daily.   Yes [provider]  Calcium Carb-Cholecalciferol (CALCIUM 600+D3 PO) Take 1 tablet by mouth in the morning and at bedtime.   Yes [provider]  Cholecalciferol (VITAMIN D) 50 MCG (2000 UT) CAPS Take 2,000 Units by mouth daily.   Yes [provider]  escitalopram (LEXAPRO) 10 MG tablet Take 10 mg by mouth daily.  01/03/17  Yes [provider]  ferrous sulfate 325 (65 FE) MG tablet Take 325 mg by mouth once a week.   Yes [provider]  Magnesium 400 MG TABS Take 400 mg by mouth daily.   Yes [provider]  nicotine (NICODERM CQ - DOSED IN MG/24 HOURS) 21 mg/24hr patch Place 21 mg onto the skin daily.   Yes [provider]  Omega-3 Fatty Acids (FISH OIL) 300 MG CAPS Take 300 mg by mouth in the morning and at bedtime.   Yes [provider]  tamoxifen (NOLVADEX) 20 MG tablet Take 1 tablet (20 mg total) by mouth daily. 11/09/18  Yes Lockamy, Melissa L, NP-C  denosumab (PROLIA) 60 MG/ML SOLN injection Inject 60 mg into the skin every 6 (six) months. Administer in upper arm, thigh, or abdomen  [provider]     Family History  Problem Relation Age of Onset   Pancreatic cancer Mother 63       d. 41   Atrial fibrillation Father    COPD Father    Hypertension Father    High Cholesterol Father    Pancreatic cancer Maternal Aunt 88       d. 39   Pancreatic cancer Maternal Uncle        dx and died in his 39s   Lung cancer Maternal Grandfather 92       d. 36   Heart attack Maternal Grandfather 92       d. 92   Lung cancer Maternal Aunt    Leukemia Maternal Uncle        dx in his 30s-40s   Heart attack Maternal Uncle    Melanoma Other        MGMs sister   Colon cancer Other         MGMs mother    Social History   Socioeconomic History   Marital status: Widowed    Spouse name: Not on file   Number of children: Not on file   Years of education: Not on file   Highest education level: Not on file  Occupational History   Not on file  Tobacco Use   Smoking status: Every Day    Packs/day: 1.00    Years: 40.00    Total pack years: 40.00    Types: Cigarettes   Smokeless tobacco: Never  Vaping Use   Vaping Use: Never used  Substance and Sexual Activity   Alcohol use: Never   Drug use: Never   Sexual activity: Yes    Birth control/protection: None  Other Topics Concern   Not on file  Social History Narrative   Not on file   Social Determinants of Health   Financial Resource Strain: Not on file  Food Insecurity: Not on file  Transportation Needs: Not on file  Physical Activity: Not on file  Stress: Not on file  Social Connections: Not on file     Review of Systems: A 12 point ROS discussed and pertinent positives are indicated in the HPI above.  All other systems are negative.  Review of Systems  Constitutional:  Negative for fatigue and fever.  Respiratory:  Negative for cough and shortness of breath.   Cardiovascular:  Negative for chest pain.  Gastrointestinal:  Negative for abdominal pain.  Genitourinary:  Negative for dysuria.  Musculoskeletal:  Negative for back pain.  Psychiatric/Behavioral:  Negative for behavioral problems and confusion.     Vital Signs: BP (!) 151/75   Pulse 69   Resp 17   Ht '5\' 2"'$  (1.575 m)   Wt 181 lb (82.1 kg)   SpO2 98%   BMI 33.11 kg/m   Physical Exam Vitals and nursing note reviewed.  Constitutional:      General: She is not in acute distress.    Appearance: Normal appearance. She is not ill-appearing.  HENT:     Mouth/Throat:     Mouth: Mucous membranes are moist.     Pharynx: Oropharynx is clear.  Cardiovascular:     Rate and Rhythm: Normal rate and regular rhythm.  Pulmonary:     Effort:  Pulmonary effort is normal. No respiratory distress.     Breath sounds: Normal breath sounds.  Abdominal:     General: Abdomen is flat.     Palpations: Abdomen is soft.  Lymphadenopathy:  Cervical: Cervical adenopathy (palpable, right) present.  Skin:    General: Skin is warm and dry.  Neurological:     General: No focal deficit present.     Mental Status: She is alert and oriented to person, place, and time.      MD Evaluation Airway: WNL Heart: WNL Abdomen: WNL Chest/ Lungs: WNL ASA  Classification: 3 Mallampati/Airway Score: Two   Imaging: No results found.  Labs:  CBC: Recent Labs    10/20/20 1033 04/22/21 1134  WBC 11.3* 14.7*  HGB 14.1 13.7  HCT 44.5 42.2  PLT 277 311    COAGS: No results for input(s): "INR", "APTT" in the last 8760 hours.  BMP: Recent Labs    10/20/20 1033 04/22/21 1134 06/05/21 0802  NA 137 140  --   K 4.5 3.9  --   CL 103 102  --   CO2 27 27  --   GLUCOSE 105* 111*  --   BUN 12 11  --   CALCIUM 9.4 9.2  --   CREATININE 0.77 0.86 0.90  GFRNONAA >60 >60  --     LIVER FUNCTION TESTS: Recent Labs    10/20/20 1033 04/22/21 1134  BILITOT 0.6 0.4  AST 15 19  ALT 15 13  ALKPHOS 63 58  PROT 7.5 7.6  ALBUMIN 3.8 4.0    TUMOR MARKERS: No results for input(s): "AFPTM", "CEA", "CA199", "CHROMGRNA" in the last 8760 hours.  Assessment and Plan: Patient with past medical history of HTN, HLD, anxiety/depression presents with complaint of lymphadenopathy.  IR consulted for lymph node biopsy at the request of Dr. Constance Weaver. Case reviewed by Dr. Earleen Weaver who approves patient for procedure.  Patient presents today in their usual state of health.  She has been NPO and is not currently on blood thinners.   Risks and benefits of lymphadenopathy was discussed with the patient and/or patient's family including, but not limited to bleeding, infection, damage to adjacent structures or low yield requiring additional tests.  All of the  questions were answered and there is agreement to proceed.  Consent signed and in chart.   Thank you for this interesting consult.  I greatly enjoyed meeting Melissa Weaver and look forward to participating in their care.  A copy of this report was sent to the requesting provider on this date.  Electronically Signed: Docia Barrier, PA 09/10/2021, 12:47 PM   I spent a total of  30 Minutes   in face to face in clinical consultation, greater than 50% of which was counseling/coordinating care for lymphadenopathy.

## 2021-09-11 LAB — SURGICAL PATHOLOGY

## 2021-10-01 ENCOUNTER — Encounter (HOSPITAL_COMMUNITY): Payer: PPO

## 2021-10-06 DIAGNOSIS — D239 Other benign neoplasm of skin, unspecified: Secondary | ICD-10-CM | POA: Diagnosis not present

## 2021-10-06 DIAGNOSIS — D225 Melanocytic nevi of trunk: Secondary | ICD-10-CM | POA: Diagnosis not present

## 2021-10-06 DIAGNOSIS — L570 Actinic keratosis: Secondary | ICD-10-CM | POA: Diagnosis not present

## 2021-10-06 DIAGNOSIS — D485 Neoplasm of uncertain behavior of skin: Secondary | ICD-10-CM | POA: Diagnosis not present

## 2021-10-06 DIAGNOSIS — Z1283 Encounter for screening for malignant neoplasm of skin: Secondary | ICD-10-CM | POA: Diagnosis not present

## 2021-10-21 ENCOUNTER — Inpatient Hospital Stay: Payer: PPO | Attending: Hematology

## 2021-10-21 DIAGNOSIS — D0512 Intraductal carcinoma in situ of left breast: Secondary | ICD-10-CM | POA: Insufficient documentation

## 2021-10-21 DIAGNOSIS — M810 Age-related osteoporosis without current pathological fracture: Secondary | ICD-10-CM | POA: Insufficient documentation

## 2021-10-21 DIAGNOSIS — Z79810 Long term (current) use of selective estrogen receptor modulators (SERMs): Secondary | ICD-10-CM | POA: Insufficient documentation

## 2021-10-21 LAB — CBC WITH DIFFERENTIAL/PLATELET
Abs Immature Granulocytes: 0.05 10*3/uL (ref 0.00–0.07)
Basophils Absolute: 0.1 10*3/uL (ref 0.0–0.1)
Basophils Relative: 1 %
Eosinophils Absolute: 0.1 10*3/uL (ref 0.0–0.5)
Eosinophils Relative: 1 %
HCT: 42.4 % (ref 36.0–46.0)
Hemoglobin: 13.6 g/dL (ref 12.0–15.0)
Immature Granulocytes: 0 %
Lymphocytes Relative: 33 %
Lymphs Abs: 3.8 10*3/uL (ref 0.7–4.0)
MCH: 28.9 pg (ref 26.0–34.0)
MCHC: 32.1 g/dL (ref 30.0–36.0)
MCV: 90.2 fL (ref 80.0–100.0)
Monocytes Absolute: 0.8 10*3/uL (ref 0.1–1.0)
Monocytes Relative: 7 %
Neutro Abs: 6.9 10*3/uL (ref 1.7–7.7)
Neutrophils Relative %: 58 %
Platelets: 287 10*3/uL (ref 150–400)
RBC: 4.7 MIL/uL (ref 3.87–5.11)
RDW: 13.3 % (ref 11.5–15.5)
WBC: 11.7 10*3/uL — ABNORMAL HIGH (ref 4.0–10.5)
nRBC: 0 % (ref 0.0–0.2)

## 2021-10-21 LAB — COMPREHENSIVE METABOLIC PANEL
ALT: 13 U/L (ref 0–44)
AST: 13 U/L — ABNORMAL LOW (ref 15–41)
Albumin: 3.9 g/dL (ref 3.5–5.0)
Alkaline Phosphatase: 52 U/L (ref 38–126)
Anion gap: 7 (ref 5–15)
BUN: 11 mg/dL (ref 8–23)
CO2: 29 mmol/L (ref 22–32)
Calcium: 9.5 mg/dL (ref 8.9–10.3)
Chloride: 101 mmol/L (ref 98–111)
Creatinine, Ser: 0.85 mg/dL (ref 0.44–1.00)
GFR, Estimated: >60 mL/min (ref >60)
Glucose, Bld: 102 mg/dL — ABNORMAL HIGH (ref 70–99)
Potassium: 4.4 mmol/L (ref 3.5–5.1)
Sodium: 137 mmol/L (ref 135–145)
Total Bilirubin: 0.4 mg/dL (ref 0.3–1.2)
Total Protein: 7.4 g/dL (ref 6.5–8.1)

## 2021-10-21 LAB — VITAMIN D 25 HYDROXY (VIT D DEFICIENCY, FRACTURES): Vit D, 25-Hydroxy: 46.53 ng/mL (ref 30–100)

## 2021-10-22 DIAGNOSIS — K118 Other diseases of salivary glands: Secondary | ICD-10-CM | POA: Diagnosis not present

## 2021-10-28 ENCOUNTER — Inpatient Hospital Stay (HOSPITAL_BASED_OUTPATIENT_CLINIC_OR_DEPARTMENT_OTHER): Payer: PPO | Admitting: Hematology

## 2021-10-28 ENCOUNTER — Encounter: Payer: Self-pay | Admitting: Hematology

## 2021-10-28 VITALS — BP 126/68 | HR 75 | Temp 98.5°F | Resp 18 | Ht 62.0 in | Wt 181.8 lb

## 2021-10-28 DIAGNOSIS — E041 Nontoxic single thyroid nodule: Secondary | ICD-10-CM | POA: Diagnosis not present

## 2021-10-28 DIAGNOSIS — Z122 Encounter for screening for malignant neoplasm of respiratory organs: Secondary | ICD-10-CM | POA: Diagnosis not present

## 2021-10-28 DIAGNOSIS — D0512 Intraductal carcinoma in situ of left breast: Secondary | ICD-10-CM | POA: Diagnosis not present

## 2021-10-28 DIAGNOSIS — I10 Essential (primary) hypertension: Secondary | ICD-10-CM | POA: Diagnosis not present

## 2021-10-28 DIAGNOSIS — R7303 Prediabetes: Secondary | ICD-10-CM | POA: Diagnosis not present

## 2021-10-28 DIAGNOSIS — E785 Hyperlipidemia, unspecified: Secondary | ICD-10-CM | POA: Diagnosis not present

## 2021-10-30 NOTE — Progress Notes (Signed)
[facility] [address], [place] [address]   Patient Care Team: [name], MD as PCP - General (Internal Medicine)  SUMMARY OF ONCOLOGIC HISTORY: Oncology History  DCIS (ductal carcinoma in situ)  04/12/2017 Initial Diagnosis   DCIS (ductal carcinoma in situ)   09/03/2017 Genetic Testing   Negative genetic testing on the multi-cancer panel.  The Multi-Gene Panel offered by [facility] includes sequencing and/or deletion duplication testing of the following 84 genes: AIP, ALK, APC, ATM, AXIN2,BAP1,  BARD1, BLM, BMPR1A, BRCA1, BRCA2, BRIP1, CASR, CDC73, CDH1, CDK4, CDKN1B, CDKN1C, CDKN2A (p14ARF), CDKN2A (p16INK4a), CEBPA, CHEK2, CTNNA1, DICER1, DIS3L2, EGFR (c.2369C>T, p.Thr790Met variant only), EPCAM (Deletion/duplication testing only), FH, FLCN, GATA2, GPC3, GREM1 (Promoter region deletion/duplication testing only), HOXB13 (c.251G>A, p.Gly84Glu), HRAS, KIT, MAX, MEN1, MET, MITF (c.952G>A, p.Glu318Lys variant only), MLH1, MSH2, MSH3, MSH6, MUTYH, NBN, NF1, NF2, NTHL1, PALB2, PDGFRA, PHOX2B, PMS2, POLD1, POLE, POT1, PRKAR1A, PTCH1, PTEN, RAD50, RAD51C, RAD51D, RB1, RECQL4, RET, RUNX1, SDHAF2, SDHA (sequence changes only), SDHB, SDHC, SDHD, SMAD4, SMARCA4, SMARCB1, SMARCE1, STK11, SUFU, TERC, TERT, TMEM127, TP53, TSC1, TSC2, VHL, WRN and WT1.  The report date is September 03, 2017.     CHIEF COMPLIANT: Follow-up of left breast DCIS   INTERVAL HISTORY: Ms. Melissa Weaver is a 71 y.o. female here for follow-up of left breast DCIS.  She is tolerating tamoxifen reasonably well.  Occasional tolerable hot flashes reported.  She has not received her last Prolia in September.  Denies any new onset pains.  Energy levels are 75%.  REVIEW OF SYSTEMS:   Review of Systems  Gastrointestinal:  Positive for diarrhea.  Endocrine: Positive for hot flashes (occasional).  All other systems reviewed and are negative.   I have reviewed the past medical history, past surgical history, social  history and family history with the patient and they are unchanged from previous note.   ALLERGIES:   has No Known Allergies.   MEDICATIONS:  Current Outpatient Medications  Medication Sig Dispense Refill   acetaminophen (TYLENOL) 650 MG CR tablet Take 1,300 mg by mouth daily as needed for pain.     ALPRAZolam (XANAX) 0.25 MG tablet Take 0.25 mg by mouth 3 (three) times daily as needed for anxiety.     amoxicillin (AMOXIL) 500 MG capsule Take 500 mg by mouth 3 (three) times daily.     atorvastatin (LIPITOR) 10 MG tablet Take 10 mg by mouth every evening.     atorvastatin (LIPITOR) 10 MG tablet Take 1 tablet by mouth every evening.     buPROPion (WELLBUTRIN XL) 150 MG 24 hr tablet Take 150 mg by mouth daily.     Calcium Carb-Cholecalciferol (CALCIUM 600+D3 PO) Take 1 tablet by mouth in the morning and at bedtime.     Cholecalciferol (VITAMIN D) 50 MCG (2000 UT) CAPS Take 2,000 Units by mouth daily.     Cholecalciferol 125 MCG (5000 UT) capsule Take 1 capsule every day by oral route.     denosumab (PROLIA) 60 MG/ML SOLN injection Inject 60 mg into the skin every 6 (six) months. Administer in upper arm, thigh, or abdomen     denosumab (PROLIA) 60 MG/ML SOSY injection INJECT 1 MILLILITER (60 MG) BY SUBCUTANEOUS ROUTE EVERY 6 MONTHS IN THE UPPER ARM, UPPER THIGH OR ABDOMEN     escitalopram (LEXAPRO) 10 MG tablet Take 10 mg by mouth daily.      escitalopram (LEXAPRO) 10 MG tablet Take 1 tablet by mouth daily with breakfast.  ferrous sulfate 325 (65 FE) MG tablet Take 325 mg by mouth once a week.     Magnesium 400 MG TABS Take 400 mg by mouth daily.     Omega-3 Fatty Acids (FISH OIL) 300 MG CAPS Take 300 mg by mouth in the morning and at bedtime.     tamoxifen (NOLVADEX) 20 MG tablet Take 1 tablet (20 mg total) by mouth daily. 90 tablet 3   tamoxifen (NOLVADEX) 20 MG tablet Take 1 tablet by mouth daily.     nicotine (NICODERM CQ - DOSED IN MG/24 HOURS) 21 mg/24hr patch Place 21 mg onto the  skin daily.     No current facility-administered medications for this visit.     PHYSICAL EXAMINATION: Performance status (ECOG): 1 - Symptomatic but completely ambulatory  Vitals:   10/28/21 1135  BP: 126/68  Pulse: 75  Resp: 18  Temp: 98.5 F (36.9 C)  SpO2: 96%   Wt Readings from Last 3 Encounters:  10/28/21 181 lb 12.8 oz (82.5 kg)  09/10/21 181 lb (82.1 kg)  04/29/21 193 lb 8 oz (87.8 kg)   Physical Exam Vitals reviewed.  Constitutional:      Appearance: Normal appearance. She is obese.  Cardiovascular:     Rate and Rhythm: Normal rate and regular rhythm.     Pulses: Normal pulses.     Heart sounds: Normal heart sounds.  Pulmonary:     Effort: Pulmonary effort is normal.     Breath sounds: Normal breath sounds.  Neurological:     General: No focal deficit present.     Mental Status: She is alert and oriented to person, place, and time.  Psychiatric:        Mood and Affect: Mood normal.        Behavior: Behavior normal.     Breast Exam Chaperone: Thana Ates     LABORATORY DATA:  I have reviewed the data as listed    Latest Ref Rng & Units 10/21/2021   10:51 AM 06/05/2021    8:02 AM 04/22/2021   11:34 AM  CMP  Glucose 70 - 99 mg/dL 102   111   BUN 8 - 23 mg/dL 11   11   Creatinine 0.44 - 1.00 mg/dL 0.85  0.90  0.86   Sodium 135 - 145 mmol/L 137   140   Potassium 3.5 - 5.1 mmol/L 4.4   3.9   Chloride 98 - 111 mmol/L 101   102   CO2 22 - 32 mmol/L 29   27   Calcium 8.9 - 10.3 mg/dL 9.5   9.2   Total Protein 6.5 - 8.1 g/dL 7.4   7.6   Total Bilirubin 0.3 - 1.2 mg/dL 0.4   0.4   Alkaline Phos 38 - 126 U/L 52   58   AST 15 - 41 U/L 13   19   ALT 0 - 44 U/L 13   13    No results found for: "CAN153" Lab Results  Component Value Date   WBC 11.7 (H) 10/21/2021   HGB 13.6 10/21/2021   HCT 42.4 10/21/2021   MCV 90.2 10/21/2021   PLT 287 10/21/2021   NEUTROABS 6.9 10/21/2021    ASSESSMENT:  1.  Left breast DCIS: - Abnormal mammogram on 02/24/2017,  after 10 years of no mammograms. -Biopsy of the left breast on 03/31/2017, DCIS, ER/PR positive -Left lumpectomy on 05/11/2017, intermediate grade DCIS, margins negative - She completed radiation therapy. - Tamoxifen was started around  May 2019.   2.  Osteoporosis: Her last DEXA scan on 02/24/2017 shows osteoporosis with a T score of -3.2.  She is receiving Prolia through her PCP.   PLAN:  Left breast DCIS: - She is tolerating tamoxifen reasonably well with minor hot flashes. - Mammogram on 04/03/2021 was BI-RADS Category 2. - Labs reviewed shows normal LFTs and CBC.  Continue tamoxifen until May 2024. - RTC 6 months for follow-up with repeat labs and mammogram.   Osteoporosis (DEXA on 04/21/2020 T -2.7): - Vitamin D is 46.  Continue calcium and vitamin D. - Continue Prolia every 6 months ordered through Dr. Nevada Crane.  3.  Smoking history: - CT chest (04/23/2021) was lung RADS 2.  Repeat prior to next visit in 6 months.  Breast Cancer therapy associated bone loss: I have recommended calcium, Vitamin D and weight bearing exercises.  Orders placed this encounter:  Orders Placed This Encounter  Procedures   MM 3D SCREEN BREAST BILATERAL   CT CHEST LUNG CA SCREEN LOW DOSE W/O CM    The patient has a good understanding of the overall plan. She agrees with it. She will call with any problems that may develop before the next visit here.  Derek Jack, MD [facility] [phone]

## 2021-11-01 ENCOUNTER — Other Ambulatory Visit: Payer: Self-pay | Admitting: *Deleted

## 2021-11-01 DIAGNOSIS — M810 Age-related osteoporosis without current pathological fracture: Secondary | ICD-10-CM

## 2021-11-01 DIAGNOSIS — D0512 Intraductal carcinoma in situ of left breast: Secondary | ICD-10-CM

## 2021-11-04 DIAGNOSIS — R7303 Prediabetes: Secondary | ICD-10-CM | POA: Diagnosis not present

## 2021-11-04 DIAGNOSIS — R52 Pain, unspecified: Secondary | ICD-10-CM | POA: Diagnosis not present

## 2021-11-04 DIAGNOSIS — E785 Hyperlipidemia, unspecified: Secondary | ICD-10-CM | POA: Diagnosis not present

## 2021-11-04 DIAGNOSIS — I700 Atherosclerosis of aorta: Secondary | ICD-10-CM | POA: Diagnosis not present

## 2021-11-04 DIAGNOSIS — Z6833 Body mass index (BMI) 33.0-33.9, adult: Secondary | ICD-10-CM | POA: Diagnosis not present

## 2021-11-04 DIAGNOSIS — Z23 Encounter for immunization: Secondary | ICD-10-CM | POA: Diagnosis not present

## 2021-11-04 DIAGNOSIS — E669 Obesity, unspecified: Secondary | ICD-10-CM | POA: Diagnosis not present

## 2021-11-04 DIAGNOSIS — Z716 Tobacco abuse counseling: Secondary | ICD-10-CM | POA: Diagnosis not present

## 2021-11-04 DIAGNOSIS — E041 Nontoxic single thyroid nodule: Secondary | ICD-10-CM | POA: Diagnosis not present

## 2021-11-04 DIAGNOSIS — Z853 Personal history of malignant neoplasm of breast: Secondary | ICD-10-CM | POA: Diagnosis not present

## 2021-11-04 DIAGNOSIS — F17200 Nicotine dependence, unspecified, uncomplicated: Secondary | ICD-10-CM | POA: Diagnosis not present

## 2021-11-04 DIAGNOSIS — J439 Emphysema, unspecified: Secondary | ICD-10-CM | POA: Diagnosis not present

## 2021-11-06 ENCOUNTER — Ambulatory Visit: Payer: Self-pay | Admitting: Otolaryngology

## 2021-11-06 NOTE — Pre-Procedure Instructions (Signed)
Surgical Instructions    Your procedure is scheduled on Friday, October 27th.  Report to [facility] Main Entrance "A" at 06:45 A.M., then check in with the Admitting office.  Call this number if you have problems the morning of surgery:  [phone]   If you have any questions prior to your surgery date call [phone]: Open Monday-Friday 8am-4pm    Remember:  Do not eat or drink after midnight the night before your surgery    Take these medicines the morning of surgery with A SIP OF WATER  ALPRAZolam (XANAX)  escitalopram (LEXAPRO)  tamoxifen (NOLVADEX)  Varenicline Tartrate (CHANTIX)  If needed: acetaminophen (TYLENOL)  As of today, STOP taking any Aspirin (unless otherwise instructed by your surgeon) Aleve, Naproxen, Ibuprofen, Motrin, Advil, Goody's, BC's, all herbal medications, fish oil, and all vitamins.                     Do NOT Smoke (Tobacco/Vaping) for 24 hours prior to your procedure.  If you use a CPAP at night, you may bring your mask/headgear for your overnight stay.   Contacts, glasses, piercing's, hearing aid's, dentures or partials may not be worn into surgery, please bring cases for these belongings.    For patients admitted to the hospital, discharge time will be determined by your treatment team.   Patients discharged the day of surgery will not be allowed to drive home, and someone needs to stay with them for 24 hours.  SURGICAL WAITING ROOM VISITATION Patients having surgery or a procedure may have no more than 2 support people in the waiting area - these visitors may rotate.   Children under the age of 80 must have an adult with them who is not the patient. If the patient needs to stay at the hospital during part of their recovery, the visitor guidelines for inpatient rooms apply. Pre-op nurse will coordinate an appropriate time for 1 support person to accompany patient in pre-op.  This support person may not rotate.   Please refer to the  [facility] website for the visitor guidelines for Inpatients (after your surgery is over and you are in a regular room).    Special instructions:   [facility]- Preparing For Surgery  Before surgery, you can play an important role. Because skin is not sterile, your skin needs to be as free of germs as possible. You can reduce the number of germs on your skin by washing with CHG (chlorahexidine gluconate) Soap before surgery.  CHG is an antiseptic cleaner which kills germs and bonds with the skin to continue killing germs even after washing.    Oral Hygiene is also important to reduce your risk of infection.  Remember - BRUSH YOUR TEETH THE MORNING OF SURGERY WITH YOUR REGULAR TOOTHPASTE  Please do not use if you have an allergy to CHG or antibacterial soaps. If your skin becomes reddened/irritated stop using the CHG.  Do not shave (including legs and underarms) for at least 48 hours prior to first CHG shower. It is OK to shave your face.  Please follow these instructions carefully.   Shower the NIGHT BEFORE SURGERY and the MORNING OF SURGERY  If you chose to wash your hair, wash your hair first as usual with your normal shampoo.  After you shampoo, rinse your hair and body thoroughly to remove the shampoo.  Use CHG Soap as you would any other liquid soap. You can apply CHG directly to the skin and wash gently with a  scrungie or a clean washcloth.   Apply the CHG Soap to your body ONLY FROM THE NECK DOWN.  Do not use on open wounds or open sores. Avoid contact with your eyes, ears, mouth and genitals (private parts). Wash Face and genitals (private parts)  with your normal soap.   Wash thoroughly, paying special attention to the area where your surgery will be performed.  Thoroughly rinse your body with warm water from the neck down.  DO NOT shower/wash with your normal soap after using and rinsing off the CHG Soap.  Pat yourself dry with a CLEAN TOWEL.  Wear CLEAN PAJAMAS to bed the  night before surgery  Place CLEAN SHEETS on your bed the night before your surgery  DO NOT SLEEP WITH PETS.   Day of Surgery: Take a shower with CHG soap. Do not wear jewelry or makeup Do not wear lotions, powders, perfumes, or deodorant. Do not shave 48 hours prior to surgery.   Do not bring valuables to the hospital. [facility] is not responsible for any belongings or valuables. Do not wear nail polish, gel polish, artificial nails, or any other type of covering on natural nails (fingers and toes) If you have artificial nails or gel coating that need to be removed by a nail salon, please have this removed prior to surgery. Artificial nails or gel coating may interfere with anesthesia's ability to adequately monitor your vital signs. Wear Clean/Comfortable clothing the morning of surgery Remember to brush your teeth WITH YOUR REGULAR TOOTHPASTE.   Please read over the following fact sheets that you were given.    If you received a COVID test during your pre-op visit  it is requested that you wear a mask when out in public, stay away from anyone that may not be feeling well and notify your surgeon if you develop symptoms. If you have been in contact with anyone that has tested positive in the last 10 days please notify you surgeon.

## 2021-11-06 NOTE — H&P (View-Only) (Signed)
HPI:   Melissa Weaver is a 71 y.o. female who presents as a consult Patient.   Referring Provider: Cecile Sheerer, NP   Chief complaint: Neck mass.  HPI: 106-monthhistory of a neck mass on the right side slowly getting larger. She had a CT scan recently. She has no history of skin cancer in the head neck area. She is a smoker unfortunately.  PMH/Meds/All/SocHx/FamHx/ROS:   History reviewed. No pertinent past medical history.  History reviewed. No pertinent surgical history.  No family history of bleeding disorders, wound healing problems or difficulty with anesthesia.   Social History   Socioeconomic History   Marital status: Not on file  Spouse name: Not on file   Number of children: Not on file   Years of education: Not on file   Highest education level: Not on file  Occupational History   Not on file  Tobacco Use   Smoking status: Never  Passive exposure: Never   Smokeless tobacco: Never  Vaping Use   Vaping Use: Never used  Substance and Sexual Activity   Alcohol use: Yes   Drug use: Never   Sexual activity: Never  Other Topics Concern   Not on file  Social History Narrative   Not on file   Social Determinants of Health   Financial Resource Strain: Not on file  Food Insecurity: Not on file  Transportation Needs: Not on file  Physical Activity: Not on file  Stress: Not on file  Social Connections: Not on file  Housing Stability: Not on file   Current Outpatient Medications:   acetaminophen (TYLENOL) 650 MG CR tablet, Take 2 tablets (1,300 mg total) by mouth., Disp: , Rfl:   ALPRAZolam (XANAX) 0.25 MG tablet, Take 1 tablet (0.25 mg total) by mouth 3 (three) times daily as needed., Disp: , Rfl:   atorvastatin (LIPITOR) 10 MG tablet, TAKE ONE TABLET BY MOUTH EVERY EVENING with dinner, Disp: , Rfl:   cholecalciferol (VITAMIN D3) 1000 UNIT Tab, Take 5 tablets (5,000 Units total) by mouth daily., Disp: , Rfl:   denosumab (PROLIA) 60 mg/mL Syrg, Inject 1 mL (60  mg total) into the skin., Disp: , Rfl:   escitalopram oxalate (LEXAPRO) 10 MG tablet, Take 1 tablet (10 mg total) by mouth daily with breakfast., Disp: , Rfl:   lisinopriL (PRINIVIL,ZESTRIL) 10 MG tablet, Take 1 tablet (10 mg total) by mouth., Disp: , Rfl:   magnesium oxide (MAG-OX) 400 mg (241.3 mg magnesium) tablet, Take 1 tablet (400 mg total) by mouth., Disp: , Rfl:   tamoxifen (NOLVADEX) 20 MG tablet, Take 1 tablet (20 mg total) by mouth daily., Disp: , Rfl:   A complete ROS was performed with pertinent positives/negatives noted in the HPI. The remainder of the ROS are negative.   Physical Exam:   Pulse 93  Temp 97.6 F (36.4 C) (Temporal)  Resp 18  Ht 1.594 m (5' 2.75")  Wt 82.2 kg (181 lb 3.2 oz)  SpO2 96%  BMI 32.35 kg/m   General: Healthy and alert, in no distress, breathing easily. Normal affect. In a pleasant mood. Head: Normocephalic, atraumatic. No masses, or scars. Eyes: Pupils are equal, and reactive to light. Vision is grossly intact. No spontaneous or gaze nystagmus. Ears: Ear canals are clear. Tympanic membranes are intact, with normal landmarks and the middle ears are clear and healthy. Hearing: Grossly normal. Nose: Nasal cavities are clear with healthy mucosa, no polyps or exudate. Airways are patent. Face: No masses or scars, facial nerve function  is symmetric. Oral Cavity: No mucosal abnormalities are noted. Tongue with normal mobility. Dentition appears healthy. Oropharynx: Tonsils are symmetric. There are no mucosal masses identified. Tongue base appears normal and healthy. Larynx/Hypopharynx: indirect exam reveals healthy, mobile vocal cords, without mucosal lesions in the hypopharynx or larynx. Chest: Deferred Neck: There is a 2 or 3 cm soft mass palpable either adjacent to or possibly in the tail of the parotid on the right. No other masses or cervical adenopathy, no thyroid nodules or enlargement. Neuro: Cranial nerves II-XII with normal  function. Balance: Normal gate. Other findings: none.  Independent Review of Additional Tests or Records:  CT neck:  IMPRESSION:  1. 1.0 cm x 1.3 cm centrally hypodense lymph node inferior to the  right parotid gland is suspicious for necrotic/metastatic  adenopathy. No primary malignancy is identified. Recommend ENT  referral and tissue sampling.  2. No other pathologic or necrotic lymphadenopathy in the neck.  3. Emphysema.   Procedures:  Procedure Note:  Indications for procedure: neck mass  Details of the procedure were discussed with the patient and all questions were answered.  Procedure:  2% xylocaine with epinephrine was infiltrated into the overlying skin. First pass was made with a 25 gauge needle and 10 cc syringe. Second pass was made with a 22 gauge needle. Specimen was placed on microscopic slides and air-dried. Additional material was placed in Cytolyte solution for cell block preparation. A third pass was made with a 22 guage needle and sample was added to the cytolyte solution.  A bandage was applied.   She tolerated the procedure well. Results will be discussed when available.  Impression & Plans:  Right neck or possibly parotid mass. FNA performed today. No other abnormalities identified in the head neck. We will discuss results when available.

## 2021-11-06 NOTE — H&P (Signed)
HPI:   Melissa Weaver is a 71 y.o. female who presents as a consult Patient.   Referring Provider: Cecile Sheerer, NP   Chief complaint: Neck mass.  HPI: 7-monthhistory of a neck mass on the right side slowly getting larger. She had a CT scan recently. She has no history of skin cancer in the head neck area. She is a smoker unfortunately.  PMH/Meds/All/SocHx/FamHx/ROS:   History reviewed. No pertinent past medical history.  History reviewed. No pertinent surgical history.  No family history of bleeding disorders, wound healing problems or difficulty with anesthesia.   Social History   Socioeconomic History   Marital status: Not on file  Spouse name: Not on file   Number of children: Not on file   Years of education: Not on file   Highest education level: Not on file  Occupational History   Not on file  Tobacco Use   Smoking status: Never  Passive exposure: Never   Smokeless tobacco: Never  Vaping Use   Vaping Use: Never used  Substance and Sexual Activity   Alcohol use: Yes   Drug use: Never   Sexual activity: Never  Other Topics Concern   Not on file  Social History Narrative   Not on file   Social Determinants of Health   Financial Resource Strain: Not on file  Food Insecurity: Not on file  Transportation Needs: Not on file  Physical Activity: Not on file  Stress: Not on file  Social Connections: Not on file  Housing Stability: Not on file   Current Outpatient Medications:   acetaminophen (TYLENOL) 650 MG CR tablet, Take 2 tablets (1,300 mg total) by mouth., Disp: , Rfl:   ALPRAZolam (XANAX) 0.25 MG tablet, Take 1 tablet (0.25 mg total) by mouth 3 (three) times daily as needed., Disp: , Rfl:   atorvastatin (LIPITOR) 10 MG tablet, TAKE ONE TABLET BY MOUTH EVERY EVENING with dinner, Disp: , Rfl:   cholecalciferol (VITAMIN D3) 1000 UNIT Tab, Take 5 tablets (5,000 Units total) by mouth daily., Disp: , Rfl:   denosumab (PROLIA) 60 mg/mL Syrg, Inject 1 mL (60  mg total) into the skin., Disp: , Rfl:   escitalopram oxalate (LEXAPRO) 10 MG tablet, Take 1 tablet (10 mg total) by mouth daily with breakfast., Disp: , Rfl:   lisinopriL (PRINIVIL,ZESTRIL) 10 MG tablet, Take 1 tablet (10 mg total) by mouth., Disp: , Rfl:   magnesium oxide (MAG-OX) 400 mg (241.3 mg magnesium) tablet, Take 1 tablet (400 mg total) by mouth., Disp: , Rfl:   tamoxifen (NOLVADEX) 20 MG tablet, Take 1 tablet (20 mg total) by mouth daily., Disp: , Rfl:   A complete ROS was performed with pertinent positives/negatives noted in the HPI. The remainder of the ROS are negative.   Physical Exam:   Pulse 93  Temp 97.6 F (36.4 C) (Temporal)  Resp 18  Ht 1.594 m (5' 2.75")  Wt 82.2 kg (181 lb 3.2 oz)  SpO2 96%  BMI 32.35 kg/m   General: Healthy and alert, in no distress, breathing easily. Normal affect. In a pleasant mood. Head: Normocephalic, atraumatic. No masses, or scars. Eyes: Pupils are equal, and reactive to light. Vision is grossly intact. No spontaneous or gaze nystagmus. Ears: Ear canals are clear. Tympanic membranes are intact, with normal landmarks and the middle ears are clear and healthy. Hearing: Grossly normal. Nose: Nasal cavities are clear with healthy mucosa, no polyps or exudate. Airways are patent. Face: No masses or scars, facial nerve function  is symmetric. Oral Cavity: No mucosal abnormalities are noted. Tongue with normal mobility. Dentition appears healthy. Oropharynx: Tonsils are symmetric. There are no mucosal masses identified. Tongue base appears normal and healthy. Larynx/Hypopharynx: indirect exam reveals healthy, mobile vocal cords, without mucosal lesions in the hypopharynx or larynx. Chest: Deferred Neck: There is a 2 or 3 cm soft mass palpable either adjacent to or possibly in the tail of the parotid on the right. No other masses or cervical adenopathy, no thyroid nodules or enlargement. Neuro: Cranial nerves II-XII with normal  function. Balance: Normal gate. Other findings: none.  Independent Review of Additional Tests or Records:  CT neck:  IMPRESSION:  1. 1.0 cm x 1.3 cm centrally hypodense lymph node inferior to the  right parotid gland is suspicious for necrotic/metastatic  adenopathy. No primary malignancy is identified. Recommend ENT  referral and tissue sampling.  2. No other pathologic or necrotic lymphadenopathy in the neck.  3. Emphysema.   Procedures:  Procedure Note:  Indications for procedure: neck mass  Details of the procedure were discussed with the patient and all questions were answered.  Procedure:  2% xylocaine with epinephrine was infiltrated into the overlying skin. First pass was made with a 25 gauge needle and 10 cc syringe. Second pass was made with a 22 gauge needle. Specimen was placed on microscopic slides and air-dried. Additional material was placed in Cytolyte solution for cell block preparation. A third pass was made with a 22 guage needle and sample was added to the cytolyte solution.  A bandage was applied.   She tolerated the procedure well. Results will be discussed when available.  Impression & Plans:  Right neck or possibly parotid mass. FNA performed today. No other abnormalities identified in the head neck. We will discuss results when available.

## 2021-11-09 ENCOUNTER — Other Ambulatory Visit: Payer: Self-pay

## 2021-11-09 ENCOUNTER — Encounter (HOSPITAL_COMMUNITY)
Admission: RE | Admit: 2021-11-09 | Discharge: 2021-11-09 | Disposition: A | Discharge status: Home / Self Care | Payer: PPO | Source: Ambulatory Visit | Attending: Otolaryngology | Admitting: Otolaryngology

## 2021-11-09 ENCOUNTER — Encounter (HOSPITAL_COMMUNITY): Payer: Self-pay

## 2021-11-09 VITALS — BP 121/60 | HR 83 | Temp 98.0°F | Resp 17 | Ht 62.0 in | Wt 183.0 lb

## 2021-11-09 DIAGNOSIS — Z01810 Encounter for preprocedural cardiovascular examination: Secondary | ICD-10-CM | POA: Diagnosis not present

## 2021-11-09 DIAGNOSIS — I10 Essential (primary) hypertension: Secondary | ICD-10-CM | POA: Diagnosis not present

## 2021-11-09 HISTORY — DX: Family history of other specified conditions: Z84.89

## 2021-11-09 HISTORY — DX: Other specified postprocedural states: Z98.890

## 2021-11-09 HISTORY — DX: Nausea with vomiting, unspecified: R11.2

## 2021-11-09 HISTORY — DX: Other complications of anesthesia, initial encounter: T88.59XA

## 2021-11-09 HISTORY — DX: Gastro-esophageal reflux disease without esophagitis: K21.9

## 2021-11-09 NOTE — Progress Notes (Signed)
PCP - Dr. Edwinna Areola. Hall Cardiologist - denies  PPM/ICD - denies   Chest x-ray - denies EKG - 11/09/21 Stress Test - denies ECHO - denies Cardiac Cath - denies  Sleep Study - denies   DM- denies  Last dose of GLP1 agonist-  n/a   ASA/Blood Thinner Instructions: n/a   ERAS Protcol - no, NPO   COVID TEST- n/a   Anesthesia review: no  Patient denies shortness of breath, fever, cough and chest pain at PAT appointment   All instructions explained to the patient, with a verbal understanding of the material. Patient agrees to go over the instructions while at home for a better understanding. The opportunity to ask questions was provided.

## 2021-11-13 ENCOUNTER — Observation Stay (HOSPITAL_COMMUNITY)
Admission: RE | Admit: 2021-11-13 | Discharge: 2021-11-14 | Disposition: A | Discharge status: Home / Self Care | Payer: PPO | Attending: Otolaryngology | Admitting: Otolaryngology

## 2021-11-13 ENCOUNTER — Encounter (HOSPITAL_COMMUNITY): Payer: Self-pay | Admitting: Otolaryngology

## 2021-11-13 ENCOUNTER — Ambulatory Visit (HOSPITAL_COMMUNITY): Payer: PPO | Admitting: Certified Registered Nurse Anesthetist

## 2021-11-13 ENCOUNTER — Encounter (HOSPITAL_COMMUNITY)
Admission: RE | Disposition: A | Discharge status: Home / Self Care | Payer: Self-pay | Source: Home / Self Care | Attending: Otolaryngology

## 2021-11-13 ENCOUNTER — Other Ambulatory Visit: Payer: Self-pay

## 2021-11-13 ENCOUNTER — Ambulatory Visit (HOSPITAL_BASED_OUTPATIENT_CLINIC_OR_DEPARTMENT_OTHER): Payer: PPO | Admitting: Certified Registered Nurse Anesthetist

## 2021-11-13 DIAGNOSIS — F17200 Nicotine dependence, unspecified, uncomplicated: Secondary | ICD-10-CM | POA: Diagnosis not present

## 2021-11-13 DIAGNOSIS — K118 Other diseases of salivary glands: Secondary | ICD-10-CM

## 2021-11-13 DIAGNOSIS — D110 Benign neoplasm of parotid gland: Secondary | ICD-10-CM | POA: Diagnosis not present

## 2021-11-13 HISTORY — PX: PAROTIDECTOMY: SHX2163

## 2021-11-13 SURGERY — EXCISION, PAROTID GLAND
Anesthesia: General | Site: Neck | Laterality: Right

## 2021-11-13 MED ORDER — ONDANSETRON HCL 4 MG/2ML IJ SOLN: 4.0000 mg | INTRAMUSCULAR | Status: DC | PRN

## 2021-11-13 MED ORDER — ONDANSETRON HCL 4 MG PO TABS: 4.0000 mg | ORAL_TABLET | ORAL | Status: DC | PRN

## 2021-11-13 MED ORDER — CHLORHEXIDINE GLUCONATE 0.12 % MT SOLN
OROMUCOSAL | Status: AC
  Administered 2021-11-13: 15 mL via OROMUCOSAL
  Filled 2021-11-13: qty 15

## 2021-11-13 MED ORDER — BACITRACIN ZINC 500 UNIT/GM EX OINT
TOPICAL_OINTMENT | CUTANEOUS | Status: AC
  Filled 2021-11-13: qty 28.35

## 2021-11-13 MED ORDER — POLYVINYL ALCOHOL 1.4 % OP SOLN: 1.0000 [drp] | OPHTHALMIC | Status: DC | PRN

## 2021-11-13 MED ORDER — ANTI MONKEY BUTT EX POWD
1.0000 | Freq: Every day | CUTANEOUS | Status: DC | PRN
  Filled 2021-11-13: qty 1

## 2021-11-13 MED ORDER — FERROUS SULFATE 325 (65 FE) MG PO TABS: 325.0000 mg | ORAL_TABLET | ORAL | Status: DC

## 2021-11-13 MED ORDER — FENTANYL CITRATE (PF) 250 MCG/5ML IJ SOLN
INTRAMUSCULAR | Status: DC | PRN
  Administered 2021-11-13: 100 ug via INTRAVENOUS
  Administered 2021-11-13: 50 ug via INTRAVENOUS
  Administered 2021-11-13: 25 ug via INTRAVENOUS

## 2021-11-13 MED ORDER — 0.9 % SODIUM CHLORIDE (POUR BTL) OPTIME
TOPICAL | Status: DC | PRN
  Administered 2021-11-13: 1000 mL

## 2021-11-13 MED ORDER — DEXAMETHASONE SODIUM PHOSPHATE 10 MG/ML IJ SOLN
INTRAMUSCULAR | Status: AC
  Filled 2021-11-13: qty 1

## 2021-11-13 MED ORDER — KETOROLAC TROMETHAMINE 15 MG/ML IJ SOLN: 15.0000 mg | Freq: Once | INTRAMUSCULAR | Status: DC | PRN

## 2021-11-13 MED ORDER — ATORVASTATIN CALCIUM 10 MG PO TABS
10.0000 mg | ORAL_TABLET | Freq: Every evening | ORAL | Status: DC
  Administered 2021-11-13: 10 mg via ORAL
  Filled 2021-11-13: qty 1

## 2021-11-13 MED ORDER — SUCCINYLCHOLINE CHLORIDE 200 MG/10ML IV SOSY
PREFILLED_SYRINGE | INTRAVENOUS | Status: AC
  Filled 2021-11-13: qty 10

## 2021-11-13 MED ORDER — PHENYLEPHRINE HCL-NACL 20-0.9 MG/250ML-% IV SOLN
INTRAVENOUS | Status: DC | PRN
  Administered 2021-11-13: 40 ug/min via INTRAVENOUS

## 2021-11-13 MED ORDER — LIDOCAINE-EPINEPHRINE 1 %-1:100000 IJ SOLN
INTRAMUSCULAR | Status: AC
  Filled 2021-11-13: qty 1

## 2021-11-13 MED ORDER — ESCITALOPRAM OXALATE 10 MG PO TABS
10.0000 mg | ORAL_TABLET | Freq: Every morning | ORAL | Status: DC
  Administered 2021-11-14: 10 mg via ORAL
  Filled 2021-11-13: qty 1

## 2021-11-13 MED ORDER — AMISULPRIDE (ANTIEMETIC) 5 MG/2ML IV SOLN: 10.0000 mg | Freq: Once | INTRAVENOUS | Status: DC | PRN

## 2021-11-13 MED ORDER — KETAMINE HCL 50 MG/5ML IJ SOSY
PREFILLED_SYRINGE | INTRAMUSCULAR | Status: AC
  Filled 2021-11-13: qty 5

## 2021-11-13 MED ORDER — SUCCINYLCHOLINE CHLORIDE 200 MG/10ML IV SOSY
PREFILLED_SYRINGE | INTRAVENOUS | Status: DC | PRN
  Administered 2021-11-13: 80 mg via INTRAVENOUS

## 2021-11-13 MED ORDER — PHENYLEPHRINE 80 MCG/ML (10ML) SYRINGE FOR IV PUSH (FOR BLOOD PRESSURE SUPPORT)
PREFILLED_SYRINGE | INTRAVENOUS | Status: AC
  Filled 2021-11-13: qty 10

## 2021-11-13 MED ORDER — HYPROMELLOSE (GONIOSCOPIC) 2.5 % OP SOLN: 1.0000 [drp] | OPHTHALMIC | 12 refills | Status: DC | PRN

## 2021-11-13 MED ORDER — VITAMIN D3 25 MCG (1000 UNIT) PO TABS
2000.0000 [IU] | ORAL_TABLET | Freq: Every morning | ORAL | Status: DC
  Administered 2021-11-14: 2000 [IU] via ORAL
  Filled 2021-11-13 (×2): qty 2

## 2021-11-13 MED ORDER — MAGNESIUM OXIDE -MG SUPPLEMENT 400 (240 MG) MG PO TABS
200.0000 mg | ORAL_TABLET | Freq: Every evening | ORAL | Status: DC
  Administered 2021-11-13: 200 mg via ORAL
  Filled 2021-11-13: qty 1

## 2021-11-13 MED ORDER — DEXTROSE-NACL 5-0.9 % IV SOLN: INTRAVENOUS | Status: DC

## 2021-11-13 MED ORDER — LACTATED RINGERS IV SOLN: INTRAVENOUS | Status: DC

## 2021-11-13 MED ORDER — ONDANSETRON HCL 4 MG/2ML IJ SOLN
INTRAMUSCULAR | Status: DC | PRN
  Administered 2021-11-13: 4 mg via INTRAVENOUS

## 2021-11-13 MED ORDER — HYDROCODONE-ACETAMINOPHEN 5-325 MG PO TABS
1.0000 | ORAL_TABLET | ORAL | Status: DC | PRN
  Administered 2021-11-13: 1 via ORAL
  Filled 2021-11-13: qty 1

## 2021-11-13 MED ORDER — CHLORHEXIDINE GLUCONATE 0.12 % MT SOLN: 15.0000 mL | Freq: Once | OROMUCOSAL | Status: AC

## 2021-11-13 MED ORDER — ORAL CARE MOUTH RINSE: 15.0000 mL | Freq: Once | OROMUCOSAL | Status: AC

## 2021-11-13 MED ORDER — PROPOFOL 10 MG/ML IV BOLUS
INTRAVENOUS | Status: DC | PRN
  Administered 2021-11-13: 50 mg via INTRAVENOUS
  Administered 2021-11-13: 150 mg via INTRAVENOUS
  Administered 2021-11-13: 100 mg via INTRAVENOUS
  Administered 2021-11-13: 50 mg via INTRAVENOUS

## 2021-11-13 MED ORDER — KETAMINE HCL 10 MG/ML IJ SOLN
INTRAMUSCULAR | Status: DC | PRN
  Administered 2021-11-13: 5 mg via INTRAVENOUS

## 2021-11-13 MED ORDER — DEXAMETHASONE SODIUM PHOSPHATE 10 MG/ML IJ SOLN
INTRAMUSCULAR | Status: DC | PRN
  Administered 2021-11-13: 10 mg via INTRAVENOUS

## 2021-11-13 MED ORDER — IBUPROFEN 100 MG/5ML PO SUSP: 400.0000 mg | Freq: Four times a day (QID) | ORAL | Status: DC | PRN

## 2021-11-13 MED ORDER — FENTANYL CITRATE (PF) 250 MCG/5ML IJ SOLN
INTRAMUSCULAR | Status: AC
  Filled 2021-11-13: qty 5

## 2021-11-13 MED ORDER — HYDROCODONE-ACETAMINOPHEN 5-325 MG PO TABS: 1.0000 | ORAL_TABLET | Freq: Four times a day (QID) | ORAL | 0 refills | Status: DC | PRN

## 2021-11-13 MED ORDER — ACETAMINOPHEN 500 MG PO TABS
1000.0000 mg | ORAL_TABLET | Freq: Once | ORAL | Status: AC
  Administered 2021-11-13: 1000 mg via ORAL
  Filled 2021-11-13: qty 2

## 2021-11-13 MED ORDER — OMEGA-3-ACID ETHYL ESTERS 1 G PO CAPS
1.0000 g | ORAL_CAPSULE | Freq: Two times a day (BID) | ORAL | Status: DC
  Administered 2021-11-13 (×2): 1 g via ORAL
  Filled 2021-11-13 (×2): qty 1

## 2021-11-13 MED ORDER — ONDANSETRON HCL 4 MG/2ML IJ SOLN
INTRAMUSCULAR | Status: AC
  Filled 2021-11-13: qty 2

## 2021-11-13 MED ORDER — LIDOCAINE 2% (20 MG/ML) 5 ML SYRINGE
INTRAMUSCULAR | Status: AC
  Filled 2021-11-13: qty 5

## 2021-11-13 MED ORDER — ONDANSETRON HCL 4 MG/2ML IJ SOLN: 4.0000 mg | Freq: Once | INTRAMUSCULAR | Status: DC | PRN

## 2021-11-13 MED ORDER — LIDOCAINE 2% (20 MG/ML) 5 ML SYRINGE
INTRAMUSCULAR | Status: DC | PRN
  Administered 2021-11-13: 60 mg via INTRAVENOUS

## 2021-11-13 MED ORDER — FENTANYL CITRATE (PF) 100 MCG/2ML IJ SOLN: 25.0000 ug | INTRAMUSCULAR | Status: DC | PRN

## 2021-11-13 MED ORDER — TAMOXIFEN CITRATE 10 MG PO TABS
20.0000 mg | ORAL_TABLET | Freq: Every day | ORAL | Status: DC
  Filled 2021-11-13: qty 2

## 2021-11-13 MED ORDER — DEXMEDETOMIDINE HCL IN NACL 80 MCG/20ML IV SOLN
INTRAVENOUS | Status: AC
  Filled 2021-11-13: qty 20

## 2021-11-13 MED ORDER — ARTIFICIAL TEARS OPHTHALMIC OINT
TOPICAL_OINTMENT | Freq: Every day | OPHTHALMIC | Status: DC
  Filled 2021-11-13: qty 3.5

## 2021-11-13 MED ORDER — ALPRAZOLAM 0.25 MG PO TABS: 0.2500 mg | ORAL_TABLET | Freq: Every day | ORAL | Status: DC

## 2021-11-13 MED ORDER — PROPOFOL 10 MG/ML IV BOLUS
INTRAVENOUS | Status: AC
  Filled 2021-11-13: qty 20

## 2021-11-13 MED ORDER — ACETAMINOPHEN 325 MG PO TABS
1000.0000 mg | ORAL_TABLET | Freq: Every day | ORAL | Status: DC | PRN
  Administered 2021-11-13: 975 mg via ORAL
  Filled 2021-11-13: qty 3

## 2021-11-13 MED ORDER — PROPOFOL 500 MG/50ML IV EMUL
INTRAVENOUS | Status: DC | PRN
  Administered 2021-11-13: 50 ug/kg/min via INTRAVENOUS

## 2021-11-13 SURGICAL SUPPLY — 50 items
ATTRACTOMAT 16X20 MAGNETIC DRP (DRAPES) IMPLANT
BAG COUNTER SPONGE SURGICOUNT (BAG) ×1 IMPLANT
BLADE CLIPPER SURG (BLADE) IMPLANT
BLADE SURG 15 STRL LF DISP TIS (BLADE) ×1 IMPLANT
BLADE SURG 15 STRL SS (BLADE) ×1
CANISTER SUCT 3000ML PPV (MISCELLANEOUS) ×1 IMPLANT
CLEANER TIP ELECTROSURG 2X2 (MISCELLANEOUS) ×1 IMPLANT
CNTNR URN SCR LID CUP LEK RST (MISCELLANEOUS) IMPLANT
CONT SPEC 4OZ STRL OR WHT (MISCELLANEOUS)
CORD BIPOLAR FORCEPS 12FT (ELECTRODE) ×1 IMPLANT
COVER SURGICAL LIGHT HANDLE (MISCELLANEOUS) ×1 IMPLANT
DERMABOND ADVANCED .7 DNX12 (GAUZE/BANDAGES/DRESSINGS) ×1 IMPLANT
DRAIN HEMOVAC 7FR (DRAIN) IMPLANT
DRAIN JACKSON RD 7FR 3/32 (WOUND CARE) IMPLANT
DRAIN SNY 10 ROU (WOUND CARE) IMPLANT
DRAIN WOUND SNY 15 RND (WOUND CARE) IMPLANT
DRAPE HALF SHEET 40X57 (DRAPES) IMPLANT
DRAPE INCISE 23X17 IOBAN STRL (DRAPES) ×1
DRAPE INCISE 23X17 STRL (DRAPES) ×1 IMPLANT
DRAPE INCISE IOBAN 23X17 STRL (DRAPES) ×1 IMPLANT
ELECT COATED BLADE 2.86 ST (ELECTRODE) ×1 IMPLANT
ELECT REM PT RETURN 9FT ADLT (ELECTROSURGICAL) ×1
ELECTRODE REM PT RTRN 9FT ADLT (ELECTROSURGICAL) ×1 IMPLANT
EVACUATOR SILICONE 100CC (DRAIN) IMPLANT
FORCEPS BIPOLAR SPETZLER 8 1.0 (NEUROSURGERY SUPPLIES) ×1 IMPLANT
GAUZE 4X4 16PLY ~~LOC~~+RFID DBL (SPONGE) ×1 IMPLANT
GLOVE ECLIPSE 7.5 STRL STRAW (GLOVE) ×1 IMPLANT
GOWN STRL REUS W/ TWL LRG LVL3 (GOWN DISPOSABLE) ×2 IMPLANT
GOWN STRL REUS W/TWL LRG LVL3 (GOWN DISPOSABLE) ×4
KIT BASIN OR (CUSTOM PROCEDURE TRAY) ×1 IMPLANT
KIT TURNOVER KIT B (KITS) ×1 IMPLANT
LOCATOR NERVE 3 VOLT (DISPOSABLE) IMPLANT
NDL PRECISIONGLIDE 27X1.5 (NEEDLE) IMPLANT
NEEDLE PRECISIONGLIDE 27X1.5 (NEEDLE) ×1 IMPLANT
NS IRRIG 1000ML POUR BTL (IV SOLUTION) ×1 IMPLANT
PAD ARMBOARD 7.5X6 YLW CONV (MISCELLANEOUS) ×2 IMPLANT
PENCIL FOOT CONTROL (ELECTRODE) ×1 IMPLANT
SHEARS HARMONIC 9CM CVD (BLADE) ×1 IMPLANT
STAPLER VISISTAT 35W (STAPLE) ×1 IMPLANT
SUT CHROMIC 3 0 SH 27 (SUTURE) ×1 IMPLANT
SUT CHROMIC 4 0 PS 2 18 (SUTURE) ×1 IMPLANT
SUT ETHILON 5 0 P 3 18 (SUTURE) ×1
SUT NYLON ETHILON 5-0 P-3 1X18 (SUTURE) IMPLANT
SUT SILK 2 0 SH CR/8 (SUTURE) ×1 IMPLANT
SUT SILK 4 0 REEL (SUTURE) ×1 IMPLANT
SUT VIC AB 3-0 SH 18 (SUTURE) ×1 IMPLANT
SYR CONTROL 10ML LL (SYRINGE) IMPLANT
TOWEL GREEN STERILE FF (TOWEL DISPOSABLE) ×1 IMPLANT
TRAY ENT MC OR (CUSTOM PROCEDURE TRAY) ×1 IMPLANT
TRAY FOLEY MTR SLVR 14FR STAT (SET/KITS/TRAYS/PACK) IMPLANT

## 2021-11-13 NOTE — Anesthesia Procedure Notes (Signed)
Procedure Name: Intubation Date/Time: 11/13/2021 8:57 AM  Performed by: Janene Harvey, CRNAPre-anesthesia Checklist: Patient identified, Emergency Drugs available, Suction available and Patient being monitored Patient Re-evaluated:Patient Re-evaluated prior to induction Oxygen Delivery Method: Circle system utilized Preoxygenation: Pre-oxygenation with 100% oxygen Induction Type: IV induction Ventilation: Mask ventilation without difficulty Laryngoscope Size: Mac and 4 Grade View: Grade I Tube type: Oral Tube size: 7.0 mm Number of attempts: 1 Airway Equipment and Method: Stylet Placement Confirmation: ETT inserted through vocal cords under direct vision, positive ETCO2 and breath sounds checked- equal and bilateral Secured at: 21 cm Tube secured with: Tape Dental Injury: Teeth and Oropharynx as per pre-operative assessment

## 2021-11-13 NOTE — Op Note (Signed)
OPERATIVE REPORT  DATE OF SURGERY: 11/13/2021  PATIENT:  Melissa Weaver,  71 y.o. female  PRE-OPERATIVE DIAGNOSIS:  Parotid mass  POST-OPERATIVE DIAGNOSIS:  Parotid mass  PROCEDURE:  Procedure(s): SUPERFICIAL PAROTIDECTOMY WITH FACIAL NERVE DISSECTION  SURGEON:  Beckie Salts, MD  ASSISTANTS: Jolene Provost PA  ANESTHESIA:   General   EBL: 30 ml  DRAINS: 7 French round JP  LOCAL MEDICATIONS USED:  None  SPECIMEN: Right parotid  COUNTS:  Correct  PROCEDURE DETAILS: The patient was taken to the operating room and placed on the operating table in the supine position. Following induction of general endotracheal anesthesia, the right side of the face was prepped and draped in a standard fashion. A preauricular incision was outlined with a marking pen with extension down around the mastoid process and 2 fingerbreadths below the angle of the mandible.  Electrocautery was used to incise the skin and subcutaneous tissue. The lateral branch of the greater auricular nerve was preserved. The parotid gland was dissected forward off of the upper sternocleidomastoid muscle and the digastric muscle was exposed posteriorly. The gland was also dissected off of the external auditory canal. Careful dissection superior to the digastric muscle and medial to the tympanomastoid suture line revealed the main trunk of the facial nerve. Using a McCabe dissector the main trunk was dissected out towards the pes anserinus. The upper and lower divisions were then dissected identifying all branches.  The harmonic scalpel was used to divide the parotid tissue. Bipolar cautery was used as needed for completion of hemostasis. The tumor was identified in the inferior part of the gland. The glandular tissue with the accompanying tumor was dissected free and removed, sent for pathologic evaluation. The wound was irrigated with saline and hemostasis was completed as needed.  The drain was exited through a separate  stab incision and secured in place with nylon suture. A running subcuticular chromic closure was accomplished and Dermabond was used on the skin. The drain was charged. She was awakened, extubated and transferred to recovery in stable condition.    PATIENT DISPOSITION:  To PACU, stable

## 2021-11-13 NOTE — Progress Notes (Signed)
Patient doing well when came up to floor, pain was 2-3/10. Surgical sight looked good, no issues. PACU emptied drain prior to bringing patient up. Patient ambulating and went to restroom. No issues today.

## 2021-11-13 NOTE — Interval H&P Note (Signed)
History and Physical Interval Note:  11/13/2021 7:19 AM  Melissa Weaver  has presented today for surgery, with the diagnosis of Parotid mass.  The various methods of treatment have been discussed with the patient and family. After consideration of risks, benefits and other options for treatment, the patient has consented to  Procedure(s): [place] (Right) as a surgical intervention.  The patient's history has been reviewed, patient examined, no change in status, stable for surgery.  I have reviewed the patient's chart and labs.  Questions were answered to the patient's satisfaction.     [name]

## 2021-11-13 NOTE — Discharge Instructions (Signed)
Neck Mass Excision Post Operative Instructions Office number [phone]  The Surgery [place] Parotidectomy involves general anesthesia, typically for 3-4 hours. Patients may be quite sedated for several hours after surgery and may remain sleepy for much of the day. Nausea and vomiting are occasionally seen, and usually resolve by the evening of surgery - even without additional medications. Most patients go home the same day as surgery.  Your Incision Your incision is closed with sutures. You can shower and wash your hair as usual the day after surgery. You may wash in a bathtub prior to that time if you are careful not to get your neck wet. Do not soak or scrub the incision. You might notice bruising around your incision and slight swelling above the scar when you are upright. You may have numbness of the skin around the incision. In addition, the scar may become pink and hard. This hardening will peak at about 3 weeks and may result in some tightness, which will disappear over the next 2 to 3 months. You should apply sunscreen on your incision site starting 1 month after surgery EVERY day for the first year after surgery. This will prevent a red or pink scar and give you the best cosmetic result for your scar. A daily moisturizer with sunscreen (example Oil of Olay with SPF 15) is fine.  Limitations You can start resuming normal activities as tolerated 7 days after surgery. For some patients, lifting can cause pain and stretching at the surgery site for up to 3 weeks after surgery. You should not drive or drink alcohol while taking pain medications. Most people can return to work/school 1 week after surgery, but there may be physical limitations as far as what you may do while at work. Your surgeon will review your specific limitations and release you when you are ready to return to work.  Medications ? Pain medication can be used for pain as prescribed. Pain is expected  after surgery. Your neck will be sore and pain will be worse when the neck is stretched. As the surgical site heals, pain will resolve over the course of a week. Pain medications can cause nausea, which can be prevented if you take them with food or milk. ? Bacitracin ointment can be applied to the incision once the tape is removed or the glue peels off. This prevents scabbing and itching. ? Take all of your routine medications as prescribed, unless told otherwise by your surgeon. Any medications that thin the blood should be discussed with your surgeon. ? IT IS OK TO TAKE OVER THE COUNTER PAIN MEDICATION (IBUPROFEN, NAPROXEN, or ACETAMINOPHEN) IN ADDITION TO YOUR PRESCRIBED MEDICATIONS. DO NOT TAKE ASPIRIN UNLESS CLEARED WITH YOUR SURGEON. ? Limit Acetaminophen/Tylenol to less than 4,'000mg'$ /day ? Limit Ibuprofen/Motrin to less than 3,'600mg'$ /day  Pain The main complaint following neck surgery is pain with swallowing and neck movement. Some people experience a dull ache, while others feel a sharp pain. This should not keep you from eating anything you want and will improve daily after surgery.  Reasons to call your surgeon's office ? Persistent fever over 101 F ? Bleeding from the neck incision ? Increasing neck swelling ? Pain that is not relieved by your medications ? Purulent drainage (pus) from the incision ? Redness surrounding the incision that is worsening or getting bigger ? Bleeding is possible after surgery and the most serious cases may cause trouble breathing. Symptoms include rapid swelling in the neck, trouble breathing, red and purple discoloration  of the skin over the incision. Please call doctor immediately or if trouble breathing is present, go to the closest emergency room or call 911.   Please come to clinic Monday 11/16/21 for JP Drain removal. On Monday morning at 0800 call our office [phone]

## 2021-11-13 NOTE — Anesthesia Preprocedure Evaluation (Addendum)
Anesthesia Evaluation  Patient identified by MRN, date of birth, ID band Patient awake    Reviewed: Allergy & Precautions, NPO status , Patient's Chart, lab work & pertinent test results  Airway Mallampati: II  TM Distance: >3 FB Neck ROM: Full    Dental  (+) Edentulous Upper, Missing   Pulmonary Current SmokerPatient did not abstain from smoking.   Pulmonary exam normal        Cardiovascular Normal cardiovascular exam     Neuro/Psych  PSYCHIATRIC DISORDERS Anxiety Depression       GI/Hepatic negative GI ROS, Neg liver ROS,,,  Endo/Other  negative endocrine ROS    Renal/GU negative Renal ROS     Musculoskeletal  (+) Arthritis ,    Abdominal   Peds  Hematology negative hematology ROS (+)   Anesthesia Other Findings Parotid mass  Reproductive/Obstetrics                             Anesthesia Physical Anesthesia Plan  ASA: 2  Anesthesia Plan: General   Post-op Pain Management:    Induction: Intravenous  PONV Risk Score and Plan: 2 and Ondansetron, Dexamethasone and Treatment may vary due to age or medical condition  Airway Management Planned: Oral ETT  Additional Equipment:   Intra-op Plan:   Post-operative Plan: Extubation in OR  Informed Consent: I have reviewed the patients History and Physical, chart, labs and discussed the procedure including the risks, benefits and alternatives for the proposed anesthesia with the patient or authorized representative who has indicated his/her understanding and acceptance.     Dental advisory given  Plan Discussed with: CRNA  Anesthesia Plan Comments:        Anesthesia Quick Evaluation

## 2021-11-13 NOTE — Progress Notes (Signed)
ENT POST-OP NOTE  ID: [id number]  Y/O F with right parotid mass POD#0 s/p right superficial parotidectomy (Dr. Constance Holster).  S: Doing well, minimal pain. Noting some facial droop. No dry eye.   O: Parotid incision c/d/I with suture in place. No facial edema/ecchymosis. JP holding bulb suction with s/s output.  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   Lacrilube and artificial tears ordered to prevent xerophthalmia Anticipate DC home in AM with JP drain   Electronically signed by:  SJenetta Downer MD  Staff Physician [facility] [place]

## 2021-11-13 NOTE — Progress Notes (Signed)
Educate patient how to drain fluid from JP. Verbalize understanding.

## 2021-11-13 NOTE — Anesthesia Postprocedure Evaluation (Signed)
Anesthesia Post Note  Patient: Melissa Weaver  Procedure(s) Performed: SUPERFICIAL PAROTIDECTOMY WITH FACIAL NERVE DISSECTION (Right: Neck)     Patient location during evaluation: PACU Anesthesia Type: General Level of consciousness: awake Pain management: pain level controlled Vital Signs Assessment: post-procedure vital signs reviewed and stable Respiratory status: spontaneous breathing, nonlabored ventilation, respiratory function stable and patient connected to nasal cannula oxygen Cardiovascular status: blood pressure returned to baseline and stable Postop Assessment: no apparent nausea or vomiting Anesthetic complications: no   No notable events documented.  Last Vitals:  Vitals:   11/13/21 1300 11/13/21 1332  BP: 139/82 124/67  Pulse: 94 93  Resp: 15 18  Temp:  36.7 C  SpO2: 96% 96%    Last Pain:  Vitals:   11/13/21 1332  TempSrc: Oral  PainSc:                  Teal Bontrager P Carney Saxton

## 2021-11-13 NOTE — Transfer of Care (Signed)
Immediate Anesthesia Transfer of Care Note  Patient: Melissa Weaver  Procedure(s) Performed: SUPERFICIAL PAROTIDECTOMY WITH FACIAL NERVE DISSECTION (Right: Neck)  Patient Location: PACU  Anesthesia Type:General  Level of Consciousness: awake, alert , and oriented  Airway & Oxygen Therapy: Patient Spontanous Breathing and Patient connected to face mask oxygen  Post-op Assessment: Report given to RN, Post -op Vital signs reviewed and stable, Patient moving all extremities X 4, and Patient able to stick tongue midline  Post vital signs: Reviewed  Last Vitals:  Vitals Value Taken Time  BP 139/82 11/13/21 1020  Temp 36.7 C 11/13/21 1020  Pulse 84 11/13/21 1021  Resp 18 11/13/21 1021  SpO2 96 % 11/13/21 1021  Vitals shown include unvalidated device data.  Last Pain:  Vitals:   11/13/21 0716  TempSrc: Oral  PainSc: 0-No pain         Complications: No notable events documented.

## 2021-11-14 ENCOUNTER — Encounter (HOSPITAL_COMMUNITY): Payer: Self-pay | Admitting: Otolaryngology

## 2021-11-14 DIAGNOSIS — D110 Benign neoplasm of parotid gland: Secondary | ICD-10-CM | POA: Diagnosis not present

## 2021-11-14 MED ORDER — HYPROMELLOSE (GONIOSCOPIC) 2.5 % OP SOLN: 1.0000 [drp] | OPHTHALMIC | 12 refills | Status: DC | PRN

## 2021-11-14 MED ORDER — ERYTHROMYCIN 5 MG/GM OP OINT: 1.0000 | TOPICAL_OINTMENT | Freq: Every day | OPHTHALMIC | 1 refills | Status: DC

## 2021-11-14 MED ORDER — HYDROCODONE-ACETAMINOPHEN 5-325 MG PO TABS: 1.0000 | ORAL_TABLET | Freq: Four times a day (QID) | ORAL | 0 refills | Status: AC | PRN

## 2021-11-14 MED ORDER — ERYTHROMYCIN 5 MG/GM OP OINT: 1.0000 | TOPICAL_OINTMENT | Freq: Every day | OPHTHALMIC | 1 refills | Status: AC

## 2021-11-14 NOTE — TOC Progression Note (Signed)
Transition of Care [facility]) - Progression Note    Patient Details  Name: Melissa Weaver MRN: [id number] Date of Birth: [birth year]  [facility] CM/SW Contact  Bartholomew Crews, RN Phone Number: [phone] 11/14/2021, 8:06 AM  Clinical Narrative:     Patient to transition home today. Chart reviewed. No TOC needs identified.       Expected Discharge Plan and Services           Expected Discharge Date: 11/14/21                                     Social Determinants of Health (SDOH) Interventions    Readmission Risk Interventions     No data to display

## 2021-11-14 NOTE — Progress Notes (Signed)
Melissa Weaver to be D/C'd  per MD order.  Discussed with the patient and all questions fully answered.  VSS, Skin clean, dry and intact without evidence of skin break down, no evidence of skin tears noted.  IV catheter discontinued intact. Site without signs and symptoms of complications. Dressing and pressure applied.  An After Visit Summary was printed and given to the patient. Patient received prescription.  D/c education completed with patient/family including follow up instructions, medication list, d/c activities limitations if indicated, with other d/c instructions as indicated by MD - patient able to verbalize understanding, all questions fully answered.   Patient instructed to return to ED, call 911, or call MD for any changes in condition.   Patient to be escorted via [place], and D/C home via private auto.

## 2021-11-14 NOTE — Plan of Care (Signed)
  Problem: Education: Goal: Knowledge of the prescribed therapeutic regimen will improve Outcome: Completed/Met   Problem: Activity: Goal: Ability to tolerate increased activity will improve Outcome: Completed/Met   Problem: Health Behavior/Discharge Planning: Goal: Identification of resources available to assist in meeting health care needs will improve Outcome: Completed/Met   Problem: Nutrition: Goal: Maintenance of adequate nutrition will improve Outcome: Completed/Met   Problem: Clinical Measurements: Goal: Complications related to the disease process, condition or treatment will be avoided or minimized Outcome: Completed/Met   Problem: Respiratory: Goal: Will regain and/or maintain adequate ventilation Outcome: Completed/Met   Problem: Skin Integrity: Goal: Demonstration of wound healing without infection will improve Outcome: Completed/Met   Problem: Education: Goal: Knowledge of General Education information will improve Description: Including pain rating scale, medication(s)/side effects and non-pharmacologic comfort measures Outcome: Completed/Met   Problem: Health Behavior/Discharge Planning: Goal: Ability to manage health-related needs will improve Outcome: Completed/Met   Problem: Clinical Measurements: Goal: Ability to maintain clinical measurements within normal limits will improve Outcome: Completed/Met Goal: Will remain free from infection Outcome: Completed/Met Goal: Diagnostic test results will improve Outcome: Completed/Met Goal: Respiratory complications will improve Outcome: Completed/Met Goal: Cardiovascular complication will be avoided Outcome: Completed/Met   Problem: Activity: Goal: Risk for activity intolerance will decrease Outcome: Completed/Met   Problem: Nutrition: Goal: Adequate nutrition will be maintained Outcome: Completed/Met   Problem: Coping: Goal: Level of anxiety will decrease Outcome: Completed/Met   Problem:  Elimination: Goal: Will not experience complications related to bowel motility Outcome: Completed/Met Goal: Will not experience complications related to urinary retention Outcome: Completed/Met   Problem: Pain Managment: Goal: General experience of comfort will improve Outcome: Completed/Met   Problem: Safety: Goal: Ability to remain free from injury will improve Outcome: Completed/Met   Problem: Skin Integrity: Goal: Risk for impaired skin integrity will decrease Outcome: Completed/Met   

## 2021-11-14 NOTE — Discharge Summary (Signed)
Physician Discharge Summary  Patient ID: Melissa Weaver MRN: [id number] DOB/AGE: 70-Apr-1952 70 y.o.  Admit date: 11/13/2021 Discharge date: 11/14/2021  Admission Diagnoses:  Principal Problem:   Parotid mass   Discharge Diagnoses:  Same  Surgeries: Procedure(s): [place] on 11/13/2021   Consultants: None  Discharged Condition: Improved  Hospital Course: Melissa Weaver is an 71 y.o. 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  They were brought to the operating room on 11/13/2021 and underwent the above named procedures.    Physical Exam:  General: Awake and alert, no acute distress Neck: Incision c/d/I. Wound bed without seroma/hematom. JP holding suction withs/so output. Respiratory: Respiratory effort is normal.  Nerve: Right FN HB VI.  Recent vital signs:  Vitals:   11/14/21 0001 11/14/21 0553  BP: 112/60 118/78  Pulse: 77 78  Resp: 16 16  Temp: 98.4 F (36.9 C) 98 F (36.7 C)  SpO2: 96% 96%    Recent laboratory studies:  Results for orders placed or performed in visit on 10/21/21  VITAMIN D 25 Hydroxy (Vit-D Deficiency, Fractures)  Result Value Ref Range   Vit D, 25-Hydroxy 46.53 30 - 100 ng/mL  Comprehensive metabolic panel  Result Value Ref Range   Sodium 137 135 - 145 mmol/L   Potassium 4.4 3.5 - 5.1 mmol/L   Chloride 101 98 - 111 mmol/L   CO2 29 22 - 32 mmol/L   Glucose, Bld 102 (H) 70 - 99 mg/dL   BUN 11 8 - 23 mg/dL   Creatinine, Ser 0.85 0.44 - 1.00 mg/dL   Calcium 9.5 8.9 - 10.3 mg/dL   Total Protein 7.4 6.5 - 8.1 g/dL   Albumin 3.9 3.5 - 5.0 g/dL   AST 13 (L) 15 - 41 U/L   ALT 13 0 - 44 U/L   Alkaline Phosphatase 52 38 - 126 U/L   Total Bilirubin 0.4 0.3 - 1.2 mg/dL   GFR, Estimated >60 >60 mL/min   Anion gap 7 5 - 15  CBC with Differential  Result Value Ref Range   WBC 11.7 (H) 4.0 - 10.5 K/uL   RBC  4.70 3.87 - 5.11 MIL/uL   Hemoglobin 13.6 12.0 - 15.0 g/dL   HCT 42.4 36.0 - 46.0 %   MCV 90.2 80.0 - 100.0 fL   MCH 28.9 26.0 - 34.0 pg   MCHC 32.1 30.0 - 36.0 g/dL   RDW 13.3 11.5 - 15.5 %   Platelets 287 150 - 400 K/uL   nRBC 0.0 0.0 - 0.2 %   Neutrophils Relative % 58 %   Neutro Abs 6.9 1.7 - 7.7 K/uL   Lymphocytes Relative 33 %   Lymphs Abs 3.8 0.7 - 4.0 K/uL   Monocytes Relative 7 %   Monocytes Absolute 0.8 0.1 - 1.0 K/uL   Eosinophils Relative 1 %   Eosinophils Absolute 0.1 0.0 - 0.5 K/uL   Basophils Relative 1 %   Basophils Absolute 0.1 0.0 - 0.1 K/uL   Immature Granulocytes 0 %   Abs Immature Granulocytes 0.05 0.00 - 0.07 K/uL    Discharge Medications:   Allergies as of 11/14/2021   No Known Allergies      Medication List     TAKE these medications    acetaminophen 650 MG CR tablet Commonly known as: TYLENOL Take 1,300 mg by mouth daily as needed for pain.   ALPRAZolam  0.25 MG tablet Commonly known as: XANAX Take 0.25 mg by mouth See admin instructions. Take 1 tablet (0.25 mg) by mouth scheduled every morning & may take an additional 2 doses as needed for anxiety.   Anti Monkey Butt Powd Apply 1 Application topically daily as needed (irritation (skin folds--breast area)).   atorvastatin 10 MG tablet Commonly known as: LIPITOR Take 10 mg by mouth every evening.   CALCIUM 600+D3 PO Take 1 tablet by mouth in the morning and at bedtime.   CHANTIX STARTING MONTH PAK PO Take 0.5-1 mg by mouth as directed.   denosumab 60 MG/ML Soln injection Commonly known as: PROLIA Inject 60 mg into the skin every 6 (six) months. Administer in upper arm, thigh, or abdomen   erythromycin ophthalmic ointment Place 1 Application into the right eye at bedtime for 14 days.   escitalopram 10 MG tablet Commonly known as: LEXAPRO Take 10 mg by mouth in the morning.   ferrous sulfate 325 (65 FE) MG tablet Take 325 mg by mouth every Sunday.   Fish Oil 1000 MG Caps Take  1,000 mg by mouth in the morning and at bedtime.   HYDROcodone-acetaminophen 5-325 MG tablet Commonly known as: NORCO/VICODIN Take 1 tablet by mouth every 6 (six) hours as needed for up to 5 days for moderate pain.   hydroxypropyl methylcellulose / hypromellose 2.5 % ophthalmic solution Commonly known as: ISOPTO TEARS / GONIOVISC Place 1-2 drops into the right eye every hour as needed for dry eyes.   Magnesium 250 MG Tabs Take 250 mg by mouth every evening.   tamoxifen 20 MG tablet Commonly known as: NOLVADEX Take 1 tablet (20 mg total) by mouth daily.   Vitamin D 50 MCG (2000 UT) Caps Take 2,000 Units by mouth in the morning.        Diagnostic Studies: No results found.  Disposition: Discharge disposition: 01-Home or Self Care        Plan for post-op visit Monday 11/16/21 for drain removal     Signed: Jenetta Downer 11/14/2021, 7:50 AM

## 2021-11-16 LAB — SURGICAL PATHOLOGY

## 2021-12-23 DIAGNOSIS — M810 Age-related osteoporosis without current pathological fracture: Secondary | ICD-10-CM | POA: Insufficient documentation

## 2021-12-24 ENCOUNTER — Encounter (HOSPITAL_COMMUNITY)
Admission: RE | Admit: 2021-12-24 | Discharge: 2021-12-24 | Disposition: A | Discharge status: Home / Self Care | Payer: PPO | Source: Ambulatory Visit | Attending: Internal Medicine | Admitting: Internal Medicine

## 2021-12-24 VITALS — BP 103/69 | HR 96 | Temp 98.5°F

## 2021-12-24 DIAGNOSIS — Z5189 Encounter for other specified aftercare: Secondary | ICD-10-CM | POA: Insufficient documentation

## 2021-12-24 DIAGNOSIS — M810 Age-related osteoporosis without current pathological fracture: Secondary | ICD-10-CM | POA: Diagnosis not present

## 2021-12-24 MED ORDER — DENOSUMAB 60 MG/ML ~~LOC~~ SOSY
60.0000 mg | PREFILLED_SYRINGE | Freq: Once | SUBCUTANEOUS | Status: AC
  Administered 2021-12-24: 60 mg via SUBCUTANEOUS
  Filled 2021-12-24: qty 1

## 2021-12-24 NOTE — Progress Notes (Signed)
Diagnosis: Osteoporosis  Provider:  Wende Neighbors MD  Procedure: Injection  Prolia (Denosumab), Dose: 60 mg, Site: subcutaneous, Number of injections: 1  Post Care: Patient declined observation  Discharge: Condition: Good, Destination: Home . AVS provided to patient.   Performed by:  Baxter Hire, RN

## 2022-03-11 DIAGNOSIS — J019 Acute sinusitis, unspecified: Secondary | ICD-10-CM | POA: Diagnosis not present

## 2022-03-11 DIAGNOSIS — J439 Emphysema, unspecified: Secondary | ICD-10-CM | POA: Diagnosis not present

## 2022-04-08 ENCOUNTER — Ambulatory Visit (HOSPITAL_COMMUNITY): Payer: PPO

## 2022-04-12 ENCOUNTER — Encounter (HOSPITAL_COMMUNITY): Payer: Self-pay

## 2022-04-12 ENCOUNTER — Ambulatory Visit (HOSPITAL_COMMUNITY)
Admission: RE | Admit: 2022-04-12 | Discharge: 2022-04-12 | Disposition: A | Discharge status: Home / Self Care | Payer: PPO | Source: Ambulatory Visit | Attending: Hematology | Admitting: Hematology

## 2022-04-12 DIAGNOSIS — D0512 Intraductal carcinoma in situ of left breast: Secondary | ICD-10-CM | POA: Insufficient documentation

## 2022-04-12 DIAGNOSIS — Z1231 Encounter for screening mammogram for malignant neoplasm of breast: Secondary | ICD-10-CM | POA: Insufficient documentation

## 2022-04-12 DIAGNOSIS — Z122 Encounter for screening for malignant neoplasm of respiratory organs: Secondary | ICD-10-CM | POA: Diagnosis not present

## 2022-04-22 ENCOUNTER — Ambulatory Visit (HOSPITAL_COMMUNITY)
Admission: RE | Admit: 2022-04-22 | Discharge: 2022-04-22 | Disposition: A | Discharge status: Home / Self Care | Payer: PPO | Source: Ambulatory Visit | Attending: Hematology | Admitting: Hematology

## 2022-04-22 ENCOUNTER — Inpatient Hospital Stay: Payer: PPO | Attending: Hematology

## 2022-04-22 DIAGNOSIS — M810 Age-related osteoporosis without current pathological fracture: Secondary | ICD-10-CM | POA: Insufficient documentation

## 2022-04-22 DIAGNOSIS — Z170 Estrogen receptor positive status [ER+]: Secondary | ICD-10-CM | POA: Diagnosis not present

## 2022-04-22 DIAGNOSIS — Z122 Encounter for screening for malignant neoplasm of respiratory organs: Secondary | ICD-10-CM | POA: Insufficient documentation

## 2022-04-22 DIAGNOSIS — Z79899 Other long term (current) drug therapy: Secondary | ICD-10-CM | POA: Insufficient documentation

## 2022-04-22 DIAGNOSIS — I700 Atherosclerosis of aorta: Secondary | ICD-10-CM | POA: Diagnosis not present

## 2022-04-22 DIAGNOSIS — D0512 Intraductal carcinoma in situ of left breast: Secondary | ICD-10-CM | POA: Insufficient documentation

## 2022-04-22 DIAGNOSIS — Z87891 Personal history of nicotine dependence: Secondary | ICD-10-CM | POA: Diagnosis not present

## 2022-04-22 DIAGNOSIS — J439 Emphysema, unspecified: Secondary | ICD-10-CM | POA: Insufficient documentation

## 2022-04-22 DIAGNOSIS — Z923 Personal history of irradiation: Secondary | ICD-10-CM | POA: Insufficient documentation

## 2022-04-22 DIAGNOSIS — F17210 Nicotine dependence, cigarettes, uncomplicated: Secondary | ICD-10-CM | POA: Diagnosis not present

## 2022-04-22 LAB — COMPREHENSIVE METABOLIC PANEL
ALT: 17 U/L (ref 0–44)
AST: 15 U/L (ref 15–41)
Albumin: 3.6 g/dL (ref 3.5–5.0)
Alkaline Phosphatase: 61 U/L (ref 38–126)
Anion gap: 8 (ref 5–15)
BUN: 7 mg/dL — ABNORMAL LOW (ref 8–23)
CO2: 26 mmol/L (ref 22–32)
Calcium: 8.7 mg/dL — ABNORMAL LOW (ref 8.9–10.3)
Chloride: 101 mmol/L (ref 98–111)
Creatinine, Ser: 0.9 mg/dL (ref 0.44–1.00)
GFR, Estimated: >60 mL/min (ref >60)
Glucose, Bld: 92 mg/dL (ref 70–99)
Potassium: 3.8 mmol/L (ref 3.5–5.1)
Sodium: 135 mmol/L (ref 135–145)
Total Bilirubin: 0.4 mg/dL (ref 0.3–1.2)
Total Protein: 6.9 g/dL (ref 6.5–8.1)

## 2022-04-22 LAB — CBC WITH DIFFERENTIAL/PLATELET
Abs Immature Granulocytes: 0.12 10*3/uL — ABNORMAL HIGH (ref 0.00–0.07)
Basophils Absolute: 0.1 10*3/uL (ref 0.0–0.1)
Basophils Relative: 1 %
Eosinophils Absolute: 0.1 10*3/uL (ref 0.0–0.5)
Eosinophils Relative: 1 %
HCT: 39.6 % (ref 36.0–46.0)
Hemoglobin: 12.9 g/dL (ref 12.0–15.0)
Immature Granulocytes: 1 %
Lymphocytes Relative: 40 %
Lymphs Abs: 4.2 10*3/uL — ABNORMAL HIGH (ref 0.7–4.0)
MCH: 28.9 pg (ref 26.0–34.0)
MCHC: 32.6 g/dL (ref 30.0–36.0)
MCV: 88.8 fL (ref 80.0–100.0)
Monocytes Absolute: 0.7 10*3/uL (ref 0.1–1.0)
Monocytes Relative: 7 %
Neutro Abs: 5.4 10*3/uL (ref 1.7–7.7)
Neutrophils Relative %: 50 %
Platelets: 284 10*3/uL (ref 150–400)
RBC: 4.46 MIL/uL (ref 3.87–5.11)
RDW: 14.1 % (ref 11.5–15.5)
WBC: 10.6 10*3/uL — ABNORMAL HIGH (ref 4.0–10.5)
nRBC: 0 % (ref 0.0–0.2)

## 2022-04-22 LAB — VITAMIN D 25 HYDROXY (VIT D DEFICIENCY, FRACTURES): Vit D, 25-Hydroxy: 64.53 ng/mL (ref 30–100)

## 2022-04-23 NOTE — Progress Notes (Signed)
Patient notified of LDCT Lung Cancer Screening Results via mail with the recommendation to follow-up in 12 months. Patient's referring provider has been sent a copy of results. Results are as follows:  IMPRESSION: 1. Lung-RADS 2, benign appearance or behavior. Continue annual screening with low-dose chest CT without contrast in 12 months. 2. Aortic Atherosclerosis (ICD10-I70.0) and Emphysema (ICD10-J43.9). 

## 2022-04-28 NOTE — Progress Notes (Signed)
[facility] [address] [place] [address]    Clinic Day:  04/29/2022  Referring physician: Benita Stabile, MD  Patient Care Team: Benita Stabile, MD as PCP - General (Internal Medicine)   ASSESSMENT & PLAN:   Assessment: 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 -Biopsy of the left breast on 03/31/2017, DCIS, ER/PR positive -Left lumpectomy on 05/11/2017, intermediate grade DCIS, margins negative - She completed radiation therapy. - Tamoxifen from May 2019 through May 2024   2.  Osteoporosis:  -- Her last DEXA scan on 02/24/2017 shows osteoporosis with a T score of -3.2.  She is receiving Prolia through her PCP.   Plan: 1. Left breast DCIS: - She is tolerating tamoxifen very well. - Reviewed mammogram from 04/12/2022: BI-RADS Category 1. - Reviewed labs: Normal LFTs and CBC. - She was told to continue tamoxifen for another month and stop it. - Recommend follow-up in 1 year with a screening mammogram. - She has a mild leukocytosis for the last 4 times, mostly lymphocytosis.  Hence have recommended a flow cytometry.  Most likely smoking-related.  However rule out monoclonal B-cell lymphocytosis.   2. Osteoporosis (DEXA on 04/21/2020 T -2.7): - Vitamin D level is 64.  Continue calcium and vitamin D supplements.  She takes vitamin D 2000 units daily. - Continue Prolia with Dr. Margo Aye every 6 months.  3.  Smoking history: - Reviewed CT chest lung cancer screening scan from 04/22/2022, lung RADS 2.  We will arrange for a low-dose chest CT in 12 months.  Orders Placed This Encounter  Procedures   CT CHEST NODULE FOLLOW UP LOW DOSE WO CM    Standing Status:   Future    Standing Expiration Date:   04/29/2023    Order Specific Question:   Preferred imaging location?    Answer:   [facility]    Order Specific Question:   Release to patient    Answer:   Immediate   MM 3D SCREENING MAMMOGRAM BILATERAL BREAST     NAS No implants No medical devices Sch'd appt with office on 04/29/22-kjones    Standing Status:   Future    Standing Expiration Date:   04/29/2023    Order Specific Question:   Reason for Exam (SYMPTOM  OR DIAGNOSIS REQUIRED)    Answer:   screening for breast cancer    Order Specific Question:   Preferred imaging location?    Answer:   [facility]    Order Specific Question:   Release to patient    Answer:   Immediate   CBC with Differential/Platelet    Standing Status:   Future    Standing Expiration Date:   04/29/2023    Order Specific Question:   Release to patient    Answer:   Immediate   Comprehensive metabolic panel    Standing Status:   Future    Standing Expiration Date:   04/29/2023    Order Specific Question:   Release to patient    Answer:   Immediate   VITAMIN D 25 Hydroxy (Vit-D Deficiency, Fractures)    Standing Status:   Future    Standing Expiration Date:   04/29/2023    Order Specific Question:   Release to patient    Answer:   Immediate   Flow Cytometry, Peripheral Blood (Oncology)    Standing Status:   Future    Standing Expiration Date:  04/29/2023    Order Specific Question:   Release to patient    Answer:   Immediate      I,Katie Daubenspeck,acting as a scribe for [name], MD.,have documented all relevant documentation on the behalf of [name], MD,as directed by  [name], MD while in the presence of [name], MD.   I, [name] MD, have reviewed the above documentation for accuracy and completeness, and I agree with the above.   [name], MD   4/11/20244:11 PM  CHIEF COMPLAINT:   Diagnosis: left breast DCIS    Cancer Staging  No matching staging information was found for the patient.   Prior Therapy: Lumpectomy and radiation  Current Therapy:  tamoxifen   HISTORY OF PRESENT ILLNESS:   Oncology History  DCIS (ductal carcinoma in situ)  04/12/2017 Initial Diagnosis    DCIS (ductal carcinoma in situ)   09/03/2017 Genetic Testing   Negative genetic testing on the multi-cancer panel.  The Multi-Gene Panel offered by [facility] includes sequencing and/or deletion duplication testing of the following 84 genes: AIP, ALK, APC, ATM, AXIN2,BAP1,  BARD1, BLM, BMPR1A, BRCA1, BRCA2, BRIP1, CASR, CDC73, CDH1, CDK4, CDKN1B, CDKN1C, CDKN2A (p14ARF), CDKN2A (p16INK4a), CEBPA, CHEK2, CTNNA1, DICER1, DIS3L2, EGFR (c.2369C>T, p.Thr790Met variant only), EPCAM (Deletion/duplication testing only), FH, FLCN, GATA2, GPC3, GREM1 (Promoter region deletion/duplication testing only), HOXB13 (c.251G>A, p.Gly84Glu), HRAS, KIT, MAX, MEN1, MET, MITF (c.952G>A, p.Glu318Lys variant only), MLH1, MSH2, MSH3, MSH6, MUTYH, NBN, NF1, NF2, NTHL1, PALB2, PDGFRA, PHOX2B, PMS2, POLD1, POLE, POT1, PRKAR1A, PTCH1, PTEN, RAD50, RAD51C, RAD51D, RB1, RECQL4, RET, RUNX1, SDHAF2, SDHA (sequence changes only), SDHB, SDHC, SDHD, SMAD4, SMARCA4, SMARCB1, SMARCE1, STK11, SUFU, TERC, TERT, TMEM127, TP53, TSC1, TSC2, VHL, WRN and WT1.  The report date is September 03, 2017.      INTERVAL HISTORY:   Melissa Weaver is a 72 y.o. female presenting to clinic today for follow up of left breast DCIS. She was last seen by me on 10/28/21.  Since her last visit, she underwent screening mammogram on 04/12/22 which was negative.  She also underwent screening chest CT on 04/22/22 showing: Lung-RADS 2, benign.  Of note, she also underwent parotidectomy on 11/13/21 by Dr. Pollyann Kennedy. Pathology showed Warthin's tumor, 1.6 cm, and benign lymph nodes.  Today, she states that she is doing well overall. Her appetite level is at 60%. Her energy level is at 60%.  PAST MEDICAL HISTORY:   Past Medical History: Past Medical History:  Diagnosis Date   Anxiety    Arthritis    Complication of anesthesia    Depression    Family history of adverse reaction to anesthesia    son had postoperative N&V   Family history of colon cancer    Family history of  melanoma    Family history of pancreatic cancer    GERD (gastroesophageal reflux disease)    HLD (hyperlipidemia)    HTN (hypertension)    PONV (postoperative nausea and vomiting)    Seizures (HCC)    had a seizure as child, unknown etiology, no meds though. No seizures since 10 years.    Surgical History: Past Surgical History:  Procedure Laterality Date   PAROTIDECTOMY Right 11/13/2021   Procedure: SUPERFICIAL PAROTIDECTOMY WITH FACIAL NERVE DISSECTION;  Surgeon: Serena Colonel, MD;  Location[facility] OR;  Service: ENT;  Laterality: Right;   PARTIAL MASTECTOMY WITH NEEDLE LOCALIZATION Left 05/11/2017   Procedure: PARTIAL MASTECTOMY WITH NEEDLE LOCALIZATION;  Surgeon: Lucretia Roers, MD;  Location: [facility] ORS;  Service: General;  Laterality: Left;  TUBAL LIGATION      Social History: Social History   Socioeconomic History   Marital status: Widowed    Spouse name: Not on file   Number of children: 1   Years of education: Not on file   Highest education level: Not on file  Occupational History   Not on file  Tobacco Use   Smoking status: Every Day    Packs/day: 1.00    Years: 40.00    Additional pack years: 0.00    Total pack years: 40.00    Types: Cigarettes   Smokeless tobacco: Never   Tobacco comments:    Pt attempts to vape at times to quit smoking but has been unsuccessful  Vaping Use   Vaping Use: Some days  Substance and Sexual Activity   Alcohol use: Never   Drug use: Never   Sexual activity: Yes    Birth control/protection: None  Other Topics Concern   Not on file  Social History Narrative   Not on file   Social Determinants of Health   Financial Resource Strain: Not on file  Food Insecurity: No Food Insecurity (11/13/2021)   Hunger Vital Sign    Worried About Running Out of Food in the Last Year: Never true    Ran Out of Food in the Last Year: Never true  Transportation Needs: No Transportation Needs (11/13/2021)   PRAPARE - Scientist, research (physical sciences) (Medical): No    Lack of Transportation (Non-Medical): No  Physical Activity: Not on file  Stress: Not on file  Social Connections: Not on file  Intimate Partner Violence: Not At Risk (11/13/2021)   Humiliation, Afraid, Rape, and Kick questionnaire    Fear of Current or Ex-Partner: No    Emotionally Abused: No    Physically Abused: No    Sexually Abused: No    Family History: Family History  Problem Relation Age of Onset   Pancreatic cancer Mother 66       d. 68   Atrial fibrillation Father    COPD Father    Hypertension Father    High Cholesterol Father    Pancreatic cancer Maternal Aunt 75       d. 61   Pancreatic cancer Maternal Uncle        dx and died in his 23s   Lung cancer Maternal Grandfather 51       d. 18   Heart attack Maternal Grandfather 47       d. 92   Lung cancer Maternal Aunt    Leukemia Maternal Uncle        dx in his 30s-40s   Heart attack Maternal Uncle    Melanoma Other        MGMs sister   Colon cancer Other        MGMs mother    Current Medications:  Current Outpatient Medications:    acetaminophen (TYLENOL) 650 MG CR tablet, Take 1,300 mg by mouth daily as needed for pain., Disp: , Rfl:    albuterol (VENTOLIN HFA) 108 (90 Base) MCG/ACT inhaler, Inhale 2 puffs into the lungs every 4 (four) hours as needed., Disp: , Rfl:    ALPRAZolam (XANAX) 0.25 MG tablet, Take 0.25 mg by mouth See admin instructions. Take 1 tablet (0.25 mg) by mouth scheduled every morning & may take an additional 2 doses as needed for anxiety., Disp: , Rfl:    atorvastatin (LIPITOR) 10 MG tablet, Take 10 mg by mouth every evening., Disp: ,  Rfl:    Calcium Carb-Cholecalciferol (CALCIUM 600+D3 PO), Take 1 tablet by mouth in the morning and at bedtime., Disp: , Rfl:    Cholecalciferol (VITAMIN D) 50 MCG (2000 UT) CAPS, Take 2,000 Units by mouth in the morning., Disp: , Rfl:    denosumab (PROLIA) 60 MG/ML SOLN injection, Inject 60 mg into the skin every 6 (six)  months. Administer in upper arm, thigh, or abdomen, Disp: , Rfl:    escitalopram (LEXAPRO) 10 MG tablet, Take 10 mg by mouth in the morning., Disp: , Rfl:    ferrous sulfate 325 (65 FE) MG tablet, Take 325 mg by mouth every Sunday., Disp: , Rfl:    lisinopril (ZESTRIL) 10 MG tablet, Take by mouth., Disp: , Rfl:    Magnesium 250 MG TABS, Take 250 mg by mouth every evening., Disp: , Rfl:    Omega-3 Fatty Acids (FISH OIL) 1000 MG CAPS, Take 1,000 mg by mouth in the morning and at bedtime., Disp: , Rfl:    Powders (ANTI MONKEY BUTT) POWD, Apply 1 Application topically daily as needed (irritation (skin folds--breast area))., Disp: , Rfl:    tamoxifen (NOLVADEX) 20 MG tablet, Take 1 tablet (20 mg total) by mouth daily., Disp: 90 tablet, Rfl: 3   TRELEGY ELLIPTA 100-62.5-25 MCG/ACT AEPB, Take 1 puff by mouth daily., Disp: , Rfl:    Allergies: No Known Allergies  REVIEW OF SYSTEMS:   Review of Systems  Constitutional:  Negative for chills, fatigue and fever.  HENT:   Negative for lump/mass, mouth sores, nosebleeds, sore throat and trouble swallowing.   Eyes:  Negative for eye problems.  Respiratory:  Positive for cough and shortness of breath.   Cardiovascular:  Negative for chest pain, leg swelling and palpitations.  Gastrointestinal:  Positive for diarrhea. Negative for abdominal pain, constipation, nausea and vomiting.  Genitourinary:  Negative for bladder incontinence, difficulty urinating, dysuria, frequency, hematuria and nocturia.   Musculoskeletal:  Negative for arthralgias, back pain, flank pain, myalgias and neck pain.  Skin:  Negative for itching and rash.  Neurological:  Positive for headaches and numbness. Negative for dizziness.  Hematological:  Does not bruise/bleed easily.  Psychiatric/Behavioral:  Positive for sleep disturbance. Negative for depression and suicidal ideas. The patient is not nervous/anxious.   All other systems reviewed and are negative.    VITALS:   Blood  pressure 135/68, pulse 91, temperature 99 F (37.2 C), temperature source Oral, resp. rate 18, height 5\' 2"  (1.575 m), weight 190 lb 4.8 oz (86.3 kg), SpO2 94 %.  Wt Readings from Last 3 Encounters:  04/29/22 190 lb 4.8 oz (86.3 kg)  11/13/21 183 lb (83 kg)  11/09/21 183 lb (83 kg)    Body mass index is 34.81 kg/m.  Performance status (ECOG): 1 - Symptomatic but completely ambulatory  PHYSICAL EXAM:   Physical Exam Vitals and nursing note reviewed. Exam conducted with a chaperone present.  Constitutional:      Appearance: Normal appearance.  Cardiovascular:     Rate and Rhythm: Normal rate and regular rhythm.     Pulses: Normal pulses.     Heart sounds: Normal heart sounds.  Pulmonary:     Effort: Pulmonary effort is normal.     Breath sounds: Normal breath sounds.  Abdominal:     Palpations: Abdomen is soft. There is no hepatomegaly, splenomegaly or mass.     Tenderness: There is no abdominal tenderness.  Musculoskeletal:     Right lower leg: No edema.  Left lower leg: No edema.  Lymphadenopathy:     Cervical: No cervical adenopathy.     Right cervical: No superficial, deep or posterior cervical adenopathy.    Left cervical: No superficial, deep or posterior cervical adenopathy.     Upper Body:     Right upper body: No supraclavicular or axillary adenopathy.     Left upper body: No supraclavicular or axillary adenopathy.  Neurological:     General: No focal deficit present.     Mental Status: She is alert and oriented to person, place, and time.  Psychiatric:        Mood and Affect: Mood normal.        Behavior: Behavior normal.     LABS:      Latest Ref Rng & Units 04/22/2022    3:08 PM 10/21/2021   10:51 AM 04/22/2021   11:34 AM  CBC  WBC 4.0 - 10.5 K/uL 10.6  11.7  14.7   Hemoglobin 12.0 - 15.0 g/dL 16.112.9  09.613.6  04.513.7   Hematocrit 36.0 - 46.0 % 39.6  42.4  42.2   Platelets 150 - 400 K/uL 284  287  311       Latest Ref Rng & Units 04/22/2022    3:08 PM  10/21/2021   10:51 AM 06/05/2021    8:02 AM  CMP  Glucose 70 - 99 mg/dL 92  409102    BUN 8 - 23 mg/dL 7  11    Creatinine 8.110.44 - 1.00 mg/dL 9.140.90  7.820.85  9.560.90   Sodium 135 - 145 mmol/L 135  137    Potassium 3.5 - 5.1 mmol/L 3.8  4.4    Chloride 98 - 111 mmol/L 101  101    CO2 22 - 32 mmol/L 26  29    Calcium 8.9 - 10.3 mg/dL 8.7  9.5    Total Protein 6.5 - 8.1 g/dL 6.9  7.4    Total Bilirubin 0.3 - 1.2 mg/dL 0.4  0.4    Alkaline Phos 38 - 126 U/L 61  52    AST 15 - 41 U/L 15  13    ALT 0 - 44 U/L 17  13       No results found for: "CEA1", "CEA" / No results found for: "CEA1", "CEA" No results found for: "PSA1" No results found for: "OZH086CAN199" No results found for: "CAN125"  No results found for: "TOTALPROTELP", "ALBUMINELP", "A1GS", "A2GS", "BETS", "BETA2SER", "GAMS", "MSPIKE", "SPEI" No results found for: "TIBC", "FERRITIN", "IRONPCTSAT" Lab Results  Component Value Date   LDH 165 04/22/2021   LDH 166 10/20/2020   LDH 151 04/01/2020     STUDIES:   CT CHEST LUNG CA SCREEN LOW DOSE W/O CM  Result Date: 04/23/2022 CLINICAL DATA:  Lung cancer screening. Fifty-four pack-year history. Current asymptomatic smoker. EXAM: CT CHEST WITHOUT CONTRAST LOW-DOSE FOR LUNG CANCER SCREENING TECHNIQUE: Multidetector CT imaging of the chest was performed following the standard protocol without IV contrast. RADIATION DOSE REDUCTION: This exam was performed according to the departmental dose-optimization program which includes automated exposure control, adjustment of the mA and/or kV according to patient size and/or use of iterative reconstruction technique. COMPARISON:  04/22/2021 FINDINGS: Cardiovascular: Normal heart size. No pericardial effusion. Aortic atherosclerosis and coronary artery calcifications. Mediastinum/Nodes: No enlarged mediastinal, hilar, or axillary lymph nodes. Thyroid gland, trachea, and esophagus demonstrate no significant findings. Lungs/Pleura: Centrilobular and paraseptal  emphysema. No pleural fluid or airspace disease. The central airways appear patent. Unchanged subpleural nodule  within the periphery of the right middle lobe with a mean derived diameter of 3.8 mm. Upper Abdomen: No acute abnormality. Unchanged left adrenal nodule measuring 1.6 cm, image 57/2. This is compatible with a benign adenoma. No follow-up imaging recommended. Musculoskeletal: No chest wall mass or suspicious bone lesions identified.Asymmetric skin thickening overlying the left breast, which is favored to represent post treatment changes in appears similar to previous exam. IMPRESSION: 1. Lung-RADS 2, benign appearance or behavior. Continue annual screening with low-dose chest CT without contrast in 12 months. 2. Aortic Atherosclerosis (ICD10-I70.0) and Emphysema (ICD10-J43.9). Electronically Signed   By: Signa Kell M.D.   On: 04/23/2022 10:26   MM 3D SCREEN BREAST BILATERAL  Result Date: 04/13/2022 CLINICAL DATA:  Screening. EXAM: DIGITAL SCREENING BILATERAL MAMMOGRAM WITH TOMOSYNTHESIS AND CAD TECHNIQUE: Bilateral screening digital craniocaudal and mediolateral oblique mammograms were obtained. Bilateral screening digital breast tomosynthesis was performed. The images were evaluated with computer-aided detection. COMPARISON:  Previous exam(s). ACR Breast Density Category b: There are scattered areas of fibroglandular density. FINDINGS: There are no findings suspicious for malignancy. IMPRESSION: No mammographic evidence of malignancy. A result letter of this screening mammogram will be mailed directly to the patient. RECOMMENDATION: Screening mammogram in one year. (Code:SM-B-01Y) BI-RADS CATEGORY  1: Negative. Electronically Signed   By: Bary Richard M.D.   On: 04/13/2022 15:05

## 2022-04-29 ENCOUNTER — Inpatient Hospital Stay (HOSPITAL_BASED_OUTPATIENT_CLINIC_OR_DEPARTMENT_OTHER): Payer: PPO | Admitting: Hematology

## 2022-04-29 VITALS — BP 135/68 | HR 91 | Temp 99.0°F | Resp 18 | Ht 62.0 in | Wt 190.3 lb

## 2022-04-29 DIAGNOSIS — D0512 Intraductal carcinoma in situ of left breast: Secondary | ICD-10-CM | POA: Diagnosis not present

## 2022-04-29 DIAGNOSIS — Z122 Encounter for screening for malignant neoplasm of respiratory organs: Secondary | ICD-10-CM

## 2022-04-29 NOTE — Patient Instructions (Signed)
[facility]  Discharge Instructions  You were seen and examined today by Dr. Ellin Saba.  Dr. Ellin Saba discussed your most recent lab work which revealed that everything looks good except your white blood cells are slightly elevated.  Dr. Ellin Saba will repeat mammogram and CT of your chest.  Follow-up as scheduled in 1 year.    Thank you for choosing [facility] to provide your oncology and hematology care.   To afford each patient quality time with our provider, please arrive at least 15 minutes before your scheduled appointment time. You may need to reschedule your appointment if you arrive late (10 or more minutes). Arriving late affects you and other patients whose appointments are after yours.  Also, if you miss three or more appointments without notifying the office, you may be dismissed from the clinic at the provider's discretion.    Again, thank you for choosing [facility].  Our hope is that these requests will decrease the amount of time that you wait before being seen by our physicians.   If you have a lab appointment with the Cancer Center - please note that after April 8th, all labs will be drawn in the cancer center.  You do not have to check in or register with the main entrance as you have in the past but will complete your check-in at the cancer center.            _____________________________________________________________  Should you have questions after your visit to [facility], please contact our office at [phone] and follow the prompts.  Our office hours are 8:00 a.m. to 4:30 p.m. Monday - Thursday and 8:00 a.m. to 2:30 p.m. Friday.  Please note that voicemails left after 4:00 p.m. may not be returned until the following business day.  We are closed weekends and all major holidays.  You do have access to a nurse 24-7, just call the main number to the clinic [phone] and do  not press any options, hold on the line and a nurse will answer the phone.    For prescription refill requests, have your pharmacy contact our office and allow 72 hours.    Masks are no longer required in the cancer centers. If you would like for your care team to wear a mask while they are taking care of you, please let them know. You may have one support person who is at least 71 years old accompany you for your appointments.

## 2022-05-05 DIAGNOSIS — R7303 Prediabetes: Secondary | ICD-10-CM | POA: Diagnosis not present

## 2022-05-05 DIAGNOSIS — E785 Hyperlipidemia, unspecified: Secondary | ICD-10-CM | POA: Diagnosis not present

## 2022-05-05 DIAGNOSIS — E041 Nontoxic single thyroid nodule: Secondary | ICD-10-CM | POA: Diagnosis not present

## 2022-05-12 ENCOUNTER — Other Ambulatory Visit (HOSPITAL_COMMUNITY): Payer: Self-pay | Admitting: Family Medicine

## 2022-05-12 DIAGNOSIS — Z0000 Encounter for general adult medical examination without abnormal findings: Secondary | ICD-10-CM | POA: Diagnosis not present

## 2022-05-12 DIAGNOSIS — R7303 Prediabetes: Secondary | ICD-10-CM | POA: Diagnosis not present

## 2022-05-12 DIAGNOSIS — E041 Nontoxic single thyroid nodule: Secondary | ICD-10-CM | POA: Diagnosis not present

## 2022-05-12 DIAGNOSIS — Z6833 Body mass index (BMI) 33.0-33.9, adult: Secondary | ICD-10-CM | POA: Diagnosis not present

## 2022-05-12 DIAGNOSIS — E785 Hyperlipidemia, unspecified: Secondary | ICD-10-CM | POA: Diagnosis not present

## 2022-05-12 DIAGNOSIS — F17200 Nicotine dependence, unspecified, uncomplicated: Secondary | ICD-10-CM | POA: Diagnosis not present

## 2022-05-12 DIAGNOSIS — Z716 Tobacco abuse counseling: Secondary | ICD-10-CM | POA: Diagnosis not present

## 2022-05-12 DIAGNOSIS — Z13820 Encounter for screening for osteoporosis: Secondary | ICD-10-CM

## 2022-05-12 DIAGNOSIS — Z853 Personal history of malignant neoplasm of breast: Secondary | ICD-10-CM | POA: Diagnosis not present

## 2022-05-12 DIAGNOSIS — R52 Pain, unspecified: Secondary | ICD-10-CM | POA: Diagnosis not present

## 2022-05-12 DIAGNOSIS — I700 Atherosclerosis of aorta: Secondary | ICD-10-CM | POA: Diagnosis not present

## 2022-05-12 DIAGNOSIS — Z713 Dietary counseling and surveillance: Secondary | ICD-10-CM | POA: Diagnosis not present

## 2022-05-12 DIAGNOSIS — J439 Emphysema, unspecified: Secondary | ICD-10-CM | POA: Diagnosis not present

## 2022-05-24 ENCOUNTER — Ambulatory Visit (HOSPITAL_COMMUNITY)
Admission: RE | Admit: 2022-05-24 | Discharge: 2022-05-24 | Disposition: A | Discharge status: Home / Self Care | Payer: PPO | Source: Ambulatory Visit | Attending: Family Medicine | Admitting: Family Medicine

## 2022-05-24 DIAGNOSIS — Z780 Asymptomatic menopausal state: Secondary | ICD-10-CM | POA: Diagnosis not present

## 2022-05-24 DIAGNOSIS — Z13820 Encounter for screening for osteoporosis: Secondary | ICD-10-CM | POA: Diagnosis not present

## 2022-05-24 DIAGNOSIS — M8589 Other specified disorders of bone density and structure, multiple sites: Secondary | ICD-10-CM | POA: Diagnosis not present

## 2022-05-24 DIAGNOSIS — F17200 Nicotine dependence, unspecified, uncomplicated: Secondary | ICD-10-CM | POA: Insufficient documentation

## 2022-05-24 DIAGNOSIS — M810 Age-related osteoporosis without current pathological fracture: Secondary | ICD-10-CM | POA: Diagnosis not present

## 2022-06-01 DIAGNOSIS — R059 Cough, unspecified: Secondary | ICD-10-CM | POA: Diagnosis not present

## 2022-06-01 DIAGNOSIS — J069 Acute upper respiratory infection, unspecified: Secondary | ICD-10-CM | POA: Diagnosis not present

## 2022-06-01 DIAGNOSIS — J439 Emphysema, unspecified: Secondary | ICD-10-CM | POA: Diagnosis not present

## 2022-06-24 ENCOUNTER — Encounter (HOSPITAL_COMMUNITY): Payer: PPO

## 2022-06-28 ENCOUNTER — Encounter (HOSPITAL_COMMUNITY)
Admission: RE | Admit: 2022-06-28 | Discharge: 2022-06-28 | Disposition: A | Discharge status: Home / Self Care | Payer: PPO | Source: Ambulatory Visit | Attending: Internal Medicine | Admitting: Internal Medicine

## 2022-06-28 VITALS — BP 110/62 | HR 93 | Temp 98.8°F | Resp 16

## 2022-06-28 DIAGNOSIS — M810 Age-related osteoporosis without current pathological fracture: Secondary | ICD-10-CM | POA: Diagnosis not present

## 2022-06-28 MED ORDER — DENOSUMAB 60 MG/ML ~~LOC~~ SOSY
60.0000 mg | PREFILLED_SYRINGE | Freq: Once | SUBCUTANEOUS | Status: AC
  Administered 2022-06-28: 60 mg via SUBCUTANEOUS

## 2022-06-28 NOTE — Addendum Note (Signed)
Encounter addended by: Wyvonne Lenz, RN on: 06/28/2022 3:51 PM  Actions taken: Therapy plan modified

## 2022-06-28 NOTE — Progress Notes (Signed)
Diagnosis: Osteoporosis  Provider:  Dwana Melena MD  Procedure: Injection  Prolia (Denosumab), Dose: 60 mg, Site: subcutaneous, Number of injections: 1  Post Care: Patient declined observation  Discharge: Condition: Good, Destination: Home . AVS Provided  Performed by:  Fritzi Mandes, RN

## 2022-07-14 DIAGNOSIS — K219 Gastro-esophageal reflux disease without esophagitis: Secondary | ICD-10-CM | POA: Diagnosis not present

## 2022-08-04 ENCOUNTER — Other Ambulatory Visit: Payer: Self-pay

## 2022-09-09 ENCOUNTER — Other Ambulatory Visit: Payer: Self-pay

## 2022-09-13 ENCOUNTER — Telehealth: Payer: Self-pay | Admitting: Pharmacy Technician

## 2022-09-13 NOTE — Telephone Encounter (Signed)
Auth Submission: NO AUTH NEEDED Site of care: Site of care: [facility] Payer: [employer] ADVT Medication & CPT/J Code(s) submitted: Prolia (Denosumab) [id number] Route of submission (phone, fax, portal):  Phone # Fax # Auth type: Buy/Bill PB Units/visits requested: X1 Reference number: [id number] 09/13/22 3:20 Approval from: 09/13/22 to 01/17/23

## 2022-11-03 DIAGNOSIS — E041 Nontoxic single thyroid nodule: Secondary | ICD-10-CM | POA: Diagnosis not present

## 2022-11-03 DIAGNOSIS — E785 Hyperlipidemia, unspecified: Secondary | ICD-10-CM | POA: Diagnosis not present

## 2022-11-03 DIAGNOSIS — R7303 Prediabetes: Secondary | ICD-10-CM | POA: Diagnosis not present

## 2022-11-09 DIAGNOSIS — I700 Atherosclerosis of aorta: Secondary | ICD-10-CM | POA: Diagnosis not present

## 2022-11-09 DIAGNOSIS — R7303 Prediabetes: Secondary | ICD-10-CM | POA: Diagnosis not present

## 2022-11-09 DIAGNOSIS — E041 Nontoxic single thyroid nodule: Secondary | ICD-10-CM | POA: Diagnosis not present

## 2022-11-09 DIAGNOSIS — E669 Obesity, unspecified: Secondary | ICD-10-CM | POA: Diagnosis not present

## 2022-11-09 DIAGNOSIS — E785 Hyperlipidemia, unspecified: Secondary | ICD-10-CM | POA: Diagnosis not present

## 2022-11-09 DIAGNOSIS — F17200 Nicotine dependence, unspecified, uncomplicated: Secondary | ICD-10-CM | POA: Diagnosis not present

## 2022-11-09 DIAGNOSIS — J439 Emphysema, unspecified: Secondary | ICD-10-CM | POA: Diagnosis not present

## 2022-11-09 DIAGNOSIS — R252 Cramp and spasm: Secondary | ICD-10-CM | POA: Diagnosis not present

## 2022-11-09 DIAGNOSIS — F17210 Nicotine dependence, cigarettes, uncomplicated: Secondary | ICD-10-CM | POA: Diagnosis not present

## 2022-11-09 DIAGNOSIS — Z23 Encounter for immunization: Secondary | ICD-10-CM | POA: Diagnosis not present

## 2022-11-09 DIAGNOSIS — Z853 Personal history of malignant neoplasm of breast: Secondary | ICD-10-CM | POA: Diagnosis not present

## 2022-11-09 DIAGNOSIS — Z713 Dietary counseling and surveillance: Secondary | ICD-10-CM | POA: Diagnosis not present

## 2022-12-23 ENCOUNTER — Encounter (HOSPITAL_COMMUNITY): Admission: RE | Admit: 2022-12-23 | Payer: PPO | Source: Ambulatory Visit

## 2022-12-29 ENCOUNTER — Inpatient Hospital Stay (HOSPITAL_COMMUNITY): Admission: RE | Admit: 2022-12-29 | Payer: PPO | Source: Ambulatory Visit

## 2022-12-29 ENCOUNTER — Encounter: Payer: PPO | Attending: Internal Medicine | Admitting: *Deleted

## 2022-12-29 ENCOUNTER — Other Ambulatory Visit: Payer: Self-pay

## 2022-12-29 VITALS — BP 114/67 | HR 88 | Temp 98.4°F | Resp 18

## 2022-12-29 DIAGNOSIS — M810 Age-related osteoporosis without current pathological fracture: Secondary | ICD-10-CM

## 2022-12-29 MED ORDER — DENOSUMAB 60 MG/ML ~~LOC~~ SOSY
60.0000 mg | PREFILLED_SYRINGE | Freq: Once | SUBCUTANEOUS | Status: AC
  Administered 2022-12-29: 60 mg via SUBCUTANEOUS

## 2022-12-29 NOTE — Progress Notes (Signed)
Diagnosis: Osteoporosis  Provider:  Dwana Melena MD  Procedure: Injection  Prolia (Denosumab), Dose: 60 mg, Site: subcutaneous, Number of injections: 1  Abdominal tissue   Injection Site(s): Right upper quad. abdomen  Post Care: Observation period completed  Discharge: Condition: Good, Destination: Home . AVS Provided  Performed by:  Daleen Squibb, RN

## 2023-01-20 ENCOUNTER — Telehealth: Payer: Self-pay

## 2023-01-20 NOTE — Telephone Encounter (Signed)
 Auth Submission: NO AUTH NEEDED Site of care: Site of care: [facility] Payer: [employer] Medication & CPT/J Code(s) submitted: Prolia  (Denosumab ) [id number] Route of submission (phone, fax, portal): phone Phone # Fax # Auth type: Buy/Bill PB Units/visits requested: 60mg , q75months Reference number: [id number] Approval from: 01/20/23 to 01/18/24

## 2023-01-24 ENCOUNTER — Other Ambulatory Visit: Payer: Self-pay | Admitting: Pharmacy Technician

## 2023-02-21 ENCOUNTER — Other Ambulatory Visit: Payer: Self-pay

## 2023-02-21 DIAGNOSIS — Z122 Encounter for screening for malignant neoplasm of respiratory organs: Secondary | ICD-10-CM

## 2023-02-21 DIAGNOSIS — Z87891 Personal history of nicotine dependence: Secondary | ICD-10-CM

## 2023-03-09 ENCOUNTER — Encounter: Payer: Self-pay | Admitting: Internal Medicine

## 2023-04-13 ENCOUNTER — Other Ambulatory Visit (HOSPITAL_COMMUNITY): Payer: Self-pay | Admitting: Family Medicine

## 2023-04-13 DIAGNOSIS — N3281 Overactive bladder: Secondary | ICD-10-CM | POA: Diagnosis not present

## 2023-04-13 DIAGNOSIS — Z6836 Body mass index (BMI) 36.0-36.9, adult: Secondary | ICD-10-CM | POA: Diagnosis not present

## 2023-04-13 DIAGNOSIS — M545 Low back pain, unspecified: Secondary | ICD-10-CM | POA: Diagnosis not present

## 2023-04-13 DIAGNOSIS — Z713 Dietary counseling and surveillance: Secondary | ICD-10-CM | POA: Diagnosis not present

## 2023-04-13 DIAGNOSIS — E785 Hyperlipidemia, unspecified: Secondary | ICD-10-CM | POA: Diagnosis not present

## 2023-04-13 DIAGNOSIS — R32 Unspecified urinary incontinence: Secondary | ICD-10-CM | POA: Diagnosis not present

## 2023-04-13 DIAGNOSIS — Z79899 Other long term (current) drug therapy: Secondary | ICD-10-CM | POA: Diagnosis not present

## 2023-04-13 DIAGNOSIS — F17210 Nicotine dependence, cigarettes, uncomplicated: Secondary | ICD-10-CM | POA: Diagnosis not present

## 2023-04-15 ENCOUNTER — Other Ambulatory Visit: Payer: Self-pay | Admitting: Internal Medicine

## 2023-04-15 ENCOUNTER — Ambulatory Visit (HOSPITAL_COMMUNITY)
Admission: RE | Admit: 2023-04-15 | Discharge: 2023-04-15 | Disposition: A | Discharge status: Home / Self Care | Payer: PPO | Source: Ambulatory Visit | Attending: Hematology | Admitting: Hematology

## 2023-04-15 ENCOUNTER — Encounter (HOSPITAL_COMMUNITY): Payer: Self-pay

## 2023-04-15 ENCOUNTER — Inpatient Hospital Stay: Payer: PPO | Attending: Hematology

## 2023-04-15 DIAGNOSIS — Z122 Encounter for screening for malignant neoplasm of respiratory organs: Secondary | ICD-10-CM

## 2023-04-15 DIAGNOSIS — D72829 Elevated white blood cell count, unspecified: Secondary | ICD-10-CM | POA: Diagnosis not present

## 2023-04-15 DIAGNOSIS — D0512 Intraductal carcinoma in situ of left breast: Secondary | ICD-10-CM | POA: Diagnosis not present

## 2023-04-15 DIAGNOSIS — F17210 Nicotine dependence, cigarettes, uncomplicated: Secondary | ICD-10-CM | POA: Diagnosis not present

## 2023-04-15 DIAGNOSIS — M810 Age-related osteoporosis without current pathological fracture: Secondary | ICD-10-CM | POA: Diagnosis not present

## 2023-04-15 DIAGNOSIS — M51369 Other intervertebral disc degeneration, lumbar region without mention of lumbar back pain or lower extremity pain: Secondary | ICD-10-CM | POA: Diagnosis not present

## 2023-04-15 DIAGNOSIS — Z87891 Personal history of nicotine dependence: Secondary | ICD-10-CM

## 2023-04-15 DIAGNOSIS — Z1231 Encounter for screening mammogram for malignant neoplasm of breast: Secondary | ICD-10-CM | POA: Insufficient documentation

## 2023-04-15 DIAGNOSIS — M48061 Spinal stenosis, lumbar region without neurogenic claudication: Secondary | ICD-10-CM | POA: Diagnosis not present

## 2023-04-15 DIAGNOSIS — M47816 Spondylosis without myelopathy or radiculopathy, lumbar region: Secondary | ICD-10-CM | POA: Diagnosis not present

## 2023-04-15 DIAGNOSIS — M545 Low back pain, unspecified: Secondary | ICD-10-CM

## 2023-04-15 DIAGNOSIS — M5126 Other intervertebral disc displacement, lumbar region: Secondary | ICD-10-CM | POA: Diagnosis not present

## 2023-04-15 HISTORY — DX: Personal history of irradiation: Z92.3

## 2023-04-15 LAB — CBC WITH DIFFERENTIAL/PLATELET
Abs Immature Granulocytes: 0.05 10*3/uL (ref 0.00–0.07)
Basophils Absolute: 0.1 10*3/uL (ref 0.0–0.1)
Basophils Relative: 1 %
Eosinophils Absolute: 0.2 10*3/uL (ref 0.0–0.5)
Eosinophils Relative: 2 %
HCT: 43.3 % (ref 36.0–46.0)
Hemoglobin: 13.5 g/dL (ref 12.0–15.0)
Immature Granulocytes: 1 %
Lymphocytes Relative: 39 %
Lymphs Abs: 4.3 10*3/uL — ABNORMAL HIGH (ref 0.7–4.0)
MCH: 27.7 pg (ref 26.0–34.0)
MCHC: 31.2 g/dL (ref 30.0–36.0)
MCV: 88.7 fL (ref 80.0–100.0)
Monocytes Absolute: 0.9 10*3/uL (ref 0.1–1.0)
Monocytes Relative: 8 %
Neutro Abs: 5.5 10*3/uL (ref 1.7–7.7)
Neutrophils Relative %: 49 %
Platelets: 324 10*3/uL (ref 150–400)
RBC: 4.88 MIL/uL (ref 3.87–5.11)
RDW: 15.1 % (ref 11.5–15.5)
WBC: 11 10*3/uL — ABNORMAL HIGH (ref 4.0–10.5)
nRBC: 0 % (ref 0.0–0.2)

## 2023-04-15 LAB — COMPREHENSIVE METABOLIC PANEL WITH GFR
ALT: 30 U/L (ref 0–44)
AST: 25 U/L (ref 15–41)
Albumin: 3.9 g/dL (ref 3.5–5.0)
Alkaline Phosphatase: 79 U/L (ref 38–126)
Anion gap: 11 (ref 5–15)
BUN: 16 mg/dL (ref 8–23)
CO2: 27 mmol/L (ref 22–32)
Calcium: 10 mg/dL (ref 8.9–10.3)
Chloride: 99 mmol/L (ref 98–111)
Creatinine, Ser: 0.81 mg/dL (ref 0.44–1.00)
GFR, Estimated: >60 mL/min (ref >60)
Glucose, Bld: 134 mg/dL — ABNORMAL HIGH (ref 70–99)
Potassium: 4.8 mmol/L (ref 3.5–5.1)
Sodium: 137 mmol/L (ref 135–145)
Total Bilirubin: 0.6 mg/dL (ref 0.0–1.2)
Total Protein: 7.7 g/dL (ref 6.5–8.1)

## 2023-04-15 LAB — VITAMIN D 25 HYDROXY (VIT D DEFICIENCY, FRACTURES): Vit D, 25-Hydroxy: 67.12 ng/mL (ref 30–100)

## 2023-04-18 LAB — SURGICAL PATHOLOGY

## 2023-04-22 LAB — FLOW CYTOMETRY

## 2023-04-26 NOTE — Progress Notes (Signed)
 [facility] [address], [place] [address]    Clinic Day:  04/27/2023  Referring physician: Benita Stabile, MD  Patient Care Team: Melissa Stabile, MD as PCP - General (Internal Medicine)   ASSESSMENT & PLAN:   Assessment: 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 -Biopsy of the left breast on 03/31/2017, DCIS, ER/PR positive -Left lumpectomy on 05/11/2017, intermediate grade DCIS, margins negative - She completed radiation therapy. - Tamoxifen from May 2019 through May 2024   2.  Osteoporosis:  -- Her last DEXA scan on 02/24/2017 shows osteoporosis with a T score of -3.2.  She is receiving Prolia through her PCP.   Plan: 1. Left breast DCIS: - She has completed tamoxifen in May 2024. - Mammogram on 04/15/2023: BI-RADS Category 1. - Will arrange for mammogram next year.   2. Osteoporosis (DEXA on 04/21/2020 T -2.7): - Continue calcium and vitamin D supplements.  Vitamin D level is 67. - Continue Prolia with Dr. Margo Weaver every 6 months.  3.  Smoking history: - Last CT chest was lung RADS 2 in April 2024.  We have done a repeat screening CT scan on 04/15/2023.  It is not reported yet.  Will follow-up on it and arrange for another scan in a year.  4.  Lymphocytic leukocytosis: - She has very mild leukocytosis with mildly elevated absolute lymphocyte count.  We have done flow cytometry on 04/15/2023 which did not show any monoclonal B-cell or T-cell leukocytosis.  Likely from smoking.  Orders Placed This Encounter  Procedures   CT CHEST LUNG CA SCREEN LOW DOSE W/O CM    Standing Status:   Future    Expiration Date:   04/26/2024    Preferred Imaging Location?:   [facility]   CBC with Differential    Standing Status:   Future    Expected Date:   04/23/2024    Expiration Date:   04/26/2024   Comprehensive metabolic panel    Standing Status:   Future    Expected Date:   04/23/2024    Expiration Date:   04/26/2024    VITAMIN D 25 Hydroxy (Vit-D Deficiency, Fractures)    Standing Status:   Future    Expected Date:   04/23/2024    Expiration Date:   04/26/2024      Melissa Weaver,acting as a scribe for Melissa Massed, MD.,have documented all relevant documentation on the behalf of Melissa Massed, MD,as directed by  Melissa Massed, MD while in the presence of Melissa Massed, MD.  I, Melissa Massed MD, have reviewed the above documentation for accuracy and completeness, and I agree with the above.    Melissa Massed, MD   4/9/20253:46 PM  CHIEF COMPLAINT:   Diagnosis: left breast DCIS    Cancer Staging  No matching staging information was found for the patient.    Prior Therapy: Lumpectomy and radiation  Current Therapy:  tamoxifen   HISTORY OF PRESENT ILLNESS:   Oncology History  DCIS (ductal carcinoma in situ)  04/12/2017 Initial Diagnosis   DCIS (ductal carcinoma in situ)   09/03/2017 Genetic Testing   Negative genetic testing on the multi-cancer panel.  The Multi-Gene Panel offered by [facility] includes sequencing and/or deletion duplication testing of the following 84 genes: AIP, ALK, APC, ATM, AXIN2,BAP1,  BARD1, BLM, BMPR1A, BRCA1, BRCA2, BRIP1, CASR, CDC73, CDH1, CDK4, CDKN1B, CDKN1C, CDKN2A (p14ARF), CDKN2A (p16INK4a), CEBPA, CHEK2, CTNNA1, DICER1, DIS3L2,  EGFR (c.2369C>T, p.Thr790Met variant only), EPCAM (Deletion/duplication testing only), FH, FLCN, GATA2, GPC3, GREM1 (Promoter region deletion/duplication testing only), HOXB13 (c.251G>A, p.Gly84Glu), HRAS, KIT, MAX, MEN1, MET, MITF (c.952G>A, p.Glu318Lys variant only), MLH1, MSH2, MSH3, MSH6, MUTYH, NBN, NF1, NF2, NTHL1, PALB2, PDGFRA, PHOX2B, PMS2, POLD1, POLE, POT1, PRKAR1A, PTCH1, PTEN, RAD50, RAD51C, RAD51D, RB1, RECQL4, RET, RUNX1, SDHAF2, SDHA (sequence changes only), SDHB, SDHC, SDHD, SMAD4, SMARCA4, SMARCB1, SMARCE1, STK11, SUFU, TERC, TERT, TMEM127, TP53, TSC1, TSC2, VHL, WRN and WT1.  The report date  is September 03, 2017.      INTERVAL HISTORY:   Melissa Weaver is a 73 y.o. female presenting to clinic today for follow up of left breast DCIS. She was last seen by me on 04/29/22.  Since her last visit, she had flow cytology of peripheral blood done on 04/15/23 that showed: T cells with nonspecific changes and no monoclonal B-cell population identified.  Melissa Weaver underwent bilateral MM on 04/15/23 that found: No mammographic evidence of malignancy. He also had CT lung cancer screening on 04/15/23.   Today, she states that she is doing well overall. Her appetite level is at 100%. Her energy level is at 25%.  PAST MEDICAL HISTORY:   Past Medical History: Past Medical History:  Diagnosis Date   Anxiety    Arthritis    Complication of anesthesia    Depression    Family history of adverse reaction to anesthesia    son had postoperative N&V   Family history of colon cancer    Family history of melanoma    Family history of pancreatic cancer    GERD (gastroesophageal reflux disease)    HLD (hyperlipidemia)    HTN (hypertension)    Personal history of radiation therapy    2019 left breast   PONV (postoperative nausea and vomiting)    Seizures (HCC)    had a seizure as child, unknown etiology, no meds though. No seizures since 10 years.    Surgical History: Past Surgical History:  Procedure Laterality Date   BREAST LUMPECTOMY Left 05/11/2017   intermediate grade DCIS, margins negative   PAROTIDECTOMY Right 11/13/2021   Procedure: SUPERFICIAL PAROTIDECTOMY WITH FACIAL NERVE DISSECTION;  Surgeon: Melissa Colonel, MD;  Location: [facility] OR;  Service: ENT;  Laterality: Right;   PARTIAL MASTECTOMY WITH NEEDLE LOCALIZATION Left 05/11/2017   Procedure: PARTIAL MASTECTOMY WITH NEEDLE LOCALIZATION;  Surgeon: Melissa Roers, MD;  Location: [facility] ORS;  Service: General;  Laterality: Left;   TUBAL LIGATION      Social History: Social History   Socioeconomic History   Marital status: Widowed    Spouse  name: Not on file   Number of children: 1   Years of education: Not on file   Highest education level: Not on file  Occupational History   Not on file  Tobacco Use   Smoking status: Every Day    Current packs/day: 1.00    Average packs/day: 1 pack/day for 40.0 years (40.0 ttl pk-yrs)    Types: Cigarettes   Smokeless tobacco: Never   Tobacco comments:    Pt attempts to vape at times to quit smoking but has been unsuccessful  Vaping Use   Vaping status: Some Days  Substance and Sexual Activity   Alcohol use: Never   Drug use: Never   Sexual activity: Yes    Birth control/protection: None  Other Topics Concern   Not on file  Social History Narrative   Not on file   Social Drivers of Corporate investment banker  Strain: Not on file  Food Insecurity: No Food Insecurity (11/13/2021)   Hunger Vital Sign    Worried About Running Out of Food in the Last Year: Never true    Ran Out of Food in the Last Year: Never true  Transportation Needs: No Transportation Needs (11/13/2021)   PRAPARE - Administrator, Civil Service (Medical): No    Lack of Transportation (Non-Medical): No  Physical Activity: Not on file  Stress: Not on file  Social Connections: Not on file  Intimate Partner Violence: Not At Risk (11/13/2021)   Humiliation, Afraid, Rape, and Kick questionnaire    Fear of Current or Ex-Partner: No    Emotionally Abused: No    Physically Abused: No    Sexually Abused: No    Family History: Family History  Problem Relation Age of Onset   Pancreatic cancer Mother 53       d. 79   Atrial fibrillation Father    COPD Father    Hypertension Father    High Cholesterol Father    Pancreatic cancer Maternal Aunt 75       d. 35   Pancreatic cancer Maternal Uncle        dx and died in his 41s   Lung cancer Maternal Grandfather 35       d. 74   Heart attack Maternal Grandfather 62       d. 92   Lung cancer Maternal Aunt    Leukemia Maternal Uncle        dx in his  30s-40s   Heart attack Maternal Uncle    Melanoma Other        MGMs sister   Colon cancer Other        MGMs mother    Current Medications:  Current Outpatient Medications:    acetaminophen (TYLENOL) 650 MG CR tablet, Take 1,300 mg by mouth daily as needed for pain., Disp: , Rfl:    albuterol (VENTOLIN HFA) 108 (90 Base) MCG/ACT inhaler, Inhale 2 puffs into the lungs every 4 (four) hours as needed., Disp: , Rfl:    ALPRAZolam (XANAX) 0.25 MG tablet, Take 0.25 mg by mouth See admin instructions. Take 1 tablet (0.25 mg) by mouth scheduled every morning & may take an additional 2 doses as needed for anxiety., Disp: , Rfl:    atorvastatin (LIPITOR) 10 MG tablet, Take 10 mg by mouth every evening., Disp: , Rfl:    Calcium Carb-Cholecalciferol (CALCIUM 600+D3 PO), Take 1 tablet by mouth in the morning and at bedtime., Disp: , Rfl:    Cholecalciferol (VITAMIN D) 50 MCG (2000 UT) CAPS, Take 2,000 Units by mouth in the morning., Disp: , Rfl:    denosumab (PROLIA) 60 MG/ML SOLN injection, Inject 60 mg into the skin every 6 (six) months. Administer in upper arm, thigh, or abdomen, Disp: , Rfl:    escitalopram (LEXAPRO) 10 MG tablet, Take 10 mg by mouth in the morning., Disp: , Rfl:    ferrous sulfate 325 (65 FE) MG tablet, Take 325 mg by mouth every Sunday., Disp: , Rfl:    GEMTESA 75 MG TABS, Take 1 tablet by mouth daily., Disp: , Rfl:    lisinopril (ZESTRIL) 10 MG tablet, Take by mouth., Disp: , Rfl:    Magnesium 250 MG TABS, Take 250 mg by mouth every evening., Disp: , Rfl:    Omega-3 Fatty Acids (FISH OIL) 1000 MG CAPS, Take 1,000 mg by mouth in the morning and at bedtime., Disp: ,  Rfl:    omeprazole (PRILOSEC) 20 MG capsule, Take 20 mg by mouth daily., Disp: , Rfl:    Powders (ANTI MONKEY BUTT) POWD, Apply 1 Application topically daily as needed (irritation (skin folds--breast area))., Disp: , Rfl:    predniSONE (STERAPRED UNI-PAK 21 TAB) 10 MG (21) TBPK tablet, SMARTSIG:- Tablet(s) By Mouth -,  Disp: , Rfl:    REPATHA SURECLICK 140 MG/ML SOAJ, , Disp: , Rfl:    TRELEGY ELLIPTA 100-62.5-25 MCG/ACT AEPB, Take 1 puff by mouth daily., Disp: , Rfl:    Allergies: No Known Allergies  REVIEW OF SYSTEMS:   Review of Systems  Constitutional:  Negative for chills, fatigue and fever.  HENT:   Negative for lump/mass, mouth sores, nosebleeds, sore throat and trouble swallowing.   Eyes:  Negative for eye problems.  Respiratory:  Positive for cough and shortness of breath.   Cardiovascular:  Negative for chest pain, leg swelling and palpitations.  Gastrointestinal:  Positive for diarrhea. Negative for abdominal pain, constipation, nausea and vomiting.  Genitourinary:  Negative for bladder incontinence, difficulty urinating, dysuria, frequency, hematuria and nocturia.   Musculoskeletal:  Negative for arthralgias, back pain, flank pain, myalgias and neck pain.  Skin:  Negative for itching and rash.  Neurological:  Positive for headaches. Negative for dizziness and numbness.  Hematological:  Does not bruise/bleed easily.  Psychiatric/Behavioral:  Positive for depression. Negative for sleep disturbance and suicidal ideas. The patient is nervous/anxious.   All other systems reviewed and are negative.    VITALS:   Blood pressure (!) 164/69, pulse 82, temperature 97.7 F (36.5 C), temperature source Tympanic, resp. rate 20, weight 201 lb 11.5 oz (91.5 kg), SpO2 90%.  Wt Readings from Last 3 Encounters:  04/27/23 201 lb 11.5 oz (91.5 kg)  04/29/22 190 lb 4.8 oz (86.3 kg)  11/13/21 183 lb (83 kg)    Body mass index is 36.9 kg/m.  Performance status (ECOG): 1 - Symptomatic but completely ambulatory  PHYSICAL EXAM:   Physical Exam Vitals and nursing note reviewed. Exam conducted with a chaperone present.  Constitutional:      Appearance: Normal appearance.  Cardiovascular:     Rate and Rhythm: Normal rate and regular rhythm.     Pulses: Normal pulses.     Heart sounds: Normal heart  sounds.  Pulmonary:     Effort: Pulmonary effort is normal.     Breath sounds: Normal breath sounds.  Abdominal:     Palpations: Abdomen is soft. There is no hepatomegaly, splenomegaly or mass.     Tenderness: There is no abdominal tenderness.  Musculoskeletal:     Right lower leg: No edema.     Left lower leg: No edema.  Lymphadenopathy:     Cervical: No cervical adenopathy.     Right cervical: No superficial, deep or posterior cervical adenopathy.    Left cervical: No superficial, deep or posterior cervical adenopathy.     Upper Body:     Right upper body: No supraclavicular or axillary adenopathy.     Left upper body: No supraclavicular or axillary adenopathy.  Neurological:     General: No focal deficit present.     Mental Status: She is alert and oriented to person, place, and time.  Psychiatric:        Mood and Affect: Mood normal.        Behavior: Behavior normal.     LABS:      Latest Ref Rng & Units 04/15/2023   10:16 AM 04/22/2022  3:08 PM 10/21/2021   10:51 AM  CBC  WBC 4.0 - 10.5 K/uL 11.0  10.6  11.7   Hemoglobin 12.0 - 15.0 g/dL 40.9  81.1  91.4   Hematocrit 36.0 - 46.0 % 43.3  39.6  42.4   Platelets 150 - 400 K/uL 324  284  287       Latest Ref Rng & Units 04/15/2023   10:16 AM 04/22/2022    3:08 PM 10/21/2021   10:51 AM  CMP  Glucose 70 - 99 mg/dL 782  92  956   BUN 8 - 23 mg/dL 16  7  11    Creatinine 0.44 - 1.00 mg/dL 2.13  0.86  5.78   Sodium 135 - 145 mmol/L 137  135  137   Potassium 3.5 - 5.1 mmol/L 4.8  3.8  4.4   Chloride 98 - 111 mmol/L 99  101  101   CO2 22 - 32 mmol/L 27  26  29    Calcium 8.9 - 10.3 mg/dL 46.9  8.7  9.5   Total Protein 6.5 - 8.1 g/dL 7.7  6.9  7.4   Total Bilirubin 0.0 - 1.2 mg/dL 0.6  0.4  0.4   Alkaline Phos 38 - 126 U/L 79  61  52   AST 15 - 41 U/L 25  15  13    ALT 0 - 44 U/L 30  17  13       No results found for: "CEA1", "CEA" / No results found for: "CEA1", "CEA" No results found for: "PSA1" No results found for:  "GEX528" No results found for: "CAN125"  No results found for: "TOTALPROTELP", "ALBUMINELP", "A1GS", "A2GS", "BETS", "BETA2SER", "GAMS", "MSPIKE", "SPEI" No results found for: "TIBC", "FERRITIN", "IRONPCTSAT" Lab Results  Component Value Date   LDH 165 04/22/2021   LDH 166 10/20/2020   LDH 151 04/01/2020     STUDIES:   MM 3D SCREENING MAMMOGRAM BILATERAL BREAST Result Date: 04/19/2023 CLINICAL DATA:  Screening. EXAM: DIGITAL SCREENING BILATERAL MAMMOGRAM WITH TOMOSYNTHESIS AND CAD TECHNIQUE: Bilateral screening digital craniocaudal and mediolateral oblique mammograms were obtained. Bilateral screening digital breast tomosynthesis was performed. The images were evaluated with computer-aided detection. COMPARISON:  Previous exam(s). ACR Breast Density Category b: There are scattered areas of fibroglandular density. FINDINGS: There are no findings suspicious for malignancy. IMPRESSION: No mammographic evidence of malignancy. A result letter of this screening mammogram will be mailed directly to the patient. RECOMMENDATION: Screening mammogram in one year. (Code:[id number]-B-01Y) BI-RADS CATEGORY  1: Negative. Electronically Signed   By: Emmaline Kluver M.D.   On: 04/19/2023 08:46

## 2023-04-27 ENCOUNTER — Inpatient Hospital Stay: Payer: PPO | Attending: Hematology | Admitting: Hematology

## 2023-04-27 ENCOUNTER — Encounter: Attending: Internal Medicine | Admitting: Emergency Medicine

## 2023-04-27 VITALS — BP 164/69 | HR 82 | Temp 97.7°F | Resp 20 | Wt 201.7 lb

## 2023-04-27 VITALS — BP 141/67 | HR 80 | Temp 98.0°F | Resp 17

## 2023-04-27 DIAGNOSIS — Z79899 Other long term (current) drug therapy: Secondary | ICD-10-CM | POA: Diagnosis not present

## 2023-04-27 DIAGNOSIS — D72829 Elevated white blood cell count, unspecified: Secondary | ICD-10-CM | POA: Diagnosis not present

## 2023-04-27 DIAGNOSIS — F17210 Nicotine dependence, cigarettes, uncomplicated: Secondary | ICD-10-CM | POA: Diagnosis not present

## 2023-04-27 DIAGNOSIS — Z86000 Personal history of in-situ neoplasm of breast: Secondary | ICD-10-CM | POA: Insufficient documentation

## 2023-04-27 DIAGNOSIS — M810 Age-related osteoporosis without current pathological fracture: Secondary | ICD-10-CM | POA: Diagnosis not present

## 2023-04-27 DIAGNOSIS — Z87891 Personal history of nicotine dependence: Secondary | ICD-10-CM

## 2023-04-27 DIAGNOSIS — E785 Hyperlipidemia, unspecified: Secondary | ICD-10-CM | POA: Diagnosis not present

## 2023-04-27 DIAGNOSIS — D0512 Intraductal carcinoma in situ of left breast: Secondary | ICD-10-CM

## 2023-04-27 DIAGNOSIS — Z923 Personal history of irradiation: Secondary | ICD-10-CM | POA: Insufficient documentation

## 2023-04-27 MED ORDER — INCLISIRAN SODIUM 284 MG/1.5ML ~~LOC~~ SOSY
284.0000 mg | PREFILLED_SYRINGE | Freq: Once | SUBCUTANEOUS | Status: AC
  Administered 2023-04-27: 284 mg via SUBCUTANEOUS

## 2023-04-27 NOTE — Patient Instructions (Signed)
 [facility] at [facility] Discharge Instructions   You were seen and examined today by Dr. Ellin Saba.  He reviewed the results of your lab work which are normal/stable.   We will see you back in one year. We will repeat lab work and a CT prior to this visit.  Return as scheduled.    Thank you for choosing [facility] at [facility] to provide your oncology and hematology care.  To afford each patient quality time with our provider, please arrive at least 15 minutes before your scheduled appointment time.   If you have a lab appointment with the Cancer Center please come in thru the Main Entrance and check in at the main information desk.  You need to re-schedule your appointment should you arrive 10 or more minutes late.  We strive to give you quality time with our providers, and arriving late affects you and other patients whose appointments are after yours.  Also, if you no show three or more times for appointments you may be dismissed from the clinic at the providers discretion.     Again, thank you for choosing [facility].  Our hope is that these requests will decrease the amount of time that you wait before being seen by our physicians.       _____________________________________________________________  Should you have questions after your visit to [facility], please contact our office at [phone] and follow the prompts.  Our office hours are 8:00 a.m. and 4:30 p.m. Monday - Friday.  Please note that voicemails left after 4:00 p.m. may not be returned until the following business day.  We are closed weekends and major holidays.  You do have access to a nurse 24-7, just call the main number to the clinic [phone] and do not press any options, hold on the line and a nurse will answer the phone.    For prescription refill requests, have your pharmacy contact our office and allow 72 hours.    Due to Covid,  you will need to wear a mask upon entering the hospital. If you do not have a mask, a mask will be given to you at the Main Entrance upon arrival. For doctor visits, patients may have 1 support person age 10 or older with them. For treatment visits, patients can not have anyone with them due to social distancing guidelines and our immunocompromised population.

## 2023-04-27 NOTE — Progress Notes (Signed)
 Diagnosis: Hyperlipidemia  Provider:  Dwana Melena MD  Procedure: Injection  Leqvio (inclisiran), Dose: 284 mg, Site: subcutaneous, Number of injections: 1  Injection Site(s): Right arm  Post Care: Observation period completed  Discharge: Condition: Good, Destination: Home . AVS Provided  Performed by:  Arrie Senate, RN

## 2023-04-28 NOTE — Progress Notes (Signed)
 Results reviewed by Dr. Ellin Saba and patient was notified.  Will follow up as planned.  Verbalized understanding.

## 2023-04-29 NOTE — Progress Notes (Signed)
 Patient notified of LDCT Lung Cancer Screening Results via mail with the recommendation to follow-up in 12 months. Patient's referring provider has been sent a copy of results. Results are as follows:  IMPRESSION: 1. Lung-RADS 1, negative. Continue annual screening with low-dose chest CT without contrast in 12 months. 2. Hepatic steatosis. 3. Left adrenal adenoma. 4. Aortic atherosclerosis (ICD10-I70.0). Coronary artery calcification. 5.  Emphysema (ICD10-J43.9).

## 2023-05-04 DIAGNOSIS — E041 Nontoxic single thyroid nodule: Secondary | ICD-10-CM | POA: Diagnosis not present

## 2023-05-04 DIAGNOSIS — R7303 Prediabetes: Secondary | ICD-10-CM | POA: Diagnosis not present

## 2023-05-04 DIAGNOSIS — E785 Hyperlipidemia, unspecified: Secondary | ICD-10-CM | POA: Diagnosis not present

## 2023-05-10 DIAGNOSIS — R52 Pain, unspecified: Secondary | ICD-10-CM | POA: Diagnosis not present

## 2023-05-10 DIAGNOSIS — J439 Emphysema, unspecified: Secondary | ICD-10-CM | POA: Diagnosis not present

## 2023-05-10 DIAGNOSIS — K219 Gastro-esophageal reflux disease without esophagitis: Secondary | ICD-10-CM | POA: Diagnosis not present

## 2023-05-10 DIAGNOSIS — F17210 Nicotine dependence, cigarettes, uncomplicated: Secondary | ICD-10-CM | POA: Diagnosis not present

## 2023-05-10 DIAGNOSIS — I700 Atherosclerosis of aorta: Secondary | ICD-10-CM | POA: Diagnosis not present

## 2023-05-10 DIAGNOSIS — E785 Hyperlipidemia, unspecified: Secondary | ICD-10-CM | POA: Diagnosis not present

## 2023-05-10 DIAGNOSIS — E041 Nontoxic single thyroid nodule: Secondary | ICD-10-CM | POA: Diagnosis not present

## 2023-05-10 DIAGNOSIS — N3281 Overactive bladder: Secondary | ICD-10-CM | POA: Diagnosis not present

## 2023-05-10 DIAGNOSIS — M545 Low back pain, unspecified: Secondary | ICD-10-CM | POA: Diagnosis not present

## 2023-05-10 DIAGNOSIS — R7303 Prediabetes: Secondary | ICD-10-CM | POA: Diagnosis not present

## 2023-05-10 DIAGNOSIS — Z853 Personal history of malignant neoplasm of breast: Secondary | ICD-10-CM | POA: Diagnosis not present

## 2023-05-10 DIAGNOSIS — D72829 Elevated white blood cell count, unspecified: Secondary | ICD-10-CM | POA: Diagnosis not present

## 2023-05-10 DIAGNOSIS — R32 Unspecified urinary incontinence: Secondary | ICD-10-CM | POA: Diagnosis not present

## 2023-05-16 DIAGNOSIS — Z6837 Body mass index (BMI) 37.0-37.9, adult: Secondary | ICD-10-CM | POA: Diagnosis not present

## 2023-05-16 DIAGNOSIS — M48061 Spinal stenosis, lumbar region without neurogenic claudication: Secondary | ICD-10-CM | POA: Diagnosis not present

## 2023-05-17 ENCOUNTER — Other Ambulatory Visit (HOSPITAL_COMMUNITY): Payer: Self-pay | Admitting: Neurosurgery

## 2023-05-17 DIAGNOSIS — M48061 Spinal stenosis, lumbar region without neurogenic claudication: Secondary | ICD-10-CM

## 2023-05-26 ENCOUNTER — Ambulatory Visit (HOSPITAL_COMMUNITY)
Admission: RE | Admit: 2023-05-26 | Discharge: 2023-05-26 | Disposition: A | Discharge status: Home / Self Care | Source: Ambulatory Visit | Attending: Neurosurgery | Admitting: Neurosurgery

## 2023-05-26 DIAGNOSIS — M5126 Other intervertebral disc displacement, lumbar region: Secondary | ICD-10-CM | POA: Diagnosis not present

## 2023-05-26 DIAGNOSIS — M5136 Other intervertebral disc degeneration, lumbar region with discogenic back pain only: Secondary | ICD-10-CM | POA: Diagnosis not present

## 2023-05-26 DIAGNOSIS — M48061 Spinal stenosis, lumbar region without neurogenic claudication: Secondary | ICD-10-CM | POA: Insufficient documentation

## 2023-05-26 DIAGNOSIS — M4316 Spondylolisthesis, lumbar region: Secondary | ICD-10-CM | POA: Diagnosis not present

## 2023-05-26 DIAGNOSIS — M5127 Other intervertebral disc displacement, lumbosacral region: Secondary | ICD-10-CM | POA: Diagnosis not present

## 2023-05-27 DIAGNOSIS — R600 Localized edema: Secondary | ICD-10-CM | POA: Diagnosis not present

## 2023-05-27 DIAGNOSIS — D72829 Elevated white blood cell count, unspecified: Secondary | ICD-10-CM | POA: Diagnosis not present

## 2023-05-31 DIAGNOSIS — M48061 Spinal stenosis, lumbar region without neurogenic claudication: Secondary | ICD-10-CM | POA: Diagnosis not present

## 2023-05-31 DIAGNOSIS — Z6836 Body mass index (BMI) 36.0-36.9, adult: Secondary | ICD-10-CM | POA: Diagnosis not present

## 2023-06-10 ENCOUNTER — Other Ambulatory Visit (HOSPITAL_COMMUNITY): Payer: Self-pay | Admitting: Family Medicine

## 2023-06-10 DIAGNOSIS — R600 Localized edema: Secondary | ICD-10-CM

## 2023-06-14 NOTE — Progress Notes (Unsigned)
 GI Office Note    Referring Provider: Omie Bickers, MD Primary Care Physician:  Omie Bickers, MD  Primary Gastroenterologist:  Chief Complaint   No chief complaint on file.    History of Present Illness   Melissa Weaver is a 73 y.o. female presenting today at the request of Wendi Ham, NP for generalized abdominal pain.    Labs ***        Medications   Current Outpatient Medications  Medication Sig Dispense Refill   acetaminophen  (TYLENOL ) 650 MG CR tablet Take 1,300 mg by mouth daily as needed for pain.     albuterol (VENTOLIN HFA) 108 (90 Base) MCG/ACT inhaler Inhale 2 puffs into the lungs every 4 (four) hours as needed.     ALPRAZolam  (XANAX ) 0.25 MG tablet Take 0.25 mg by mouth See admin instructions. Take 1 tablet (0.25 mg) by mouth scheduled every morning & may take an additional 2 doses as needed for anxiety.     atorvastatin  (LIPITOR) 10 MG tablet Take 10 mg by mouth every evening.     Calcium  Carb-Cholecalciferol  (CALCIUM  600+D3 PO) Take 1 tablet by mouth in the morning and at bedtime.     Cholecalciferol  (VITAMIN D ) 50 MCG (2000 UT) CAPS Take 2,000 Units by mouth in the morning.     denosumab  (PROLIA ) 60 MG/ML SOLN injection Inject 60 mg into the skin every 6 (six) months. Administer in upper arm, thigh, or abdomen     escitalopram  (LEXAPRO ) 10 MG tablet Take 10 mg by mouth in the morning.     ferrous sulfate  325 (65 FE) MG tablet Take 325 mg by mouth every Sunday.     GEMTESA 75 MG TABS Take 1 tablet by mouth daily.     lisinopril (ZESTRIL) 10 MG tablet Take by mouth.     Magnesium  250 MG TABS Take 250 mg by mouth every evening.     Omega-3 Fatty Acids (FISH OIL) 1000 MG CAPS Take 1,000 mg by mouth in the morning and at bedtime.     omeprazole (PRILOSEC) 20 MG capsule Take 20 mg by mouth daily.     Powders (ANTI MONKEY BUTT) POWD Apply 1 Application topically daily as needed (irritation (skin folds--breast area)).     predniSONE (STERAPRED UNI-PAK  21 TAB) 10 MG (21) TBPK tablet SMARTSIG:- Tablet(s) By Mouth -     REPATHA SURECLICK 140 MG/ML SOAJ      TRELEGY ELLIPTA 100-62.5-25 MCG/ACT AEPB Take 1 puff by mouth daily.     No current facility-administered medications for this visit.    Allergies   Allergies as of 06/15/2023   (No Known Allergies)    Past Medical History   Past Medical History:  Diagnosis Date   Anxiety    Arthritis    Complication of anesthesia    Depression    Family history of adverse reaction to anesthesia    son had postoperative N&V   Family history of colon cancer    Family history of melanoma    Family history of pancreatic cancer    GERD (gastroesophageal reflux disease)    HLD (hyperlipidemia)    HTN (hypertension)    Personal history of radiation therapy    2019 left breast   PONV (postoperative nausea and vomiting)    Seizures (HCC)    had a seizure as child, unknown etiology, no meds though. No seizures since 10 years.    Past Surgical History   Past Surgical History:  Procedure Laterality Date   BREAST LUMPECTOMY Left 05/11/2017   intermediate grade DCIS, margins negative   PAROTIDECTOMY Right 11/13/2021   Procedure: SUPERFICIAL PAROTIDECTOMY WITH FACIAL NERVE DISSECTION;  Surgeon: Janita Mellow, MD;  Location: [facility] OR;  Service: ENT;  Laterality: Right;   PARTIAL MASTECTOMY WITH NEEDLE LOCALIZATION Left 05/11/2017   Procedure: PARTIAL MASTECTOMY WITH NEEDLE LOCALIZATION;  Surgeon: Awilda Bogus, MD;  Location: [facility] ORS;  Service: General;  Laterality: Left;   TUBAL LIGATION      Past Family History   Family History  Problem Relation Age of Onset   Pancreatic cancer Mother 41       d. 67   Atrial fibrillation Father    COPD Father    Hypertension Father    High Cholesterol Father    Pancreatic cancer Maternal Aunt 75       d. 8   Pancreatic cancer Maternal Uncle        dx and died in his 12s   Lung cancer Maternal Grandfather 24       d. 92   Heart attack Maternal  Grandfather 73       d. 92   Lung cancer Maternal Aunt    Leukemia Maternal Uncle        dx in his 30s-40s   Heart attack Maternal Uncle    Melanoma Other        MGMs sister   Colon cancer Other        MGMs mother    Past Social History   Social History   Socioeconomic History   Marital status: Widowed    Spouse name: Not on file   Number of children: 1   Years of education: Not on file   Highest education level: Not on file  Occupational History   Not on file  Tobacco Use   Smoking status: Every Day    Current packs/day: 1.00    Average packs/day: 1 pack/day for 40.0 years (40.0 ttl pk-yrs)    Types: Cigarettes   Smokeless tobacco: Never   Tobacco comments:    Pt attempts to vape at times to quit smoking but has been unsuccessful  Vaping Use   Vaping status: Some Days  Substance and Sexual Activity   Alcohol  use: Never   Drug use: Never   Sexual activity: Yes    Birth control/protection: None  Other Topics Concern   Not on file  Social History Narrative   Not on file   Social Drivers of Health   Financial Resource Strain: Not on file  Food Insecurity: No Food Insecurity (11/13/2021)   Hunger Vital Sign    Worried About Running Out of Food in the Last Year: Never true    Ran Out of Food in the Last Year: Never true  Transportation Needs: No Transportation Needs (11/13/2021)   PRAPARE - Administrator, Civil Service (Medical): No    Lack of Transportation (Non-Medical): No  Physical Activity: Not on file  Stress: Not on file  Social Connections: Not on file  Intimate Partner Violence: Not At Risk (11/13/2021)   Humiliation, Afraid, Rape, and Kick questionnaire    Fear of Current or Ex-Partner: No    Emotionally Abused: No    Physically Abused: No    Sexually Abused: No    Review of Systems   General: Negative for anorexia, weight loss, fever, chills, fatigue, weakness. Eyes: Negative for vision changes.  ENT: Negative for hoarseness,  difficulty swallowing ,  nasal congestion. CV: Negative for chest pain, angina, palpitations, dyspnea on exertion, peripheral edema.  Respiratory: Negative for dyspnea at rest, dyspnea on exertion, cough, sputum, wheezing.  GI: See history of present illness. GU:  Negative for dysuria, hematuria, urinary incontinence, urinary frequency, nocturnal urination.  MS: Negative for joint pain, low back pain.  Derm: Negative for rash or itching.  Neuro: Negative for weakness, abnormal sensation, seizure, frequent headaches, memory loss,  confusion.  Psych: Negative for anxiety, depression, suicidal ideation, hallucinations.  Endo: Negative for unusual weight change.  Heme: Negative for bruising or bleeding. Allergy: Negative for rash or hives.  Physical Exam   There were no vitals taken for this visit.   General: Well-nourished, well-developed in no acute distress.  Head: Normocephalic, atraumatic.   Eyes: Conjunctiva pink, no icterus. Mouth: Oropharyngeal mucosa moist and pink , no lesions erythema or exudate. Neck: Supple without thyromegaly, masses, or lymphadenopathy.  Lungs: Clear to auscultation bilaterally.  Heart: Regular rate and rhythm, no murmurs rubs or gallops.  Abdomen: Bowel sounds are normal, nontender, nondistended, no hepatosplenomegaly or masses,  no abdominal bruits or hernia, no rebound or guarding.   Rectal: *** Extremities: No lower extremity edema. No clubbing or deformities.  Neuro: Alert and oriented x 4 , grossly normal neurologically.  Skin: Warm and dry, no rash or jaundice.   Psych: Alert and cooperative, normal mood and affect.  Labs   Lab Results  Component Value Date   NA 137 04/15/2023   CL 99 04/15/2023   K 4.8 04/15/2023   CO2 27 04/15/2023   BUN 16 04/15/2023   CREATININE 0.81 04/15/2023   GFRNONAA >60 04/15/2023   CALCIUM  10.0 04/15/2023   ALBUMIN 3.9 04/15/2023   GLUCOSE 134 (H) 04/15/2023   Lab Results  Component Value Date   WBC 11.0  (H) 04/15/2023   HGB 13.5 04/15/2023   HCT 43.3 04/15/2023   MCV 88.7 04/15/2023   PLT 324 04/15/2023   Lab Results  Component Value Date   ALT 30 04/15/2023   AST 25 04/15/2023   ALKPHOS 79 04/15/2023   BILITOT 0.6 04/15/2023    Imaging Studies   No results found.  Assessment       PLAN   ***   Trudie Fuse. Harles Lied, MHS, PA-C [facility]

## 2023-06-15 ENCOUNTER — Encounter: Payer: Self-pay | Admitting: Gastroenterology

## 2023-06-15 ENCOUNTER — Encounter: Payer: Self-pay | Admitting: *Deleted

## 2023-06-15 ENCOUNTER — Ambulatory Visit: Admitting: Gastroenterology

## 2023-06-15 VITALS — BP 125/77 | HR 92 | Temp 98.9°F | Ht 62.0 in | Wt 203.6 lb

## 2023-06-15 DIAGNOSIS — K625 Hemorrhage of anus and rectum: Secondary | ICD-10-CM | POA: Diagnosis not present

## 2023-06-15 DIAGNOSIS — R1310 Dysphagia, unspecified: Secondary | ICD-10-CM | POA: Insufficient documentation

## 2023-06-15 DIAGNOSIS — R1013 Epigastric pain: Secondary | ICD-10-CM

## 2023-06-15 DIAGNOSIS — R1319 Other dysphagia: Secondary | ICD-10-CM

## 2023-06-15 DIAGNOSIS — Z800 Family history of malignant neoplasm of digestive organs: Secondary | ICD-10-CM | POA: Diagnosis not present

## 2023-06-15 DIAGNOSIS — K219 Gastro-esophageal reflux disease without esophagitis: Secondary | ICD-10-CM | POA: Insufficient documentation

## 2023-06-15 DIAGNOSIS — R1030 Lower abdominal pain, unspecified: Secondary | ICD-10-CM | POA: Insufficient documentation

## 2023-06-15 NOTE — Patient Instructions (Signed)
 CT scan of your abdomen/pelvis.   Once results are available, we will reach out with further instructions. Anticipate need for colonoscopy and upper endoscopy.

## 2023-06-17 ENCOUNTER — Ambulatory Visit (HOSPITAL_COMMUNITY)
Admission: RE | Admit: 2023-06-17 | Discharge: 2023-06-17 | Disposition: A | Discharge status: Home / Self Care | Source: Ambulatory Visit | Attending: Family Medicine | Admitting: Family Medicine

## 2023-06-17 DIAGNOSIS — R600 Localized edema: Secondary | ICD-10-CM | POA: Diagnosis not present

## 2023-06-17 DIAGNOSIS — E785 Hyperlipidemia, unspecified: Secondary | ICD-10-CM | POA: Diagnosis not present

## 2023-06-23 DIAGNOSIS — R102 Pelvic and perineal pain: Secondary | ICD-10-CM | POA: Diagnosis not present

## 2023-06-23 DIAGNOSIS — R350 Frequency of micturition: Secondary | ICD-10-CM | POA: Diagnosis not present

## 2023-06-29 DIAGNOSIS — I517 Cardiomegaly: Secondary | ICD-10-CM | POA: Diagnosis not present

## 2023-06-29 DIAGNOSIS — R600 Localized edema: Secondary | ICD-10-CM | POA: Diagnosis not present

## 2023-06-29 DIAGNOSIS — D508 Other iron deficiency anemias: Secondary | ICD-10-CM | POA: Diagnosis not present

## 2023-06-29 DIAGNOSIS — M48062 Spinal stenosis, lumbar region with neurogenic claudication: Secondary | ICD-10-CM | POA: Diagnosis not present

## 2023-06-29 DIAGNOSIS — R7303 Prediabetes: Secondary | ICD-10-CM | POA: Diagnosis not present

## 2023-06-29 DIAGNOSIS — J432 Centrilobular emphysema: Secondary | ICD-10-CM | POA: Diagnosis not present

## 2023-06-29 DIAGNOSIS — R1084 Generalized abdominal pain: Secondary | ICD-10-CM | POA: Diagnosis not present

## 2023-06-29 DIAGNOSIS — F411 Generalized anxiety disorder: Secondary | ICD-10-CM | POA: Diagnosis not present

## 2023-06-29 DIAGNOSIS — Z1330 Encounter for screening examination for mental health and behavioral disorders, unspecified: Secondary | ICD-10-CM | POA: Diagnosis not present

## 2023-06-29 DIAGNOSIS — I700 Atherosclerosis of aorta: Secondary | ICD-10-CM | POA: Diagnosis not present

## 2023-06-29 DIAGNOSIS — E538 Deficiency of other specified B group vitamins: Secondary | ICD-10-CM | POA: Diagnosis not present

## 2023-06-30 ENCOUNTER — Encounter: Payer: PPO | Attending: Internal Medicine | Admitting: Emergency Medicine

## 2023-06-30 VITALS — BP 99/65 | HR 96 | Temp 98.0°F | Resp 18

## 2023-06-30 DIAGNOSIS — M810 Age-related osteoporosis without current pathological fracture: Secondary | ICD-10-CM | POA: Diagnosis not present

## 2023-06-30 MED ORDER — DENOSUMAB 60 MG/ML ~~LOC~~ SOSY
60.0000 mg | PREFILLED_SYRINGE | Freq: Once | SUBCUTANEOUS | Status: AC
  Administered 2023-06-30: 60 mg via SUBCUTANEOUS

## 2023-06-30 NOTE — Progress Notes (Signed)
 Diagnosis: Osteoporosis  Provider:  Izetta Marshall MD  Procedure: Injection  Prolia  (Denosumab ), Dose: 60 mg, Site: subcutaneous, Number of injections: 1  Injection Site(s): Right arm  Post Care: Patient declined observation  Discharge: Condition: Good, Destination: Home . AVS Declined  Performed by:  Arlina Benjamin, RN

## 2023-07-01 DIAGNOSIS — R1084 Generalized abdominal pain: Secondary | ICD-10-CM | POA: Diagnosis not present

## 2023-07-01 DIAGNOSIS — Z800 Family history of malignant neoplasm of digestive organs: Secondary | ICD-10-CM | POA: Diagnosis not present

## 2023-07-01 DIAGNOSIS — K760 Fatty (change of) liver, not elsewhere classified: Secondary | ICD-10-CM | POA: Diagnosis not present

## 2023-07-01 DIAGNOSIS — E278 Other specified disorders of adrenal gland: Secondary | ICD-10-CM | POA: Diagnosis not present

## 2023-07-04 ENCOUNTER — Telehealth: Payer: Self-pay | Admitting: *Deleted

## 2023-07-04 DIAGNOSIS — M5136 Other intervertebral disc degeneration, lumbar region with discogenic back pain only: Secondary | ICD-10-CM | POA: Diagnosis not present

## 2023-07-04 DIAGNOSIS — M47816 Spondylosis without myelopathy or radiculopathy, lumbar region: Secondary | ICD-10-CM | POA: Diagnosis not present

## 2023-07-04 DIAGNOSIS — G894 Chronic pain syndrome: Secondary | ICD-10-CM | POA: Diagnosis not present

## 2023-07-04 DIAGNOSIS — M48061 Spinal stenosis, lumbar region without neurogenic claudication: Secondary | ICD-10-CM | POA: Diagnosis not present

## 2023-07-04 NOTE — Telephone Encounter (Signed)
 Melissa Weaver left a VM that patient had her CT done by PCP on Friday ([facility]). She needs CT we scheduled cancelled.   Called sister and advised it was cancelled but she will need to have her PCP also send results as not available in care everywhere yet. FYI

## 2023-07-05 NOTE — Telephone Encounter (Signed)
 Noted

## 2023-07-08 DIAGNOSIS — E278 Other specified disorders of adrenal gland: Secondary | ICD-10-CM | POA: Diagnosis not present

## 2023-07-08 NOTE — Telephone Encounter (Signed)
 CT seen in CareEverywhere:  CT A/P with IV and PO contrast 07/01/23:  IMPRESSION:  Left adrenal nodule measuring 2.3 cm. Consider biochemical assays to determine functional status and exclude pheochromocytoma. Recommend further evaluation with adrenal protocol CT, although chemical-shift MRI may be considered.  Hepatic steatosis.  Otherwise unremarkable CT of the abdomen and pelvis.    Nothing to explain her epigastric or lower abdominal pain. I suspect lower abdominal discomfort could be gyn related or from her back and she was seeing specialists for both issues.   -->recommend proceeding with colonoscopy  for FH CRC, rectal bleeding -->recommend EGD/ED for dysphagia, epigastric pain -->recommend referral to endocrinology regarding left adrenal nodule, I suspect this will be benign but we need endocrinology to see her and work it up (it looks like her PCP is handling, if so that is fine too)  EGD/ED/TCS with Mordechai April or Rourk ASA 2 Hold iron 7 days Needs BMET

## 2023-07-11 NOTE — Telephone Encounter (Signed)
 Lmom for pt to return call

## 2023-07-12 NOTE — Telephone Encounter (Signed)
 Lmom for pt to return call

## 2023-07-13 NOTE — Telephone Encounter (Signed)
 Pt was made aware and is agreeable with scheduling procedures and is ready to move forward with scheduling. Pt states that she is having more imaging done tomorrow for the adrenal nodule.

## 2023-07-14 ENCOUNTER — Other Ambulatory Visit: Payer: Self-pay | Admitting: *Deleted

## 2023-07-14 ENCOUNTER — Encounter: Payer: Self-pay | Admitting: *Deleted

## 2023-07-14 DIAGNOSIS — E279 Disorder of adrenal gland, unspecified: Secondary | ICD-10-CM | POA: Diagnosis not present

## 2023-07-14 DIAGNOSIS — R1030 Lower abdominal pain, unspecified: Secondary | ICD-10-CM

## 2023-07-14 DIAGNOSIS — K625 Hemorrhage of anus and rectum: Secondary | ICD-10-CM

## 2023-07-14 DIAGNOSIS — K862 Cyst of pancreas: Secondary | ICD-10-CM | POA: Diagnosis not present

## 2023-07-14 DIAGNOSIS — R1319 Other dysphagia: Secondary | ICD-10-CM

## 2023-07-14 DIAGNOSIS — K760 Fatty (change of) liver, not elsewhere classified: Secondary | ICD-10-CM | POA: Diagnosis not present

## 2023-07-14 DIAGNOSIS — Z800 Family history of malignant neoplasm of digestive organs: Secondary | ICD-10-CM

## 2023-07-14 DIAGNOSIS — E278 Other specified disorders of adrenal gland: Secondary | ICD-10-CM | POA: Diagnosis not present

## 2023-07-14 MED ORDER — PEG 3350-KCL-NA BICARB-NACL 420 G PO SOLR: 4000.0000 mL | Freq: Once | ORAL | 0 refills | Status: AC

## 2023-07-14 NOTE — Telephone Encounter (Signed)
 Pt has been scheduled for 08/16/23 with Dr.Carver. instructions mailed and prep sent to pharmacy.

## 2023-07-14 NOTE — Addendum Note (Signed)
 Addended by: GAYLENE MADELIN CROME on: 07/14/2023 10:25 AM   Modules accepted: Orders

## 2023-07-14 NOTE — Progress Notes (Signed)
 error

## 2023-07-14 NOTE — Telephone Encounter (Signed)
 LMOVM to return call  Pt left vm returning call

## 2023-07-14 NOTE — Addendum Note (Signed)
 Addended by: GAYLENE MADELIN CROME on: 07/14/2023 10:16 AM   Modules accepted: Orders

## 2023-07-14 NOTE — Telephone Encounter (Signed)
 LMOVM to return call.

## 2023-07-18 ENCOUNTER — Ambulatory Visit: Admitting: Internal Medicine

## 2023-07-22 NOTE — Telephone Encounter (Signed)
 Melissa Weaver, I reviewed the MRI she had done for the adrenal lesion. 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 She needs MRI abd with and without contrast in 2 years for surveillance. If she wants us  to handle this, please NIC.

## 2023-07-23 ENCOUNTER — Ambulatory Visit
Admission: EM | Admit: 2023-07-23 | Discharge: 2023-07-23 | Disposition: A | Discharge status: Home / Self Care | Attending: Nurse Practitioner | Admitting: Nurse Practitioner

## 2023-07-23 DIAGNOSIS — S70361A Insect bite (nonvenomous), right thigh, initial encounter: Secondary | ICD-10-CM

## 2023-07-23 DIAGNOSIS — L282 Other prurigo: Secondary | ICD-10-CM

## 2023-07-23 DIAGNOSIS — W57XXXA Bitten or stung by nonvenomous insect and other nonvenomous arthropods, initial encounter: Secondary | ICD-10-CM

## 2023-07-23 MED ORDER — DOXYCYCLINE HYCLATE 100 MG PO TABS
200.0000 mg | ORAL_TABLET | Freq: Once | ORAL | 0 refills | Status: AC
Start: 2023-07-23 — End: 2023-07-23

## 2023-07-23 MED ORDER — TRIAMCINOLONE ACETONIDE 0.1 % EX CREA: 1.0000 | TOPICAL_CREAM | Freq: Two times a day (BID) | CUTANEOUS | 0 refills | Status: DC

## 2023-07-23 NOTE — Discharge Instructions (Signed)
 Take medication as prescribed.  I have decided to give you a prophylactic dose of doxycycline  for prevention of Lyme disease. You may take over-the-counter Zyrtec during the daytime and Benadryl at bedtime to help with itching. Avoid scratching or manipulating the areas while symptoms persist. Apply cool compresses to the area to help with itching and inflammation. Avoid hot baths or showers while symptoms persist.  Recommend lukewarm baths or showers to prevent inflammation or itching of the sites. Continue to monitor the areas for a bull's-eye appearing rash, fever, headache, severe fatigue, joint pain, joint swelling, nausea, vomiting, or other concerns.  If you develop any of the symptoms, please seek care immediately. Follow-up as needed.

## 2023-07-23 NOTE — ED Triage Notes (Signed)
 Pt reports she got bit by 2 ticks on her right inner thigh x 1 week

## 2023-07-23 NOTE — ED Provider Notes (Signed)
 [facility]    CSN: [id number] Arrival date & time: 07/23/23  1201      History   Chief Complaint Chief Complaint  Patient presents with   Insect Bite    HPI Melissa Weaver is a 73 y.o. female.   The history is provided by the patient.   Patient presents for complaints of 2 tick bites to the right inner thigh.  Symptoms occurred over 1 week ago.  States that the areas are itchy.  She denies fever, chills, headache, fatigue, joint pain, joint swelling, nausea, vomiting, or dizziness.  States that the areas have become more increasingly itchy.  Past Medical History:  Diagnosis Date   Anxiety    Arthritis    Complication of anesthesia    Depression    Family history of adverse reaction to anesthesia    son had postoperative N&V   Family history of colon cancer    Family history of melanoma    Family history of pancreatic cancer    GERD (gastroesophageal reflux disease)    HLD (hyperlipidemia)    HTN (hypertension)    Melanoma (HCC)    Personal history of radiation therapy    2019 left breast   PONV (postoperative nausea and vomiting)    Seizures (HCC)    had a seizure as child, unknown etiology, no meds though. No seizures since 10 years.    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   HLD (hyperlipidemia) 04/07/2017   Anxiety state 04/07/2017   Depression 04/07/2017   HTN (hypertension) 04/07/2017    Past Surgical History:  Procedure Laterality Date   BREAST LUMPECTOMY Left 05/11/2017   intermediate grade DCIS, margins negative   PAROTIDECTOMY Right 11/13/2021   Procedure: SUPERFICIAL  PAROTIDECTOMY WITH FACIAL NERVE DISSECTION;  Surgeon: Jesus Oliphant, MD;  Location: [facility] OR;  Service: ENT;  Laterality: Right;   PARTIAL MASTECTOMY WITH NEEDLE LOCALIZATION Left 05/11/2017   Procedure: PARTIAL MASTECTOMY WITH NEEDLE LOCALIZATION;  Surgeon: Kallie Manuelita BROCKS, MD;  Location: [facility];  Service: General;  Laterality: Left;   TUBAL LIGATION      OB History   No obstetric history on file.      Home Medications    Prior to Admission medications   Medication Sig Start Date End Date Taking? Authorizing Provider  acetaminophen  (TYLENOL ) 650 MG CR tablet Take 1,300 mg by mouth daily as needed for pain.    [provider]  albuterol (VENTOLIN HFA) 108 (90 Base) MCG/ACT inhaler Inhale 2 puffs into the lungs every 4 (four) hours as needed. 03/11/22   [provider]  ALPRAZolam  (XANAX ) 0.25 MG tablet Take 0.25 mg by mouth See admin instructions. Take 1 tablet (0.25 mg) by mouth scheduled every morning & may take an additional 2 doses as needed for anxiety.    [provider]  atorvastatin  (LIPITOR) 10 MG tablet Take 10 mg by mouth every evening. 09/05/18   [provider]  Calcium  Carb-Cholecalciferol  (CALCIUM  600+D3 PO) Take 1 tablet by mouth in the morning and at bedtime.    [provider]  Cholecalciferol  125 MCG (5000  UT) capsule Take 5,000 Units by mouth daily.    [provider]  denosumab  (PROLIA ) 60 MG/ML SOLN injection Inject 60 mg into the skin every 6 (six) months. Administer in upper arm, thigh, or abdomen    [provider]  escitalopram  (LEXAPRO ) 10 MG tablet Take 10 mg by mouth in the morning. 01/03/17   [provider]  ferrous sulfate  325 (65 FE) MG tablet Take 325 mg by mouth every Sunday.    [provider]  furosemide (LASIX) 20 MG tablet Take 1 tablet by mouth daily as needed. 06/09/23   [provider]  GEMTESA 75 MG TABS Take 1 tablet by mouth daily. 04/21/23   [provider]  lisinopril (ZESTRIL) 10 MG tablet Take by mouth. 08/13/21   [provider]  Magnesium  250 MG TABS Take 250 mg by mouth every evening.    [provider]  Omega-3 Fatty Acids (FISH OIL) 1000 MG CAPS Take 1,000 mg by mouth in the morning and at bedtime.    [provider]  omeprazole (PRILOSEC) 20 MG capsule Take 20 mg by mouth daily. 04/06/23   [provider]  Powders (ANTI MONKEY BUTT) POWD Apply 1 Application topically daily as needed (irritation (skin folds--breast area)).    [provider]  REPATHA SURECLICK 140 MG/ML SOAJ     [provider]  traMADol (ULTRAM) 50 MG tablet Take 1 tablet by mouth 3 (three) times daily as needed. 05/24/23   [provider]  TRELEGY ELLIPTA 100-62.5-25 MCG/ACT AEPB Take 1 puff by mouth daily. 04/05/22   [provider]    Family History Family History  Problem Relation Age of Onset   Pancreatic cancer Mother 33       d. 57   Atrial fibrillation Father    COPD Father    Hypertension Father    High Cholesterol Father    Pancreatic cancer Maternal Aunt 85       d. 72   Pancreatic cancer Maternal Uncle        dx and died in his 45s   Lung cancer Maternal Grandfather 39       d. 83   Heart attack Maternal Grandfather 47       d. 57   Lung cancer Maternal Aunt    Leukemia Maternal Uncle        dx in his 30s-40s   Heart attack Maternal Uncle    Melanoma Other        MGMs sister   Colon cancer Other        MGMs mother    Social History Social History   Tobacco Use   Smoking status: Every Day    Current packs/day: 1.00    Average packs/day: 1 pack/day for 40.0 years (40.0 ttl pk-yrs)    Types: Cigarettes   Smokeless tobacco: Never   Tobacco comments:    Pt attempts to vape at times to quit smoking but has been unsuccessful  Vaping Use   Vaping status: Some Days  Substance Use Topics   Alcohol  use: Never   Drug use: Never     Allergies   Patient has no known  allergies.   Review of Systems Review of Systems Per HPI  Physical Exam Triage Vital Signs ED Triage Vitals [07/23/23 1231]  Encounter Vitals Group     BP 113/75     Girls Systolic BP Percentile      Girls Diastolic BP Percentile  Boys Systolic BP Percentile      Boys Diastolic BP Percentile      Pulse Rate 87     Resp 20     Temp 98.5 F (36.9 C)     Temp Source Oral     SpO2 92 %     Weight      Height      Head Circumference      Peak Flow      Pain Score      Pain Loc      Pain Education      Exclude from Growth Chart    No data found.  Updated Vital Signs BP 113/75 (BP Location: Right Arm)   Pulse 87   Temp 98.5 F (36.9 C) (Oral)   Resp 20   SpO2 92%   Visual Acuity Right Eye Distance:   Left Eye Distance:   Bilateral Distance:    Right Eye Near:   Left Eye Near:    Bilateral Near:     Physical Exam Vitals and nursing note reviewed.  Constitutional:      General: She is not in acute distress.    Appearance: Normal appearance.  HENT:     Head: Normocephalic.  Eyes:     Extraocular Movements: Extraocular movements intact.     Pupils: Pupils are equal, round, and reactive to light.  Pulmonary:     Effort: Pulmonary effort is normal.  Musculoskeletal:     Cervical back: Normal range of motion.  Skin:    General: Skin is warm and dry.      Neurological:     General: No focal deficit present.     Mental Status: She is alert and oriented to person, place, and time.  Psychiatric:        Mood and Affect: Mood normal.        Behavior: Behavior normal.      UC Treatments / Results  Labs (all labs ordered are listed, but only abnormal results are displayed) Labs Reviewed - No data to display  EKG   Radiology No results found.  Procedures Procedures (including critical care time)  Medications Ordered in UC Medications - No data to display  Initial Impression / Assessment and Plan / UC Course  I have reviewed the triage vital  signs and the nursing notes.  Pertinent labs & imaging results that were available during my care of the patient were reviewed by me and considered in my medical decision making (see chart for details).  Patient with 2 areas of papular urticaria to the right inner thigh.  She reports prior tick bite over the past week.  She denies any other systemic symptoms consistent with Lyme disease, states she is unsure of how long the ticks were actually attached.  Will provide prophylactic treatment for Lyme with doxycycline  200 mg one-time dose, along with triamcinolone  cream 0.1% to apply topically to help with itching of the rash.  Supportive care recommendations were provided discussed with the patient to include over-the-counter antihistamines, cool compresses to the areas, and to avoid scratching or manipulating the areas while symptoms persist.  Reiterated to patient to monitor for signs of Lyme disease.  Patient was in agreement with this plan of care and verbalizes understanding.  All questions were answered.  Patient stable for discharge.  Final Clinical Impressions(s) / UC Diagnoses   Final diagnoses:  None   Discharge Instructions   None    ED Prescriptions   None  PDMP not reviewed this encounter.   Gilmer Etta PARAS, NP 07/23/23 1400

## 2023-07-25 NOTE — Telephone Encounter (Signed)
 Lmom for pt to return call

## 2023-07-26 NOTE — Telephone Encounter (Signed)
 Pt was made aware and does want us  to handle this.  Mandy/Ladonna: please nic MRI abd with and without contrast in 2 yrs for surveillance of pancreatic cystic lesion

## 2023-07-27 ENCOUNTER — Other Ambulatory Visit (HOSPITAL_COMMUNITY)

## 2023-07-27 ENCOUNTER — Encounter: Attending: Internal Medicine | Admitting: Emergency Medicine

## 2023-07-27 VITALS — BP 114/69 | HR 96 | Temp 98.1°F | Resp 18

## 2023-07-27 DIAGNOSIS — E785 Hyperlipidemia, unspecified: Secondary | ICD-10-CM | POA: Insufficient documentation

## 2023-07-27 MED ORDER — INCLISIRAN SODIUM 284 MG/1.5ML ~~LOC~~ SOSY
284.0000 mg | PREFILLED_SYRINGE | Freq: Once | SUBCUTANEOUS | Status: AC
  Administered 2023-07-27: 284 mg via SUBCUTANEOUS

## 2023-07-27 NOTE — Progress Notes (Signed)
 Diagnosis: Hyperlipidemia  Provider:   Hyacinth Honey, NP    Procedure: Injection  Leqvio  (inclisiran), Dose: 284 mg, Site: subcutaneous, Number of injections: 1  Injection Site(s): Right arm  Post Care: Patient declined observation  Discharge: Condition: Good, Destination: Home . AVS Declined  Performed by:  Delon ONEIDA Officer, RN

## 2023-07-29 DIAGNOSIS — Z79899 Other long term (current) drug therapy: Secondary | ICD-10-CM | POA: Diagnosis not present

## 2023-07-29 DIAGNOSIS — I517 Cardiomegaly: Secondary | ICD-10-CM | POA: Diagnosis not present

## 2023-07-29 DIAGNOSIS — R7303 Prediabetes: Secondary | ICD-10-CM | POA: Diagnosis not present

## 2023-07-29 DIAGNOSIS — I700 Atherosclerosis of aorta: Secondary | ICD-10-CM | POA: Diagnosis not present

## 2023-08-08 DIAGNOSIS — M47816 Spondylosis without myelopathy or radiculopathy, lumbar region: Secondary | ICD-10-CM | POA: Diagnosis not present

## 2023-08-09 ENCOUNTER — Other Ambulatory Visit: Payer: Self-pay | Admitting: *Deleted

## 2023-08-09 ENCOUNTER — Encounter: Admitting: Vascular Surgery

## 2023-08-09 MED ORDER — PEG 3350-KCL-NA BICARB-NACL 420 G PO SOLR: 4000.0000 mL | Freq: Once | ORAL | 0 refills | Status: AC

## 2023-08-11 ENCOUNTER — Other Ambulatory Visit (HOSPITAL_COMMUNITY)
Admission: RE | Admit: 2023-08-11 | Discharge: 2023-08-11 | Disposition: A | Discharge status: Home / Self Care | Source: Ambulatory Visit | Attending: Internal Medicine | Admitting: Internal Medicine

## 2023-08-11 DIAGNOSIS — Z09 Encounter for follow-up examination after completed treatment for conditions other than malignant neoplasm: Secondary | ICD-10-CM | POA: Diagnosis not present

## 2023-08-11 DIAGNOSIS — K625 Hemorrhage of anus and rectum: Secondary | ICD-10-CM | POA: Diagnosis not present

## 2023-08-11 DIAGNOSIS — Z800 Family history of malignant neoplasm of digestive organs: Secondary | ICD-10-CM | POA: Diagnosis not present

## 2023-08-11 DIAGNOSIS — R1319 Other dysphagia: Secondary | ICD-10-CM | POA: Diagnosis not present

## 2023-08-11 DIAGNOSIS — R1030 Lower abdominal pain, unspecified: Secondary | ICD-10-CM | POA: Diagnosis not present

## 2023-08-11 LAB — BASIC METABOLIC PANEL WITH GFR
Anion gap: 9 (ref 5–15)
BUN: 11 mg/dL (ref 8–23)
CO2: 28 mmol/L (ref 22–32)
Calcium: 9 mg/dL (ref 8.9–10.3)
Chloride: 103 mmol/L (ref 98–111)
Creatinine, Ser: 0.81 mg/dL (ref 0.44–1.00)
GFR, Estimated: >60 mL/min (ref >60)
Glucose, Bld: 125 mg/dL — ABNORMAL HIGH (ref 70–99)
Potassium: 4.5 mmol/L (ref 3.5–5.1)
Sodium: 140 mmol/L (ref 135–145)

## 2023-08-16 ENCOUNTER — Ambulatory Visit (HOSPITAL_COMMUNITY): Payer: Self-pay | Admitting: Anesthesiology

## 2023-08-16 ENCOUNTER — Encounter (HOSPITAL_COMMUNITY)
Admission: RE | Disposition: A | Discharge status: Home / Self Care | Payer: Self-pay | Source: Home / Self Care | Attending: Internal Medicine

## 2023-08-16 ENCOUNTER — Other Ambulatory Visit: Payer: Self-pay

## 2023-08-16 ENCOUNTER — Ambulatory Visit (HOSPITAL_COMMUNITY)
Admission: RE | Admit: 2023-08-16 | Discharge: 2023-08-16 | Disposition: A | Discharge status: Home / Self Care | Attending: Internal Medicine | Admitting: Internal Medicine

## 2023-08-16 ENCOUNTER — Encounter (HOSPITAL_COMMUNITY): Payer: Self-pay | Admitting: Internal Medicine

## 2023-08-16 DIAGNOSIS — F17290 Nicotine dependence, other tobacco product, uncomplicated: Secondary | ICD-10-CM | POA: Diagnosis not present

## 2023-08-16 DIAGNOSIS — D128 Benign neoplasm of rectum: Secondary | ICD-10-CM | POA: Diagnosis not present

## 2023-08-16 DIAGNOSIS — I10 Essential (primary) hypertension: Secondary | ICD-10-CM | POA: Insufficient documentation

## 2023-08-16 DIAGNOSIS — K625 Hemorrhage of anus and rectum: Secondary | ICD-10-CM

## 2023-08-16 DIAGNOSIS — Z5941 Food insecurity: Secondary | ICD-10-CM | POA: Insufficient documentation

## 2023-08-16 DIAGNOSIS — R1310 Dysphagia, unspecified: Secondary | ICD-10-CM | POA: Insufficient documentation

## 2023-08-16 DIAGNOSIS — K5730 Diverticulosis of large intestine without perforation or abscess without bleeding: Secondary | ICD-10-CM | POA: Diagnosis not present

## 2023-08-16 DIAGNOSIS — K2270 Barrett's esophagus without dysplasia: Secondary | ICD-10-CM | POA: Diagnosis not present

## 2023-08-16 DIAGNOSIS — F17210 Nicotine dependence, cigarettes, uncomplicated: Secondary | ICD-10-CM | POA: Insufficient documentation

## 2023-08-16 DIAGNOSIS — F419 Anxiety disorder, unspecified: Secondary | ICD-10-CM | POA: Insufficient documentation

## 2023-08-16 DIAGNOSIS — Z5986 Financial insecurity: Secondary | ICD-10-CM | POA: Insufficient documentation

## 2023-08-16 DIAGNOSIS — K644 Residual hemorrhoidal skin tags: Secondary | ICD-10-CM

## 2023-08-16 DIAGNOSIS — K449 Diaphragmatic hernia without obstruction or gangrene: Secondary | ICD-10-CM

## 2023-08-16 DIAGNOSIS — K621 Rectal polyp: Secondary | ICD-10-CM | POA: Diagnosis not present

## 2023-08-16 DIAGNOSIS — J4489 Other specified chronic obstructive pulmonary disease: Secondary | ICD-10-CM | POA: Diagnosis not present

## 2023-08-16 DIAGNOSIS — F32A Depression, unspecified: Secondary | ICD-10-CM | POA: Diagnosis not present

## 2023-08-16 DIAGNOSIS — K2970 Gastritis, unspecified, without bleeding: Secondary | ICD-10-CM | POA: Insufficient documentation

## 2023-08-16 DIAGNOSIS — D125 Benign neoplasm of sigmoid colon: Secondary | ICD-10-CM | POA: Diagnosis not present

## 2023-08-16 DIAGNOSIS — K2289 Other specified disease of esophagus: Secondary | ICD-10-CM

## 2023-08-16 DIAGNOSIS — K635 Polyp of colon: Secondary | ICD-10-CM

## 2023-08-16 DIAGNOSIS — Z800 Family history of malignant neoplasm of digestive organs: Secondary | ICD-10-CM | POA: Diagnosis not present

## 2023-08-16 DIAGNOSIS — K648 Other hemorrhoids: Secondary | ICD-10-CM | POA: Insufficient documentation

## 2023-08-16 DIAGNOSIS — R1013 Epigastric pain: Secondary | ICD-10-CM | POA: Diagnosis not present

## 2023-08-16 HISTORY — PX: COLONOSCOPY: SHX5424

## 2023-08-16 HISTORY — DX: Chronic obstructive pulmonary disease, unspecified: J44.9

## 2023-08-16 HISTORY — PX: ESOPHAGOGASTRODUODENOSCOPY: SHX5428

## 2023-08-16 HISTORY — DX: Unspecified asthma, uncomplicated: J45.909

## 2023-08-16 HISTORY — PX: ESOPHAGEAL DILATION: SHX303

## 2023-08-16 SURGERY — COLONOSCOPY
Anesthesia: General

## 2023-08-16 MED ORDER — LACTATED RINGERS IV SOLN: INTRAVENOUS | Status: DC

## 2023-08-16 MED ORDER — LIDOCAINE 2% (20 MG/ML) 5 ML SYRINGE
INTRAMUSCULAR | Status: AC
  Filled 2023-08-16: qty 5

## 2023-08-16 MED ORDER — PROPOFOL 500 MG/50ML IV EMUL
INTRAVENOUS | Status: DC | PRN
  Administered 2023-08-16: 80 ug/kg/min via INTRAVENOUS

## 2023-08-16 MED ORDER — LIDOCAINE 2% (20 MG/ML) 5 ML SYRINGE
INTRAMUSCULAR | Status: DC | PRN
  Administered 2023-08-16: 50 mg via INTRAVENOUS

## 2023-08-16 MED ORDER — PROPOFOL 500 MG/50ML IV EMUL
INTRAVENOUS | Status: AC
Start: 2023-08-16 — End: 2023-08-16
  Filled 2023-08-16: qty 50

## 2023-08-16 MED ORDER — LACTATED RINGERS IV SOLN
INTRAVENOUS | Status: DC | PRN
Start: 2023-08-16 — End: 2023-08-16

## 2023-08-16 NOTE — H&P (Signed)
 Primary Care Physician:  Suanne Pfeiffer, NP Primary Gastroenterologist:  Dr. Cindie  Pre-Procedure History & Physical: HPI:  Melissa Weaver is a 73 y.o. female is here for an EGD with possible dilation due to history of dysphagia and colonoscopy for rectal bleeding  Past Medical History:  Diagnosis Date   Anxiety    Arthritis    Asthma    Complication of anesthesia    COPD (chronic obstructive pulmonary disease) (HCC)    Depression    Family history of adverse reaction to anesthesia    son had postoperative N&V   Family history of colon cancer    Family history of melanoma    Family history of pancreatic cancer    GERD (gastroesophageal reflux disease)    HLD (hyperlipidemia)    HTN (hypertension)    Melanoma (HCC)    Personal history of radiation therapy    2019 left breast   PONV (postoperative nausea and vomiting)    Seizures (HCC)    had a seizure as child, unknown etiology, no meds though. No seizures since 10 years.    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 OR;  Service: ENT;  Laterality: Right;   PARTIAL MASTECTOMY WITH NEEDLE LOCALIZATION Left 05/11/2017   Procedure: PARTIAL MASTECTOMY WITH NEEDLE LOCALIZATION;  Surgeon: Kallie Manuelita BROCKS, MD;  Location: [facility] ORS;  Service: General;  Laterality: Left;   TUBAL LIGATION      Prior to Admission medications   Medication Sig Start Date End Date Taking? Authorizing Provider  acetaminophen  (TYLENOL ) 650 MG CR tablet Take 1,300 mg by mouth daily as needed for pain.   Yes [provider]  albuterol (VENTOLIN HFA) 108 (90 Base) MCG/ACT inhaler Inhale 2 puffs into the lungs every 4 (four) hours as needed. 03/11/22  Yes [provider]  ALPRAZolam  (XANAX ) 0.25 MG tablet Take  0.25 mg by mouth See admin instructions. Take 1 tablet (0.25 mg) by mouth scheduled every morning & may take an additional 2 doses as needed for anxiety.   Yes [provider]  atorvastatin  (LIPITOR) 10 MG tablet Take 10 mg by mouth every evening. 09/05/18  Yes [provider]  Cholecalciferol  125 MCG (5000 UT) capsule Take 5,000 Units by mouth daily.   Yes [provider]  escitalopram  (LEXAPRO ) 10 MG tablet Take 10 mg by mouth in the morning. 01/03/17  Yes [provider]  furosemide (LASIX) 20 MG tablet Take 1 tablet by mouth daily as needed. 06/09/23  Yes [provider]  GEMTESA 75 MG TABS Take 1 tablet by mouth daily. 04/21/23  Yes [provider]  Magnesium  250 MG TABS Take 250 mg by mouth every evening.   Yes [provider]  omeprazole (PRILOSEC) 20 MG capsule Take 20 mg by mouth daily. 04/06/23  Yes [provider]  traMADol (ULTRAM) 50 MG tablet Take 1 tablet by mouth 3 (three) times daily as needed. 05/24/23  Yes [provider]  Calcium  Carb-Cholecalciferol  (CALCIUM  600+D3 PO) Take 1 tablet by mouth in the morning and at bedtime.    [provider]  denosumab  (PROLIA ) 60 MG/ML SOLN injection Inject 60 mg into the skin every 6 (six) months. Administer in upper arm, thigh, or abdomen    [provider]  ferrous sulfate  325 (65  FE) MG tablet Take 325 mg by mouth every Sunday.    [provider]  lisinopril (ZESTRIL) 10 MG tablet Take by mouth. 08/13/21   [provider]  Omega-3 Fatty Acids (FISH OIL) 1000 MG CAPS Take 1,000 mg by mouth in the morning and at bedtime.    [provider]  Powders (ANTI MONKEY BUTT) POWD Apply 1 Application topically daily as needed (irritation (skin folds--breast area)).    [provider]  REPATHA SURECLICK 140 MG/ML SOAJ     [provider]  TRELEGY ELLIPTA 100-62.5-25 MCG/ACT AEPB Take 1 puff by mouth daily. 04/05/22    [provider]  triamcinolone  cream (KENALOG ) 0.1 % Apply 1 Application topically 2 (two) times daily. 07/23/23   Leath-Warren, Etta PARAS, NP    Allergies as of 07/14/2023   (No Known Allergies)    Family History  Problem Relation Age of Onset   Pancreatic cancer Mother 16       d. 48   Atrial fibrillation Father    COPD Father    Hypertension Father    High Cholesterol Father    Pancreatic cancer Maternal Aunt 13       d. 88   Pancreatic cancer Maternal Uncle        dx and died in his 58s   Lung cancer Maternal Grandfather 14       d. 26   Heart attack Maternal Grandfather 76       d. 92   Lung cancer Maternal Aunt    Leukemia Maternal Uncle        dx in his 30s-40s   Heart attack Maternal Uncle    Melanoma Other        MGMs sister   Colon cancer Other        MGMs mother    Social History   Socioeconomic History   Marital status: Widowed    Spouse name: Not on file   Number of children: 1   Years of education: Not on file   Highest education level: Not on file  Occupational History   Not on file  Tobacco Use   Smoking status: Every Day    Current packs/day: 1.00    Average packs/day: 1 pack/day for 40.0 years (40.0 ttl pk-yrs)    Types: Cigarettes   Smokeless tobacco: Never   Tobacco comments:    Pt attempts to vape at times to quit smoking but has been unsuccessful  Vaping Use   Vaping status: Some Days  Substance and Sexual Activity   Alcohol  use: Never   Drug use: Never   Sexual activity: Yes    Birth control/protection: None  Other Topics Concern   Not on file  Social History Narrative   Not on file   Social Drivers of Health   Financial Resource Strain: High Risk (07/28/2023)   Received from [facility]   Overall Financial Resource Strain (CARDIA)    Difficulty of Paying Living Expenses: Very hard  Food Insecurity: Food Insecurity Present (07/28/2023)   Received from [facility]   Hunger Vital Sign    Within the past 12 months,  you worried that your food would run out before you got the money to buy more.: Sometimes true    Within the past 12 months, the food you bought just didn't last and you didn't have money to get more.: Sometimes true  Transportation Needs: No Transportation Needs (07/28/2023)   Received from [facility] -  Administrator, Civil Service (Medical): No    Lack of Transportation (Non-Medical): No  Physical Activity: Unknown (07/28/2023)   Received from [facility]   Exercise Vital Sign    On average, how many days per week do you engage in moderate to strenuous exercise (like a brisk walk)?: 0 days    Minutes of Exercise per Session: Not on file  Stress: Stress Concern Present (07/28/2023)   Received from [facility] - Occupational Stress Questionnaire    Feeling of Stress : To some extent  Social Connections: Patient Declined (07/28/2023)   Received from [facility]   Social Network    How would you rate your social network (family, work, friends)?: Patient declined  Intimate Partner Violence: Not At Risk (07/28/2023)   Received from [facility]   HITS    Over the last 12 months how often did your partner physically hurt you?: Never    Over the last 12 months how often did your partner insult you or talk down to you?: Never    Over the last 12 months how often did your partner threaten you with physical harm?: Never    Over the last 12 months how often did your partner scream or curse at you?: Never    Review of Systems: General: Negative for fever, chills, fatigue, weakness. Eyes: Negative for vision changes.  ENT: Negative for hoarseness, difficulty swallowing , nasal congestion. CV: Negative for chest pain, angina, palpitations, dyspnea on exertion, peripheral edema.  Respiratory: Negative for dyspnea at rest, dyspnea on exertion, cough, sputum, wheezing.  GI: See history of present illness. GU:  Negative for  dysuria, hematuria, urinary incontinence, urinary frequency, nocturnal urination.  MS: Negative for joint pain, low back pain.  Derm: Negative for rash or itching.  Neuro: Negative for weakness, abnormal sensation, seizure, frequent headaches, memory loss, confusion.  Psych: Negative for anxiety, depression Endo: Negative for unusual weight change.  Heme: Negative for bruising or bleeding. Allergy: Negative for rash or hives.  Physical Exam: Vital signs in last 24 hours: Temp:  [97.8 F (36.6 C)] 97.8 F (36.6 C) (07/29 0957) Pulse Rate:  [107] 107 (07/29 0957) Resp:  [12] 12 (07/29 0957) BP: (132)/(64) 132/64 (07/29 0957) SpO2:  [96 %] 96 % (07/29 0957) Weight:  [91.6 kg] 91.6 kg (07/29 0957)   General:   Alert,  Well-developed, well-nourished, pleasant and cooperative in NAD Head:  Normocephalic and atraumatic. Eyes:  Sclera clear, no icterus.   Conjunctiva pink. Ears:  Normal auditory acuity. Nose:  No deformity, discharge,  or lesions. Msk:  Symmetrical without gross deformities. Normal posture. Extremities:  Without clubbing or edema. Neurologic:  Alert and  oriented x4;  grossly normal neurologically. Skin:  Intact without significant lesions or rashes. Psych:  Alert and cooperative. Normal mood and affect.   Impression/Plan: Melissa Weaver is here for an EGD with possible dilation due to history of dysphagia and colonoscopy for rectal bleeding  Risks, benefits, limitations, imponderables and alternatives regarding procedure have been reviewed with the patient. Questions have been answered. All parties agreeable.

## 2023-08-16 NOTE — Anesthesia Preprocedure Evaluation (Signed)
 Anesthesia Evaluation  Patient identified by MRN, date of birth, ID band Patient awake    Reviewed: Allergy & Precautions, H&P , NPO status , Patient's Chart, lab work & pertinent test results, reviewed documented beta blocker date and time   History of Anesthesia Complications (+) PONV, Family history of anesthesia reaction and history of anesthetic complications  Airway Mallampati: II  TM Distance: >3 FB Neck ROM: full    Dental no notable dental hx.    Pulmonary asthma , COPD, Current Smoker   Pulmonary exam normal breath sounds clear to auscultation       Cardiovascular Exercise Tolerance: Good hypertension,  Rhythm:regular Rate:Normal     Neuro/Psych Seizures -,  PSYCHIATRIC DISORDERS Anxiety Depression       GI/Hepatic Neg liver ROS,GERD  ,,  Endo/Other  negative endocrine ROS    Renal/GU negative Renal ROS  negative genitourinary   Musculoskeletal   Abdominal   Peds  Hematology negative hematology ROS (+)   Anesthesia Other Findings   Reproductive/Obstetrics negative OB ROS                              Anesthesia Physical Anesthesia Plan  ASA: 2  Anesthesia Plan: General   Post-op Pain Management:    Induction:   PONV Risk Score and Plan: Propofol  infusion  Airway Management Planned:   Additional Equipment:   Intra-op Plan:   Post-operative Plan:   Informed Consent: I have reviewed the patients History and Physical, chart, labs and discussed the procedure including the risks, benefits and alternatives for the proposed anesthesia with the patient or authorized representative who has indicated his/her understanding and acceptance.     Dental Advisory Given  Plan Discussed with: CRNA  Anesthesia Plan Comments:         Anesthesia Quick Evaluation

## 2023-08-16 NOTE — Op Note (Signed)
 [facility] Patient Name: Melissa Weaver Procedure Date: 08/16/2023 10:28 AM MRN: [id number] Date of Birth: Aug 20, 1950 Attending MD: [name] , [place], [id number] CSN: [id number] Age: 73 Admit Type: Outpatient Procedure:                Colonoscopy Indications:              Rectal bleeding Providers:                [name], DO, Jon LABOR. Gerome RN, RN,                            [place] Wilson, Technician Referring MD:              Medicines:                See the Anesthesia note for documentation of the                            administered medications Complications:            No immediate complications. Estimated Blood Loss:     Estimated blood loss was minimal. Procedure:                Pre-Anesthesia Assessment:                           - The anesthesia plan was to use monitored                            anesthesia care (MAC).                           After obtaining informed consent, the colonoscope                            was passed under direct vision. Throughout the                            procedure, the patient's blood pressure, pulse, and                            oxygen saturations were monitored continuously. The                            [id number]-HQ190L ([id number]) scope was introduced through                            the anus and advanced to the the cecum, identified                            by appendiceal orifice and ileocecal valve. The                            colonoscopy was performed without difficulty. The                            patient tolerated the procedure well. The  quality                            of the bowel preparation was evaluated using the                            BBPS [facility] Bowel Preparation Scale) with scores                            of: Right Colon = 3, Transverse Colon = 3 and Left                            Colon = 3 (entire mucosa seen well with no residual                            staining, small  fragments of stool or opaque                            liquid). The total BBPS score equals 9. Scope In: 10:52:38 AM Scope Out: 11:09:15 AM Scope Withdrawal Time: 0 hours 10 minutes 10 seconds  Total Procedure Duration: 0 hours 16 minutes 37 seconds  Findings:      Hemorrhoids were found on perianal exam.      Non-bleeding internal hemorrhoids were found during retroflexion.      Multiple medium-mouthed and small-mouthed diverticula were found in the       sigmoid colon and descending colon.      Two semi-pedunculated polyps were found in the rectum and sigmoid colon.       The polyps were 4 to 8 mm in size. These polyps were removed with a cold       snare. Resection and retrieval were complete.      The exam was otherwise without abnormality. Impression:               - Hemorrhoids found on perianal exam.                           - Non-bleeding internal hemorrhoids.                           - Diverticulosis in the sigmoid colon and in the                            descending colon.                           - Two 4 to 8 mm polyps in the rectum and in the                            sigmoid colon, removed with a cold snare. Resected                            and retrieved.                           - The examination was  otherwise normal. Moderate Sedation:      Per Anesthesia Care Recommendation:           - Patient has a contact number available for                            emergencies. The signs and symptoms of potential                            delayed complications were discussed with the                            patient. Return to normal activities tomorrow.                            Written discharge instructions were provided to the                            patient.                           - Resume previous diet.                           - Continue present medications.                           - Await pathology results.                           - Repeat  colonoscopy in 7 years for surveillance.                           - Return to GI clinic in 8 weeks. Procedure Code(s):        --- Professional ---                           [phone], Colonoscopy, flexible; with removal of                            tumor(s), polyp(s), or other lesion(s) by snare                            technique Diagnosis Code(s):        --- Professional ---                           K64.8, Other hemorrhoids                           D12.8, Benign neoplasm of rectum                           D12.5, Benign neoplasm of sigmoid colon                           K62.5, Hemorrhage of anus and rectum  K57.30, Diverticulosis of large intestine without                            perforation or abscess without bleeding CPT copyright 2022 American Medical Association. All rights reserved. The codes documented in this report are preliminary and upon coder review may  be revised to meet current compliance requirements. [name], DO [name], DO 08/16/2023 11:12:05 AM This report has been signed electronically. Number of Addenda: 0

## 2023-08-16 NOTE — Op Note (Signed)
 [facility] Patient Name: Melissa Weaver Procedure Date: 08/16/2023 10:31 AM MRN: [id number] Date of Birth: 1950-04-28 Attending MD: [name] , [place], [id number] CSN: [id number] Age: 73 Admit Type: Outpatient Procedure:                Upper GI endoscopy Indications:              Dysphagia Providers:                [name], DO, Jon LABOR. Gerome RN, RN,                            [place] Wilson, Technician Referring MD:              Medicines:                See the Anesthesia note for documentation of the                            administered medications Complications:            No immediate complications. Estimated Blood Loss:     Estimated blood loss was minimal. Procedure:                Pre-Anesthesia Assessment:                           - The anesthesia plan was to use monitored                            anesthesia care (MAC).                           After obtaining informed consent, the endoscope was                            passed under direct vision. Throughout the                            procedure, the patient's blood pressure, pulse, and                            oxygen saturations were monitored continuously. The                            [id number]-H190 ([id number]) scope was introduced through the                            mouth, and advanced to the second part of duodenum.                            The upper GI endoscopy was accomplished without                            difficulty. The patient tolerated the procedure                            well. Scope In: 10:41:50  AM Scope Out: 10:48:59 AM Total Procedure Duration: 0 hours 7 minutes 9 seconds  Findings:      A small hiatal hernia was present.      The Z-line was variable. Biopsies were taken with a cold forceps for       histology.      No endoscopic abnormality was evident in the esophagus to explain the       patient's complaint of dysphagia. Preparations were made for empiric        dilation. A TTS dilator was passed through the scope. Dilation with an       18-19-20 mm balloon dilator was performed to 20 mm. Dilation was       performed with a mild resistance at 20 mm. Estimated blood loss was none.      Patchy mild inflammation characterized by erythema was found in the       gastric body. Biopsies were taken with a cold forceps for Helicobacter       pylori testing.      The duodenal bulb, first portion of the duodenum and second portion of       the duodenum were normal. Impression:               - Small hiatal hernia.                           - Z-line variable. Biopsied.                           - Gastritis. Biopsied.                           - Normal duodenal bulb, first portion of the                            duodenum and second portion of the duodenum. Moderate Sedation:      Per Anesthesia Care Recommendation:           - Patient has a contact number available for                            emergencies. The signs and symptoms of potential                            delayed complications were discussed with the                            patient. Return to normal activities tomorrow.                            Written discharge instructions were provided to the                            patient.                           - Resume previous diet.                           - Continue present  medications.                           - Await pathology results.                           - Repeat upper endoscopy PRN for retreatment.                           - Return to GI clinic in 8 weeks.                           - Use a proton pump inhibitor PO daily. Procedure Code(s):        --- Professional ---                           [phone], Esophagogastroduodenoscopy, flexible,                            transoral; with biopsy, single or multiple Diagnosis Code(s):        --- Professional ---                           K44.9, Diaphragmatic hernia without obstruction  or                            gangrene                           K22.89, Other specified disease of esophagus                           K29.70, Gastritis, unspecified, without bleeding                           R13.10, Dysphagia, unspecified CPT copyright 2022 American Medical Association. All rights reserved. The codes documented in this report are preliminary and upon coder review may  be revised to meet current compliance requirements. [name], DO Carlin POUR. Surafel Hilleary, DO 08/16/2023 10:50:19 AM This report has been signed electronically. Number of Addenda: 0

## 2023-08-16 NOTE — Discharge Instructions (Addendum)
 EGD Discharge instructions Please read the instructions outlined below and refer to this sheet in the next few weeks. These discharge instructions provide you with general information on caring for yourself after you leave the hospital. Your doctor may also give you specific instructions. While your treatment has been planned according to the most current medical practices available, unavoidable complications occasionally occur. If you have any problems or questions after discharge, please call your doctor. ACTIVITY You may resume your regular activity but move at a slower pace for the next 24 hours.  Take frequent rest periods for the next 24 hours.  Walking will help expel (get rid of) the air and reduce the bloated feeling in your abdomen.  No driving for 24 hours (because of the anesthesia (medicine) used during the test).  You may shower.  Do not sign any important legal documents or operate any machinery for 24 hours (because of the anesthesia used during the test).  NUTRITION Drink plenty of fluids.  You may resume your normal diet.  Begin with a light meal and progress to your normal diet.  Avoid alcoholic beverages for 24 hours or as instructed by your caregiver.  MEDICATIONS You may resume your normal medications unless your caregiver tells you otherwise.  WHAT YOU CAN EXPECT TODAY You may experience abdominal discomfort such as a feeling of fullness or "gas" pains.  FOLLOW-UP Your doctor will discuss the results of your test with you.  SEEK IMMEDIATE MEDICAL ATTENTION IF ANY OF THE FOLLOWING OCCUR: Excessive nausea (feeling sick to your stomach) and/or vomiting.  Severe abdominal pain and distention (swelling).  Trouble swallowing.  Temperature over 101 F (37.8 C).  Rectal bleeding or vomiting of blood.      Colonoscopy Discharge Instructions  Read the instructions outlined below and refer to this sheet in the next few weeks. These discharge instructions provide you  with general information on caring for yourself after you leave the hospital. Your doctor may also give you specific instructions. While your treatment has been planned according to the most current medical practices available, unavoidable complications occasionally occur.   ACTIVITY You may resume your regular activity, but move at a slower pace for the next 24 hours.  Take frequent rest periods for the next 24 hours.  Walking will help get rid of the air and reduce the bloated feeling in your belly (abdomen).  No driving for 24 hours (because of the medicine (anesthesia) used during the test).   Do not sign any important legal documents or operate any machinery for 24 hours (because of the anesthesia used during the test).  NUTRITION Drink plenty of fluids.  You may resume your normal diet as instructed by your doctor.  Begin with a light meal and progress to your normal diet. Heavy or fried foods are harder to digest and may make you feel sick to your stomach (nauseated).  Avoid alcoholic beverages for 24 hours or as instructed.  MEDICATIONS You may resume your normal medications unless your doctor tells you otherwise.  WHAT YOU CAN EXPECT TODAY Some feelings of bloating in the abdomen.  Passage of more gas than usual.  Spotting of blood in your stool or on the toilet paper.  IF YOU HAD POLYPS REMOVED DURING THE COLONOSCOPY: No aspirin products for 7 days or as instructed.  No alcohol  for 7 days or as instructed.  Eat a soft diet for the next 24 hours.  FINDING OUT THE RESULTS OF YOUR TEST Not all test results  are available during your visit. If your test results are not back during the visit, make an appointment with your caregiver to find out the results. Do not assume everything is normal if you have not heard from your caregiver or the medical facility. It is important for you to follow up on all of your test results.  SEEK IMMEDIATE MEDICAL ATTENTION IF: You have more than a  spotting of blood in your stool.  Your belly is swollen (abdominal distention).  You are nauseated or vomiting.  You have a temperature over 101.  You have abdominal pain or discomfort that is severe or gets worse throughout the day.   Your EGD revealed mild amount inflammation in your stomach.  I took biopsies of this to rule out infection with a bacteria called H. pylori.  I also took samples of your esophagus.  We will call with these results.  I did stretch your esophagus today.  Hopefully this helps with feeling of food getting stuck.  Small bowel was normal.  Continue on omeprazole.    Your colonoscopy revealed 2 polyp(s) which I removed successfully. Await pathology results, my office will contact you. I recommend repeating colonoscopy in 7 years for surveillance purposes.   You also have diverticulosis and internal hemorrhoids. I would recommend increasing fiber in your diet or adding OTC Benefiber/Metamucil. Be sure to drink at least 4 to 6 glasses of water daily.   Follow-up with GI in 8 weeks. Message sent to office   I hope you have a great rest of your week!  [name], D.O. Gastroenterology and Hepatology [facility]

## 2023-08-16 NOTE — Transfer of Care (Signed)
 Immediate Anesthesia Transfer of Care Note  Patient: Melissa Weaver  Procedure(s) Performed: COLONOSCOPY EGD (ESOPHAGOGASTRODUODENOSCOPY) DILATION, ESOPHAGUS  Patient Location: Endoscopy Unit  Anesthesia Type:General  Level of Consciousness: awake, alert , and oriented  Airway & Oxygen Therapy: Patient Spontanous Breathing  Post-op Assessment: Report given to RN, Post -op Vital signs reviewed and stable, Patient moving all extremities X 4, and Patient able to stick tongue midline  Post vital signs: Reviewed and stable  Last Vitals:  Vitals Value Taken Time  BP 115/73 08/16/23 11:12  Temp 36.7 C 08/16/23 11:12  Pulse 102 08/16/23 11:12  Resp 21 08/16/23 11:12  SpO2 95 % 08/16/23 11:12    Last Pain:  Vitals:   08/16/23 1112  TempSrc: Oral  PainSc: 0-No pain         Complications: No notable events documented.

## 2023-08-17 ENCOUNTER — Encounter (HOSPITAL_COMMUNITY): Payer: Self-pay | Admitting: Internal Medicine

## 2023-08-17 LAB — SURGICAL PATHOLOGY

## 2023-08-17 NOTE — Anesthesia Postprocedure Evaluation (Signed)
 Anesthesia Post Note  Patient: Melissa Weaver  Procedure(s) Performed: COLONOSCOPY EGD (ESOPHAGOGASTRODUODENOSCOPY) DILATION, ESOPHAGUS  Patient location during evaluation: Phase II Anesthesia Type: General Level of consciousness: awake Pain management: pain level controlled Vital Signs Assessment: post-procedure vital signs reviewed and stable Respiratory status: spontaneous breathing and respiratory function stable Cardiovascular status: blood pressure returned to baseline and stable Postop Assessment: no headache and no apparent nausea or vomiting Anesthetic complications: no Comments: Late entry   No notable events documented.   Last Vitals:  Vitals:   08/16/23 0957 08/16/23 1112  BP: 132/64 115/73  Pulse: (!) 107 (!) 102  Resp: 12 (!) 21  Temp: 36.6 C 36.7 C  SpO2: 96% 95%    Last Pain:  Vitals:   08/16/23 1112  TempSrc: Oral  PainSc: 0-No pain                 Yvonna JINNY Bosworth

## 2023-08-18 ENCOUNTER — Ambulatory Visit: Payer: Self-pay | Admitting: Internal Medicine

## 2023-08-19 NOTE — Progress Notes (Signed)
 noted

## 2023-08-25 NOTE — Therapy (Signed)
 OUTPATIENT PHYSICAL THERAPY THORACOLUMBAR EVALUATION   Patient Name: Melissa Weaver MRN: [id number] DOB:[birth year], 73 y.o., female Today's Date: 08/26/2023  END OF SESSION:  PT End of Session - 08/26/23 1346     Visit Number 1    Number of Visits 8    Date for PT Re-Evaluation 09/23/23    Authorization Type HTA    Authorization Time Period no auth needed    PT Start Time 1345    PT Stop Time 1425    PT Time Calculation (min) 40 min    Activity Tolerance Patient tolerated treatment well    Behavior During Therapy WFL for tasks assessed/performed          Past Medical History:  Diagnosis Date   Anxiety    Arthritis    Asthma    Complication of anesthesia    COPD (chronic obstructive pulmonary disease) (HCC)    Depression    Family history of adverse reaction to anesthesia    son had postoperative N&V   Family history of colon cancer    Family history of melanoma    Family history of pancreatic cancer    GERD (gastroesophageal reflux disease)    HLD (hyperlipidemia)    HTN (hypertension)    Melanoma (HCC)    Personal history of radiation therapy    2019 left breast   PONV (postoperative nausea and vomiting)    Seizures (HCC)    had a seizure as child, unknown etiology, no meds though. No seizures since 10 years.   Past Surgical History:  Procedure Laterality Date   BREAST LUMPECTOMY Left 05/11/2017   intermediate grade DCIS, margins negative   COLONOSCOPY N/A 08/16/2023   Procedure: COLONOSCOPY;  Surgeon: Cindie Carlin POUR, DO;  Location: [facility];  Service: Endoscopy;  Laterality: N/A;  10:15 am, asa 2   ESOPHAGEAL DILATION N/A 08/16/2023   Procedure: DILATION, ESOPHAGUS;  Surgeon: Cindie Carlin POUR, DO;  Location: [facility];  Service: Endoscopy;  Laterality: N/A;   ESOPHAGOGASTRODUODENOSCOPY N/A 08/16/2023   Procedure: EGD (ESOPHAGOGASTRODUODENOSCOPY);  Surgeon: Cindie Carlin POUR, DO;  Location: [facility];  Service: Endoscopy;  Laterality: N/A;    PAROTIDECTOMY Right 11/13/2021   Procedure: SUPERFICIAL PAROTIDECTOMY WITH FACIAL NERVE DISSECTION;  Surgeon: Jesus Oliphant, MD;  Location: [facility] OR;  Service: ENT;  Laterality: Right;   PARTIAL MASTECTOMY WITH NEEDLE LOCALIZATION Left 05/11/2017   Procedure: PARTIAL MASTECTOMY WITH NEEDLE LOCALIZATION;  Surgeon: Kallie Manuelita BROCKS, MD;  Location: [facility] OR[facility];  Service: General;  Laterality: Left;   TUBAL LIGATION     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   HLD (hyperlipidemia) 04/07/2017   Anxiety state 04/07/2017   Depression 04/07/2017   HTN (hypertension) 04/07/2017    PCP: Suanne Pfeiffer, NP  REFERRING PROVIDER: Mylinda Forest, Hart CHRISTELLA, NP REFERRING DIAG: [phone] (ICD-10-CM) - Spondylosis without myelopathy or radiculopathy, lumbar region M48.061 (ICD-10-CM) - Spinal stenosis, lumbar region without neurogenic claudication M51.360 (ICD-10-CM) - Degeneration of intervertebral disc of lumbar region with discogenic back pain G89.4 (ICD-10-CM) - Chronic pain syndrome  Rationale for Evaluation and Treatment: Rehabilitation  THERAPY DIAG:  Other low back pain  Difficulty in walking, not elsewhere classified  ONSET DATE: chronic but worse since Jan 2025  SUBJECTIVE:                                                                                                                                                                                           SUBJECTIVE STATEMENT: Chronic pain but worse since Jan 2025.  Had a fall the last week in Jan.  Seems to have irritated everything. Saw PCP first and had a scan and then went to [employer] [facility]. PCP sent to back doctor and had MRI and then had an injection.  Injection helped a little.  Dr. Lear office then referred to therapy  PERTINENT HISTORY:  Bit by a tick recently and had to take meds States her balance is not good at baseline  PAIN:  Are you having pain? Yes: NPRS scale: 5/10 Pain location: low back and posterior hips Pain description: aches Aggravating factors: walking  Relieving factors: pain medication  PRECAUTIONS: None  RED FLAGS: None   WEIGHT BEARING RESTRICTIONS: No  FALLS:  Has patient fallen in last 6 months? No   OCCUPATION: retired  PLOF: Independent  PATIENT GOALS: less pain  NEXT MD VISIT: 09/15/23  OBJECTIVE:  Note: Objective measures were completed at Evaluation unless otherwise noted.  DIAGNOSTIC FINDINGS:  CLINICAL DATA:  Low back pain   EXAM: MRI LUMBAR SPINE WITHOUT CONTRAST   TECHNIQUE: Multiplanar, multisequence MR imaging of the lumbar spine was performed. No intravenous contrast was administered.   COMPARISON:  CT 04/15/2023   FINDINGS: Segmentation:  5 lumbar type vertebral bodies.   Alignment: No significant malalignment. 1-2 mm degenerative anterolisthesis L4-5.   Vertebrae: No fracture or focal bone lesion. No edematous endplate marrow changes or edematous facet arthritis.   Conus medullaris and cauda equina: Conus extends to the L1 level. Conus and cauda equina appear normal.   Paraspinal and other soft tissues: Negative   Disc levels:   No significant finding from T11-12 through L2-3: Minimal disc bulges but no compressive narrowing of the canal or foramina.   L3-4: Shallow protrusion of the disc. Facet and ligamentous hypertrophy. Multifactorial stenosis particularly affecting the lateral recesses. Definite neural compression is not established, but the lateral recess stenosis could potentially be symptomatic.   L4-5: Disc degeneration with shallow protrusion, more towards the right, with  a foraminal to extraforaminal disc osteophyte complex. Facet degeneration and hypertrophy with 1-2 mm of anterolisthesis. No compressive central canal stenosis. Narrowing of both subarticular lateral recesses. Foraminal to extraforaminal encroachment on the right that could affect the right L4 nerve. The  facet arthritis could be a cause of back pain or referred facet syndrome pain.   L5-S1: Endplate osteophytes and small left posterolateral to foraminal disc herniation. No central canal stenosis. The L5 nerve appears to exit without gross compression, but left L5 nerve irritation could possibly occur.   IMPRESSION: 1. L3-4: Shallow protrusion of the disc. Facet and ligamentous hypertrophy. Multifactorial stenosis particularly affecting the lateral recesses. Definite neural compression is not established, but the lateral recess stenosis could possibly be symptomatic. 2. L4-5: Shallow protrusion of the disc more towards the right, with a right foraminal to extraforaminal disc osteophyte complex. Facet degeneration and hypertrophy with 1-2 mm of anterolisthesis. Narrowing of both subarticular lateral recesses. Foraminal to extraforaminal encroachment on the right by prominent disc osteophyte complex that could affect the right L4 nerve. The facet arthritis could be a cause of back pain or referred facet syndrome pain. 3. L5-S1: Endplate osteophytes and small left posterolateral to foraminal disc herniation. No central canal stenosis. The L5 nerve appears to exit without gross compression, but left L5 nerve irritation could possibly occur.     Electronically Signed   By: Oneil Officer M.D.   On: 06/25/2023 11:42  PATIENT SURVEYS:  Modified Oswestry:  MODIFIED OSWESTRY DISABILITY SCALE  Date: 08/26/23 Score                                Total 24/50; 48%   Interpretation of scores: Score Category Description  0-20% Minimal Disability The patient can cope with most  living activities. Usually no treatment is indicated apart from advice on lifting, sitting and exercise  21-40% Moderate Disability The patient experiences more pain and difficulty with sitting, lifting and standing. Travel and social life are more difficult and they may be disabled from work. Personal care, sexual activity and sleeping are not grossly affected, and the patient can usually be managed by conservative means  41-60% Severe Disability Pain remains the main problem in this group, but activities of daily living are affected. These patients require a detailed investigation  61-80% Crippled Back pain impinges on all aspects of the patient's life. Positive intervention is required  81-100% Bed-bound  These patients are either bed-bound or exaggerating their symptoms  Bluford FORBES Zoe DELENA Karon DELENA, et al. Surgery versus conservative management of stable thoracolumbar fracture: the PRESTO feasibility RCT. [place] ([place]): [employer]; 2021 Nov. [facility] Technology Assessment, No. 25.62.) Appendix 3, Oswestry Disability Index category descriptors. Available from: FindJewel[employer].cz  Minimally Clinically Important Difference (MCID) = 12.8%  COGNITION: Overall cognitive status: Within functional limits for tasks assessed     SENSATION: WFL  MUSCLE LENGTH: Slightly tighter right > left  POSTURE: rounded shoulders and forward head  PALPATION: Tender low back paraspinals; quadratus lumbarom  LUMBAR ROM: * pain  AROM eval  Flexion Fingertips to mid shin*  Extension 20% available*  Right lateral flexion To 1 above knee joint line  Left lateral flexion To 1 above knee joint line  Right rotation   Left rotation    (Blank rows = not tested)  LOWER EXTREMITY ROM:     Active  Right eval Left eval  Hip flexion    Hip extension    Hip abduction    Hip adduction    Hip internal rotation    Hip external rotation    Knee flexion    Knee  extension    Ankle dorsiflexion    Ankle  plantarflexion    Ankle inversion    Ankle eversion     (Blank rows = not tested)  LOWER EXTREMITY MMT:    MMT Right eval Left eval  Hip flexion 4 4+  Hip extension 4- 4-  Hip abduction    Hip adduction    Hip internal rotation    Hip external rotation    Knee flexion 4 4  Knee extension 4+ 5  Ankle dorsiflexion 5 5  Ankle plantarflexion    Ankle inversion    Ankle eversion     (Blank rows = not tested)   FUNCTIONAL TESTS:  5 times sit to stand: 27.01 hands on thighs SLS 1 on left; unable on right  GAIT: Distance walked: 60 ft in clinic Assistive device utilized: None Level of assistance: Modified independence Comments: slight decreased gait speed  TREATMENT DATE: 08/26/23    physical therapy evaluation and HEP instruction                                                                                                                              PATIENT EDUCATION:  Education details: Patient educated on exam findings, POC, scope of PT, HEP, and what to expect next visit. Person educated: Patient Education method: Explanation, Demonstration, and Handouts Education comprehension: verbalized understanding, returned demonstration, verbal cues required, and tactile cues required   HOME EXERCISE PROGRAM: Access Code: [email or web address] URL: [email or web address] Date: 08/26/2023 Prepared by: [facility] - [facility]  Exercises - Supine Transversus Abdominis Bracing - Hands on Stomach  - 2 x daily - 7 x weekly - 1 sets - 10 reps - 5 sec hold - Hooklying Single Knee to Chest Stretch  - 2 x daily - 7 x weekly - 1 sets - 5 reps - 10 sec hold - Supine Lower Trunk Rotation  - 2 x daily - 7 x weekly - 1 sets - 10 reps  ASSESSMENT:  CLINICAL IMPRESSION: Patient is a 73 y.o. female who was seen today for physical therapy evaluation and treatment for M47.816 (ICD-10-CM) - Spondylosis without myelopathy or radiculopathy, lumbar region M48.061  (ICD-10-CM) - Spinal stenosis, lumbar region without neurogenic claudication M51.360 (ICD-10-CM) - Degeneration of intervertebral disc of lumbar region with discogenic back pain G89.4 (ICD-10-CM) - Chronic pain syndrome. Patient demonstrates muscle weakness, reduced ROM, and fascial restrictions which are likely contributing to symptoms of pain and are negatively impacting patient ability to perform ADLs and functional mobility tasks. Patient will benefit from skilled physical therapy services to address these deficits to reduce pain and improve level of function with ADLs and functional mobility tasks.   OBJECTIVE IMPAIRMENTS: Abnormal gait, decreased balance, difficulty walking, decreased ROM, decreased strength, impaired perceived functional ability, and pain.   ACTIVITY LIMITATIONS: carrying, lifting, bending, sitting, standing, squatting, sleeping, transfers, bed mobility, and caring for others  PARTICIPATION LIMITATIONS: meal prep, cleaning, laundry, shopping, community activity, and yard work  [facility] POTENTIAL: Good  CLINICAL DECISION MAKING: Evolving/moderate complexity  EVALUATION  COMPLEXITY: Moderate   GOALS: Goals reviewed with patient? No  SHORT TERM GOALS: Target date: 09/09/2023  patient will be independent with initial HEP  Baseline: Goal status: INITIAL  2.  Patient will report 30% improvement overall  Baseline:  Goal status: INITIAL   LONG TERM GOALS: Target date: 09/23/2023  Patient will be independent in self management strategies to improve quality of life and functional outcomes.  Baseline:  Goal status: INITIAL  2.  Patient will report 50% improvement overall  Baseline:  Goal status: INITIAL  3.  Patient will able to stand SLS on left leg x 10 to demonstrate improved functional balance  Baseline: see above Goal status: INITIAL  4.  Patient will improve Modified Oswestry score by 6  points to demonstrate improved perceived function  Baseline:  24/50 Goal status: INITIAL  5.   Patient will increase bilateral leg MMT's to 4+ to 5/5 to allow navigation of steps without gait deviation or loss of balance  Baseline:  Goal status: INITIAL  PLAN:  PT FREQUENCY: 2x/week  PT DURATION: 4 weeks  PLANNED INTERVENTIONS: .02835- PT Re-evaluation, 97110-Therapeutic exercises, 97530- Therapeutic activity, 97112- Neuromuscular re-education, 97535- Self Care, 02859- Manual therapy, [id number]- Gait training, [phone]- Orthotic Fit/training, [phone]- Canalith repositioning, [id number]- Aquatic Therapy, 97760- Splinting, [id number]- Wound care (first 20 sq cm), 97598- Wound care (each additional 20 sq cm)Patient/Family education, Balance training, Stair training, Taping, Dry Needling, Joint mobilization, Joint manipulation, Spinal manipulation, Spinal mobilization, Scar mobilization, and DME instructions.   PLAN FOR NEXT SESSION: Review HEP and goals; lumbar mobility; decompression exercises; postural strengthening.    3:23 PM, 08/26/23 Kendrell Lottman Small Jaylanni Eltringham MPT [place] [place] [phone]

## 2023-08-26 ENCOUNTER — Other Ambulatory Visit: Payer: Self-pay

## 2023-08-26 ENCOUNTER — Ambulatory Visit (HOSPITAL_COMMUNITY): Attending: Orthopedic Surgery

## 2023-08-26 DIAGNOSIS — M5459 Other low back pain: Secondary | ICD-10-CM | POA: Insufficient documentation

## 2023-08-26 DIAGNOSIS — R262 Difficulty in walking, not elsewhere classified: Secondary | ICD-10-CM | POA: Insufficient documentation

## 2023-08-30 ENCOUNTER — Encounter: Payer: Self-pay | Admitting: Internal Medicine

## 2023-08-30 ENCOUNTER — Ambulatory Visit: Attending: Internal Medicine | Admitting: Internal Medicine

## 2023-08-30 VITALS — BP 128/72 | HR 88 | Ht 62.0 in | Wt 198.8 lb

## 2023-08-30 DIAGNOSIS — I10 Essential (primary) hypertension: Secondary | ICD-10-CM | POA: Diagnosis not present

## 2023-08-30 DIAGNOSIS — M7989 Other specified soft tissue disorders: Secondary | ICD-10-CM | POA: Diagnosis not present

## 2023-08-30 DIAGNOSIS — Z720 Tobacco use: Secondary | ICD-10-CM | POA: Diagnosis not present

## 2023-08-30 DIAGNOSIS — I739 Peripheral vascular disease, unspecified: Secondary | ICD-10-CM | POA: Insufficient documentation

## 2023-08-30 MED ORDER — ASPIRIN 81 MG PO TBEC: 81.0000 mg | DELAYED_RELEASE_TABLET | Freq: Every day | ORAL | Status: AC

## 2023-08-30 NOTE — Progress Notes (Signed)
 Cardiology Office Note  Date: 08/30/2023   ID: Melissa Weaver, DOB 21-Apr-1950, MRN [id number]  PCP:  Suanne Pfeiffer, NP  Cardiologist:  None Electrophysiologist:  None   History of Present Illness: Melissa Weaver is a 73 y.o. female  Referred to cardiology clinic for evaluation of bilateral leg swelling.  Accompanied by patient sister.  Collateral history is provided by patient's sister.  Patient has chronic DOE and bilateral leg swelling.  She reported the leg swelling had gotten worse but that resolved after her PCP started her on p.o. Lasix 20 mg once daily.  No leg swelling today.  DOE did not improve after Lasix administration.  No orthopnea or PND but she sleeps with her head end of the bed elevated.  No prior history of MI/PCI/CABG.  She underwent ABI testing that showed borderline PAD of left lower extremity.  Past Medical History:  Diagnosis Date   Anxiety    Arthritis    Asthma    Complication of anesthesia    COPD (chronic obstructive pulmonary disease) (HCC)    Depression    Family history of adverse reaction to anesthesia    son had postoperative N&V   Family history of colon cancer    Family history of melanoma    Family history of pancreatic cancer    GERD (gastroesophageal reflux disease)    HLD (hyperlipidemia)    HTN (hypertension)    Melanoma (HCC)    Personal history of radiation therapy    2019 left breast   PONV (postoperative nausea and vomiting)    Seizures (HCC)    had a seizure as child, unknown etiology, no meds though. No seizures since 10 years.    Past Surgical History:  Procedure Laterality Date   BREAST LUMPECTOMY Left 05/11/2017   intermediate grade DCIS, margins negative   COLONOSCOPY N/A 08/16/2023   Procedure: COLONOSCOPY;  Surgeon: Cindie Carlin POUR, DO;  Location: [facility];  Service: Endoscopy;  Laterality: N/A;  10:15 am, asa 2   ESOPHAGEAL DILATION N/A 08/16/2023   Procedure: DILATION, ESOPHAGUS;  Surgeon: Cindie Carlin POUR, DO;  Location: [facility];  Service: Endoscopy;  Laterality: N/A;   ESOPHAGOGASTRODUODENOSCOPY N/A 08/16/2023   Procedure: EGD (ESOPHAGOGASTRODUODENOSCOPY);  Surgeon: Cindie Carlin POUR, DO;  Location: [facility];  Service: Endoscopy;  Laterality: N/A;   PAROTIDECTOMY Right 11/13/2021   Procedure: SUPERFICIAL PAROTIDECTOMY WITH FACIAL NERVE DISSECTION;  Surgeon: Jesus Oliphant, MD;  Location: [facility] OR;  Service: ENT;  Laterality: Right;   PARTIAL MASTECTOMY WITH NEEDLE LOCALIZATION Left 05/11/2017   Procedure: PARTIAL MASTECTOMY WITH NEEDLE LOCALIZATION;  Surgeon: Kallie Manuelita BROCKS, MD;  Location: [facility];  Service: General;  Laterality: Left;   TUBAL LIGATION      Current Outpatient Medications  Medication Sig Dispense Refill   acetaminophen  (TYLENOL ) 650 MG CR tablet Take 1,300 mg by mouth daily as needed for pain.     albuterol (VENTOLIN HFA) 108 (90 Base) MCG/ACT inhaler Inhale 2 puffs into the lungs every 4 (four) hours as needed.     ALPRAZolam  (XANAX ) 0.25 MG tablet Take 0.25 mg by mouth See admin instructions. Take 1 tablet (0.25 mg) by mouth scheduled every morning & may take an additional 2 doses as needed for anxiety.     BREZTRI AEROSPHERE 160-9-4.8 MCG/ACT AERO inhaler 2 puffs.     Cholecalciferol  125 MCG (5000 UT) capsule Take 5,000 Units by mouth daily.     denosumab  (PROLIA ) 60 MG/ML SOLN injection Inject  60 mg into the skin every 6 (six) months. Administer in upper arm, thigh, or abdomen     escitalopram  (LEXAPRO ) 10 MG tablet Take 10 mg by mouth in the morning.     furosemide (LASIX) 20 MG tablet Take 1 tablet by mouth daily as needed.     GEMTESA 75 MG TABS Take 1 tablet by mouth daily.     inclisiran (LEQVIO ) 284 MG/1.5ML SOSY injection Inject 284 mg into the skin.     lisinopril (ZESTRIL) 10 MG tablet Take by mouth.     Magnesium  250 MG TABS Take 250 mg by mouth every evening.     omeprazole (PRILOSEC) 20 MG capsule Take 20 mg by mouth daily.     Powders (ANTI  MONKEY BUTT) POWD Apply 1 Application topically daily as needed (irritation (skin folds--breast area)).     traMADol (ULTRAM) 50 MG tablet Take 1 tablet by mouth 3 (three) times daily as needed.     REPATHA SURECLICK 140 MG/ML SOAJ  (Patient not taking: Reported on 08/30/2023)     triamcinolone  cream (KENALOG ) 0.1 % Apply 1 Application topically 2 (two) times daily. 45 g 0   No current facility-administered medications for this visit.   Allergies:  Patient has no known allergies.   Social History: The patient  reports that she has been smoking cigarettes. She has a 40 pack-year smoking history. She has never used smokeless tobacco. She reports that she does not drink alcohol  and does not use drugs.   Family History: The patient's family history includes Atrial fibrillation in her father; COPD in her father; Colon cancer in an other family member; Heart attack in her maternal uncle; Heart attack (age of onset: 52) in her maternal grandfather; High Cholesterol in her father; Hypertension in her father; Leukemia in her maternal uncle; Lung cancer in her maternal aunt; Lung cancer (age of onset: 47) in her maternal grandfather; Melanoma in an other family member; Pancreatic cancer in her maternal uncle; Pancreatic cancer (age of onset: 1) in her mother; Pancreatic cancer (age of onset: 35) in her maternal aunt.   ROS:  Please see the history of present illness. Otherwise, complete review of systems is positive for none  All other systems are reviewed and negative.   Physical Exam: VS:  BP 128/72 (BP Location: Right Arm, Cuff Size: Normal)   Pulse 88   Ht 5' 2 (1.575 m)   Wt 198 lb 12.8 oz (90.2 kg)   SpO2 97%   BMI 36.36 kg/m , BMI Body mass index is 36.36 kg/m.  Wt Readings from Last 3 Encounters:  08/30/23 198 lb 12.8 oz (90.2 kg)  08/16/23 202 lb (91.6 kg)  06/15/23 203 lb 9.6 oz (92.4 kg)    General: Patient appears comfortable at rest. HEENT: Conjunctiva and lids normal, oropharynx  clear with moist mucosa. Neck: Supple, no elevated JVP or carotid bruits, no thyromegaly. Lungs: Clear to auscultation, nonlabored breathing at rest. Cardiac: Regular rate and rhythm, no S3 or significant systolic murmur, no pericardial rub. Abdomen: Soft, nontender, no hepatomegaly, bowel sounds present, no guarding or rebound. Extremities: No pitting edema, distal pulses 2+. Skin: Warm and dry. Musculoskeletal: No kyphosis. Neuropsychiatric: Alert and oriented x3, affect grossly appropriate.  Recent Labwork: 04/15/2023: ALT 30; AST 25; Hemoglobin 13.5; Platelets 324 08/11/2023: BUN 11; Creatinine, Ser 0.81; Potassium 4.5; Sodium 140  No results found for: CHOL, TRIG, HDL, CHOLHDL, VLDL, LDLCALC, LDLDIRECT   Assessment and Plan:   Left lower extremity PAD: Borderline  ABI 0.93.  No claudication.  Start aspirin  81 mg once daily.  Continue Leqvio .  Smoking cessation counseling provided.  Leg swelling: Leg swelling improved after Lasix administration but DOE did not.  No leg swelling today.  Rule out CHF.  Obtain echocardiogram.  Continue p.o. Lasix 20 mg once daily.  Chronic venous insufficiency is also in the differential.  HTN, controlled: Continue current antihypertensives, lisinopril 10 mg once daily.  Nicotine  abuse: Current smoker.  Trying very hard to quit.   I spent 45 minutes in reviewing the records, tests, imaging, records, discussion of PAD and leg swelling with the patient, and recommendation.  I answered all her questions.      Medication Adjustments/Labs and Tests Ordered: Current medicines are reviewed at length with the patient today.  Concerns regarding medicines are outlined above.    Disposition:  Follow up 6 months  Signed Hope Brandenburger Priya Kiril Hippe, MD, 08/30/2023 11:51 AM    [facility] at [facility] [address] [place], [place], [place] [address]

## 2023-08-30 NOTE — Patient Instructions (Signed)
 Medication Instructions:   Begin Aspirin 81mg  daily  Continue all other medications.     Labwork:  none  Testing/Procedures:  Your physician has requested that you have an echocardiogram. Echocardiography is a painless test that uses sound waves to create images of your heart. It provides your doctor with information about the size and shape of your heart and how well your heart's chambers and valves are working. This procedure takes approximately one hour. There are no restrictions for this procedure. Please do NOT wear cologne, perfume, aftershave, or lotions (deodorant is allowed). Please arrive 15 minutes prior to your appointment time.  Please note: We ask at that you not bring children with you during ultrasound (echo/ vascular) testing. Due to room size and safety concerns, children are not allowed in the ultrasound rooms during exams. Our front office staff cannot provide observation of children in our lobby area while testing is being conducted. An adult accompanying a patient to their appointment will only be allowed in the ultrasound room at the discretion of the ultrasound technician under special circumstances. We apologize for any inconvenience. Office will contact with results via phone, letter or mychart.     Follow-Up:  6 months   Any Other Special Instructions Will Be Listed Below (If Applicable).   If you need a refill on your cardiac medications before your next appointment, please call your pharmacy.

## 2023-09-02 ENCOUNTER — Encounter (HOSPITAL_COMMUNITY): Payer: Self-pay

## 2023-09-02 ENCOUNTER — Ambulatory Visit (HOSPITAL_COMMUNITY)

## 2023-09-02 DIAGNOSIS — M5459 Other low back pain: Secondary | ICD-10-CM | POA: Diagnosis not present

## 2023-09-02 DIAGNOSIS — R262 Difficulty in walking, not elsewhere classified: Secondary | ICD-10-CM

## 2023-09-02 NOTE — Therapy (Signed)
 OUTPATIENT PHYSICAL THERAPY THORACOLUMBAR EVALUATION   Patient Name: Melissa Weaver MRN: [id number] DOB:27-Jan-1950, 73 y.o., female Today's Date: 09/02/2023  END OF SESSION:  PT End of Session - 09/02/23 1518     Visit Number 2    Number of Visits 8    Date for PT Re-Evaluation 09/23/23    Authorization Type HTA    Authorization Time Period no auth needed    PT Start Time 1520    PT Stop Time 1610    PT Time Calculation (min) 50 min    Activity Tolerance Patient tolerated treatment well;No increased pain    Behavior During Therapy WFL for tasks assessed/performed          Past Medical History:  Diagnosis Date   Anxiety    Arthritis    Asthma    Complication of anesthesia    COPD (chronic obstructive pulmonary disease) (HCC)    Depression    Family history of adverse reaction to anesthesia    son had postoperative N&V   Family history of colon cancer    Family history of melanoma    Family history of pancreatic cancer    GERD (gastroesophageal reflux disease)    HLD (hyperlipidemia)    HTN (hypertension)    Melanoma (HCC)    Personal history of radiation therapy    2019 left breast   PONV (postoperative nausea and vomiting)    Seizures (HCC)    had a seizure as child, unknown etiology, no meds though. No seizures since 10 years.   Past Surgical History:  Procedure Laterality Date   BREAST LUMPECTOMY Left 05/11/2017   intermediate grade DCIS, margins negative   COLONOSCOPY N/A 08/16/2023   Procedure: COLONOSCOPY;  Surgeon: Cindie Carlin POUR, DO;  Location: [facility];  Service: Endoscopy;  Laterality: N/A;  10:15 am, asa 2   ESOPHAGEAL DILATION N/A 08/16/2023   Procedure: DILATION, ESOPHAGUS;  Surgeon: Cindie Carlin POUR, DO;  Location: [facility];  Service: Endoscopy;  Laterality: N/A;   ESOPHAGOGASTRODUODENOSCOPY N/A 08/16/2023   Procedure: EGD (ESOPHAGOGASTRODUODENOSCOPY);  Surgeon: Cindie Carlin POUR, DO;  Location: [facility];  Service: Endoscopy;   Laterality: N/A;   PAROTIDECTOMY Right 11/13/2021   Procedure: SUPERFICIAL PAROTIDECTOMY WITH FACIAL NERVE DISSECTION;  Surgeon: Jesus Oliphant, MD;  Location: [facility];  Service: ENT;  Laterality: Right;   PARTIAL MASTECTOMY WITH NEEDLE LOCALIZATION Left 05/11/2017   Procedure: PARTIAL MASTECTOMY WITH NEEDLE LOCALIZATION;  Surgeon: Kallie Manuelita BROCKS, MD;  Location: [facility];  Service: General;  Laterality: Left;   TUBAL LIGATION     Patient Active Problem List   Diagnosis Date Noted   PAD (peripheral artery disease) (HCC) 08/30/2023   Leg swelling 08/30/2023   Nicotine  abuse 08/30/2023   GERD (gastroesophageal reflux disease) 06/15/2023   Dysphagia 06/15/2023   Lower abdominal pain 06/15/2023   OP (osteoporosis) 12/23/2021   Parotid mass 11/13/2021   Genetic testing 09/06/2017   Family history of pancreatic cancer    Family history of colon cancer    Family history of melanoma    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   HLD (hyperlipidemia) 04/07/2017   Anxiety state 04/07/2017   Depression 04/07/2017   HTN (hypertension) 04/07/2017    PCP: Suanne Pfeiffer, NP  REFERRING PROVIDER: Mylinda Forest, Hart CHRISTELLA, NP  REFERRING DIAG: [phone] (ICD-10-CM) - Spondylosis without myelopathy or radiculopathy, lumbar region M48.061 (ICD-10-CM) - Spinal stenosis, lumbar region without neurogenic claudication M51.360 (ICD-10-CM) - Degeneration of intervertebral disc of lumbar region  with discogenic back pain G89.4 (ICD-10-CM) - Chronic pain syndrome  Rationale for Evaluation and Treatment: Rehabilitation  THERAPY DIAG:  Other low back pain  Difficulty in walking, not elsewhere classified  ONSET DATE: chronic but worse since Jan 2025  SUBJECTIVE:                                                                                                                                                                                           SUBJECTIVE STATEMENT: 09/02/23:  Constant LBP 5-6/10 achy  pain across lower back and some pain in shoulder blades.  Used lumbar support that assist in sitting.  Has not began the exercises yet, stated she didn't try on bed.  Returns on Monday for shot in lower back.  Eval:  Chronic pain but worse since Jan 2025.  Had a fall the last week in Jan.  Seems to have irritated everything. Saw PCP first and had a scan and then went to [employer] [facility]. PCP sent to back doctor and had MRI and then had an injection.  Injection helped a little.  Dr. Lear office then referred to therapy  PERTINENT HISTORY:  Bit by a tick recently and had to take meds States her balance is not good at baseline  PAIN:  Are you having pain? Yes: NPRS scale: 5/10 Pain location: low back and posterior hips Pain description: aches Aggravating factors: walking  Relieving factors: pain medication  PRECAUTIONS: None  RED FLAGS: None   WEIGHT BEARING RESTRICTIONS: No  FALLS:  Has patient fallen in last 6 months? No   OCCUPATION: retired  PLOF: Independent  PATIENT GOALS: less pain  NEXT MD VISIT: 09/15/23  OBJECTIVE:  Note: Objective measures were completed at Evaluation unless otherwise noted.  DIAGNOSTIC FINDINGS:  CLINICAL DATA:  Low back pain   EXAM: MRI LUMBAR SPINE WITHOUT CONTRAST   TECHNIQUE: Multiplanar, multisequence MR imaging of the lumbar spine was performed. No intravenous contrast was administered.   COMPARISON:  CT 04/15/2023   FINDINGS: Segmentation:  5 lumbar type vertebral bodies.   Alignment: No significant malalignment. 1-2 mm degenerative anterolisthesis L4-5.   Vertebrae: No fracture or focal bone lesion. No edematous endplate marrow changes or edematous facet arthritis.   Conus medullaris and cauda equina: Conus extends to the L1 level. Conus and cauda equina appear normal.   Paraspinal and other soft tissues: Negative   Disc levels:   No significant finding from T11-12 through L2-3: Minimal disc bulges but no  compressive narrowing of the canal or foramina.   L3-4: Shallow protrusion of the disc. Facet and ligamentous hypertrophy. Multifactorial stenosis particularly affecting the lateral recesses.  Definite neural compression is not established, but the lateral recess stenosis could potentially be symptomatic.   L4-5: Disc degeneration with shallow protrusion, more towards the right, with a foraminal to extraforaminal disc osteophyte complex. Facet degeneration and hypertrophy with 1-2 mm of anterolisthesis. No compressive central canal stenosis. Narrowing of both subarticular lateral recesses. Foraminal to extraforaminal encroachment on the right that could affect the right L4 nerve. The facet arthritis could be a cause of back pain or referred facet syndrome pain.   L5-S1: Endplate osteophytes and small left posterolateral to foraminal disc herniation. No central canal stenosis. The L5 nerve appears to exit without gross compression, but left L5 nerve irritation could possibly occur.   IMPRESSION: 1. L3-4: Shallow protrusion of the disc. Facet and ligamentous hypertrophy. Multifactorial stenosis particularly affecting the lateral recesses. Definite neural compression is not established, but the lateral recess stenosis could possibly be symptomatic. 2. L4-5: Shallow protrusion of the disc more towards the right, with a right foraminal to extraforaminal disc osteophyte complex. Facet degeneration and hypertrophy with 1-2 mm of anterolisthesis. Narrowing of both subarticular lateral recesses. Foraminal to extraforaminal encroachment on the right by prominent disc osteophyte complex that could affect the right L4 nerve. The facet arthritis could be a cause of back pain or referred facet syndrome pain. 3. L5-S1: Endplate osteophytes and small left posterolateral to foraminal disc herniation. No central canal stenosis. The L5 nerve appears to exit without gross compression, but left L5  nerve irritation could possibly occur.     Electronically Signed   By: Oneil Officer M.D.   On: 06/25/2023 11:42  PATIENT SURVEYS:  Modified Oswestry:  MODIFIED OSWESTRY DISABILITY SCALE  Date: 08/26/23 Score                                Total 24/50; 48%   Interpretation of scores: Score Category Description  0-20% Minimal Disability The patient can cope with most living activities. Usually no treatment is indicated apart from advice on lifting, sitting and exercise  21-40% Moderate Disability The patient experiences more pain and difficulty with sitting, lifting and standing. Travel and social life are more difficult and they may be disabled from work. Personal care, sexual activity and sleeping are not grossly affected, and the patient can usually be managed by conservative means  41-60% Severe Disability Pain remains the main problem in this group, but activities of daily living are affected. These patients require a detailed investigation  61-80% Crippled Back pain impinges on all aspects of the patient's life. Positive intervention is required  81-100% Bed-bound  These patients are either bed-bound or exaggerating their symptoms  Bluford FORBES Zoe DELENA Karon DELENA, et al. Surgery versus conservative management of stable thoracolumbar fracture: the PRESTO feasibility RCT. [place]): [employer]; 2021 Nov. [facility] Technology Assessment, No. 25.62.) Appendix 3, Oswestry Disability Index category descriptors. Available from: FindJewel[employer].cz  Minimally Clinically Important Difference (MCID) = 12.8%  COGNITION: Overall cognitive status: Within functional limits for tasks assessed     SENSATION: WFL  MUSCLE LENGTH: Slightly tighter right > left  POSTURE: rounded shoulders and forward head  PALPATION: Tender low back paraspinals; quadratus lumbarom  LUMBAR ROM: * pain  AROM eval  Flexion Fingertips to mid shin*  Extension  20% available*  Right lateral flexion To 1 above knee joint line  Left lateral flexion To 1 above knee joint line  Right rotation   Left rotation    (  Blank rows = not tested)  LOWER EXTREMITY ROM:     Active  Right eval Left eval  Hip flexion    Hip extension    Hip abduction    Hip adduction    Hip internal rotation    Hip external rotation    Knee flexion    Knee extension    Ankle dorsiflexion    Ankle plantarflexion    Ankle inversion    Ankle eversion     (Blank rows = not tested)  LOWER EXTREMITY MMT:    MMT Right eval Left eval  Hip flexion 4 4+  Hip extension 4- 4-  Hip abduction    Hip adduction    Hip internal rotation    Hip external rotation    Knee flexion 4 4  Knee extension 4+ 5  Ankle dorsiflexion 5 5  Ankle plantarflexion    Ankle inversion    Ankle eversion     (Blank rows = not tested)   FUNCTIONAL TESTS:  5 times sit to stand: 27.01 hands on thighs SLS 1 on left; unable on right  GAIT: Distance walked: 60 ft in clinic Assistive device utilized: None Level of assistance: Modified independence Comments: slight decreased gait speed  TREATMENT DATE: 09/02/23:  Reviewed goals  Reviewed importance of HEP compliance that was reviewed this session  Supine:  Log rolling  Diaphragmatic breathing with 1 hand on stomach and 1 on chest x 3 min  TrA activation paired with breathing out x 5 min  SKTC 5x 5  LTR 5x 10  Posterior pelvic tilt with verbal and tactile cueing 5x 5  Decompression 2-5 5x 5   08/26/23    physical therapy evaluation and HEP instruction                                                                                                                              PATIENT EDUCATION:  Education details: Patient educated on exam findings, POC, scope of PT, HEP, and what to expect next visit. Person educated: Patient Education method: Explanation, Demonstration, and Handouts Education comprehension: verbalized  understanding, returned demonstration, verbal cues required, and tactile cues required   HOME EXERCISE PROGRAM: Access Code: [email or web address] URL: [email or web address] Date: 08/26/2023 Prepared by: [facility]  Exercises - Supine Transversus Abdominis Bracing - Hands on Stomach  - 2 x daily - 7 x weekly - 1 sets - 10 reps - 5 sec hold - Hooklying Single Knee to Chest Stretch  - 2 x daily - 7 x weekly - 1 sets - 5 reps - 10 sec hold - Supine Lower Trunk Rotation  - 2 x daily - 7 x weekly - 1 sets - 10 reps  09/02/23:  Decompression  ASSESSMENT:  CLINICAL IMPRESSION: 09/02/23:  Reviewed goals and educated importance of HEP compliance for maximal benefits.  Pt reports she has not began the exercises yet as thought they would be done best on floor, encouraged  pt to complete on bed as feel would be difficulty to get up from floor, verbalized understanding.  Reviewed current exercise program with NMR cueing to improve TrA activation and to continue breathing.  Educated importance of posture for pain control.  Added decompression with verbal and tactile cueing to improve scapular and cervical retraction.    Eval:  Patient is a 73 y.o. female who was seen today for physical therapy evaluation and treatment for M47.816 (ICD-10-CM) - Spondylosis without myelopathy or radiculopathy, lumbar region M48.061 (ICD-10-CM) - Spinal stenosis, lumbar region without neurogenic claudication M51.360 (ICD-10-CM) - Degeneration of intervertebral disc of lumbar region with discogenic back pain G89.4 (ICD-10-CM) - Chronic pain syndrome. Patient demonstrates muscle weakness, reduced ROM, and fascial restrictions which are likely contributing to symptoms of pain and are negatively impacting patient ability to perform ADLs and functional mobility tasks. Patient will benefit from skilled physical therapy services to address these deficits to reduce pain and improve level of function with ADLs and functional mobility  tasks.   OBJECTIVE IMPAIRMENTS: Abnormal gait, decreased balance, difficulty walking, decreased ROM, decreased strength, impaired perceived functional ability, and pain.   ACTIVITY LIMITATIONS: carrying, lifting, bending, sitting, standing, squatting, sleeping, transfers, bed mobility, and caring for others  PARTICIPATION LIMITATIONS: meal prep, cleaning, laundry, shopping, community activity, and yard work  [facility] POTENTIAL: Good  CLINICAL DECISION MAKING: Evolving/moderate complexity  EVALUATION COMPLEXITY: Moderate   GOALS: Goals reviewed with patient? No  SHORT TERM GOALS: Target date: 09/09/2023  patient will be independent with initial HEP  Baseline: Goal status: INITIAL  2.  Patient will report 30% improvement overall  Baseline:  Goal status: INITIAL   LONG TERM GOALS: Target date: 09/23/2023  Patient will be independent in self management strategies to improve quality of life and functional outcomes.  Baseline:  Goal status: INITIAL  2.  Patient will report 50% improvement overall  Baseline:  Goal status: INITIAL  3.  Patient will able to stand SLS on left leg x 10 to demonstrate improved functional balance  Baseline: see above Goal status: INITIAL  4.  Patient will improve Modified Oswestry score by 6  points to demonstrate improved perceived function  Baseline: 24/50 Goal status: INITIAL  5.   Patient will increase bilateral leg MMT's to 4+ to 5/5 to allow navigation of steps without gait deviation or loss of balance  Baseline:  Goal status: INITIAL  PLAN:  PT FREQUENCY: 2x/week  PT DURATION: 4 weeks  PLANNED INTERVENTIONS: .02835- PT Re-evaluation, 97110-Therapeutic exercises, 97530- Therapeutic activity, 97112- Neuromuscular re-education, 97535- Self Care, 02859- Manual therapy, [id number]- Gait training, [phone]- Orthotic Fit/training, [phone]- Canalith repositioning, [id number]- Aquatic Therapy, 97760- Splinting, [id number]- Wound care (first 20 sq cm),  97598- Wound care (each additional 20 sq cm)Patient/Family education, Balance training, Stair training, Taping, Dry Needling, Joint mobilization, Joint manipulation, Spinal manipulation, Spinal mobilization, Scar mobilization, and DME instructions.   PLAN FOR NEXT SESSION: lumbar mobility; Review decompression exercises; postural strengthening.    Augustin Mclean, LPTA/CLT; CBIS [phone]  4:16 PM, 09/02/23

## 2023-09-08 ENCOUNTER — Ambulatory Visit (HOSPITAL_COMMUNITY)

## 2023-09-08 ENCOUNTER — Encounter (HOSPITAL_COMMUNITY): Payer: Self-pay

## 2023-09-08 DIAGNOSIS — M5459 Other low back pain: Secondary | ICD-10-CM | POA: Diagnosis not present

## 2023-09-08 DIAGNOSIS — R262 Difficulty in walking, not elsewhere classified: Secondary | ICD-10-CM

## 2023-09-08 NOTE — Therapy (Signed)
 OUTPATIENT PHYSICAL THERAPY THORACOLUMBAR TREATMENT   Patient Name: Melissa Weaver MRN: [id number] DOB:January 18, 1951, 73 y.o., female Today's Date: 09/08/2023  END OF SESSION:  PT End of Session - 09/08/23 1438     Visit Number 3    Number of Visits 8    Date for PT Re-Evaluation 09/23/23    Authorization Type HTA    Authorization Time Period no auth needed    PT Start Time 1437    PT Stop Time 1520    PT Time Calculation (min) 43 min    Activity Tolerance Patient tolerated treatment well;No increased pain    Behavior During Therapy WFL for tasks assessed/performed          Past Medical History:  Diagnosis Date   Anxiety    Arthritis    Asthma    Complication of anesthesia    COPD (chronic obstructive pulmonary disease) (HCC)    Depression    Family history of adverse reaction to anesthesia    son had postoperative N&V   Family history of colon cancer    Family history of melanoma    Family history of pancreatic cancer    GERD (gastroesophageal reflux disease)    HLD (hyperlipidemia)    HTN (hypertension)    Melanoma (HCC)    Personal history of radiation therapy    2019 left breast   PONV (postoperative nausea and vomiting)    Seizures (HCC)    had a seizure as child, unknown etiology, no meds though. No seizures since 10 years.   Past Surgical History:  Procedure Laterality Date   BREAST LUMPECTOMY Left 05/11/2017   intermediate grade DCIS, margins negative   COLONOSCOPY N/A 08/16/2023   Procedure: COLONOSCOPY;  Surgeon: Cindie Carlin POUR, DO;  Location: [facility];  Service: Endoscopy;  Laterality: N/A;  10:15 am, asa 2   ESOPHAGEAL DILATION N/A 08/16/2023   Procedure: DILATION, ESOPHAGUS;  Surgeon: Cindie Carlin POUR, DO;  Location: [facility];  Service: Endoscopy;  Laterality: N/A;   ESOPHAGOGASTRODUODENOSCOPY N/A 08/16/2023   Procedure: EGD (ESOPHAGOGASTRODUODENOSCOPY);  Surgeon: Cindie Carlin POUR, DO;  Location: [facility];  Service: Endoscopy;   Laterality: N/A;   PAROTIDECTOMY Right 11/13/2021   Procedure: SUPERFICIAL PAROTIDECTOMY WITH FACIAL NERVE DISSECTION;  Surgeon: Jesus Oliphant, MD;  Location: [facility];  Service: ENT;  Laterality: Right;   PARTIAL MASTECTOMY WITH NEEDLE LOCALIZATION Left 05/11/2017   Procedure: PARTIAL MASTECTOMY WITH NEEDLE LOCALIZATION;  Surgeon: Kallie Manuelita BROCKS, MD;  Location: [facility];  Service: General;  Laterality: Left;   TUBAL LIGATION     Patient Active Problem List   Diagnosis Date Noted   PAD (peripheral artery disease) (HCC) 08/30/2023   Leg swelling 08/30/2023   Nicotine  abuse 08/30/2023   GERD (gastroesophageal reflux disease) 06/15/2023   Dysphagia 06/15/2023   Lower abdominal pain 06/15/2023   OP (osteoporosis) 12/23/2021   Parotid mass 11/13/2021   Genetic testing 09/06/2017   Family history of pancreatic cancer    Family history of colon cancer    Family history of melanoma    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   HLD (hyperlipidemia) 04/07/2017   Anxiety state 04/07/2017   Depression 04/07/2017   HTN (hypertension) 04/07/2017    PCP: Suanne Pfeiffer, NP  REFERRING PROVIDER: Mylinda Forest, Hart CHRISTELLA, NP  REFERRING DIAG: [phone] (ICD-10-CM) - Spondylosis without myelopathy or radiculopathy, lumbar region M48.061 (ICD-10-CM) - Spinal stenosis, lumbar region without neurogenic claudication M51.360 (ICD-10-CM) - Degeneration of intervertebral disc of lumbar region  with discogenic back pain G89.4 (ICD-10-CM) - Chronic pain syndrome  Rationale for Evaluation and Treatment: Rehabilitation  THERAPY DIAG:  Other low back pain  Difficulty in walking, not elsewhere classified  ONSET DATE: chronic but worse since Jan 2025  SUBJECTIVE:                                                                                                                                                                                           SUBJECTIVE STATEMENT: 09/07/23:  Reports increased pain Lt  hip followng lifting heavy box of cat food.  Current pain scale 6/10 Lt hip today, achy and burning.  Has began the HEP every other day without questions.  Reports increased pain walking.  Eval:  Chronic pain but worse since Jan 2025.  Had a fall the last week in Jan.  Seems to have irritated everything. Saw PCP first and had a scan and then went to [employer] [facility]. PCP sent to back doctor and had MRI and then had an injection.  Injection helped a little.  Dr. Lear office then referred to therapy  PERTINENT HISTORY:  Bit by a tick recently and had to take meds States her balance is not good at baseline  PAIN:  Are you having pain? Yes: NPRS scale: 6/10 Pain location: left hip Pain description: aches Aggravating factors: walking  Relieving factors: pain medication  PRECAUTIONS: None  RED FLAGS: None   WEIGHT BEARING RESTRICTIONS: No  FALLS:  Has patient fallen in last 6 months? No   OCCUPATION: retired  PLOF: Independent  PATIENT GOALS: less pain  NEXT MD VISIT: 09/15/23  OBJECTIVE:  Note: Objective measures were completed at Evaluation unless otherwise noted.  DIAGNOSTIC FINDINGS:  CLINICAL DATA:  Low back pain   EXAM: MRI LUMBAR SPINE WITHOUT CONTRAST   TECHNIQUE: Multiplanar, multisequence MR imaging of the lumbar spine was performed. No intravenous contrast was administered.   COMPARISON:  CT 04/15/2023   FINDINGS: Segmentation:  5 lumbar type vertebral bodies.   Alignment: No significant malalignment. 1-2 mm degenerative anterolisthesis L4-5.   Vertebrae: No fracture or focal bone lesion. No edematous endplate marrow changes or edematous facet arthritis.   Conus medullaris and cauda equina: Conus extends to the L1 level. Conus and cauda equina appear normal.   Paraspinal and other soft tissues: Negative   Disc levels:   No significant finding from T11-12 through L2-3: Minimal disc bulges but no compressive narrowing of the canal or foramina.    L3-4: Shallow protrusion of the disc. Facet and ligamentous hypertrophy. Multifactorial stenosis particularly affecting the lateral recesses. Definite neural compression is not established, but the lateral  recess stenosis could potentially be symptomatic.   L4-5: Disc degeneration with shallow protrusion, more towards the right, with a foraminal to extraforaminal disc osteophyte complex. Facet degeneration and hypertrophy with 1-2 mm of anterolisthesis. No compressive central canal stenosis. Narrowing of both subarticular lateral recesses. Foraminal to extraforaminal encroachment on the right that could affect the right L4 nerve. The facet arthritis could be a cause of back pain or referred facet syndrome pain.   L5-S1: Endplate osteophytes and small left posterolateral to foraminal disc herniation. No central canal stenosis. The L5 nerve appears to exit without gross compression, but left L5 nerve irritation could possibly occur.   IMPRESSION: 1. L3-4: Shallow protrusion of the disc. Facet and ligamentous hypertrophy. Multifactorial stenosis particularly affecting the lateral recesses. Definite neural compression is not established, but the lateral recess stenosis could possibly be symptomatic. 2. L4-5: Shallow protrusion of the disc more towards the right, with a right foraminal to extraforaminal disc osteophyte complex. Facet degeneration and hypertrophy with 1-2 mm of anterolisthesis. Narrowing of both subarticular lateral recesses. Foraminal to extraforaminal encroachment on the right by prominent disc osteophyte complex that could affect the right L4 nerve. The facet arthritis could be a cause of back pain or referred facet syndrome pain. 3. L5-S1: Endplate osteophytes and small left posterolateral to foraminal disc herniation. No central canal stenosis. The L5 nerve appears to exit without gross compression, but left L5 nerve irritation could possibly occur.      Electronically Signed   By: Oneil Officer M.D.   On: 06/25/2023 11:42  PATIENT SURVEYS:  Modified Oswestry:  MODIFIED OSWESTRY DISABILITY SCALE  Date: 08/26/23 Score                                Total 24/50; 48%   Interpretation of scores: Score Category Description  0-20% Minimal Disability The patient can cope with most living activities. Usually no treatment is indicated apart from advice on lifting, sitting and exercise  21-40% Moderate Disability The patient experiences more pain and difficulty with sitting, lifting and standing. Travel and social life are more difficult and they may be disabled from work. Personal care, sexual activity and sleeping are not grossly affected, and the patient can usually be managed by conservative means  41-60% Severe Disability Pain remains the main problem in this group, but activities of daily living are affected. These patients require a detailed investigation  61-80% Crippled Back pain impinges on all aspects of the patient's life. Positive intervention is required  81-100% Bed-bound  These patients are either bed-bound or exaggerating their symptoms  Bluford FORBES Zoe DELENA Karon DELENA, et al. Surgery versus conservative management of stable thoracolumbar fracture: the PRESTO feasibility RCT. [place] ([place]): [employer]; 2021 Nov. [facility] Technology Assessment, No. 25.62.) Appendix 3, Oswestry Disability Index category descriptors. Available from: FindJewel[employer].cz  Minimally Clinically Important Difference (MCID) = 12.8%  COGNITION: Overall cognitive status: Within functional limits for tasks assessed     SENSATION: WFL  MUSCLE LENGTH: Slightly tighter right > left  POSTURE: rounded shoulders and forward head  PALPATION: Tender low back paraspinals; quadratus lumbarom  LUMBAR ROM: * pain  AROM eval  Flexion Fingertips to mid shin*  Extension 20% available*  Right lateral flexion To 1  above knee joint line  Left lateral flexion To 1 above knee joint line  Right rotation   Left rotation    (Blank rows = not tested)  LOWER  EXTREMITY ROM:     Active  Right eval Left eval  Hip flexion    Hip extension    Hip abduction    Hip adduction    Hip internal rotation    Hip external rotation    Knee flexion    Knee extension    Ankle dorsiflexion    Ankle plantarflexion    Ankle inversion    Ankle eversion     (Blank rows = not tested)  LOWER EXTREMITY MMT:    MMT Right eval Left eval  Hip flexion 4 4+  Hip extension 4- 4-  Hip abduction    Hip adduction    Hip internal rotation    Hip external rotation    Knee flexion 4 4  Knee extension 4+ 5  Ankle dorsiflexion 5 5  Ankle plantarflexion    Ankle inversion    Ankle eversion     (Blank rows = not tested)   FUNCTIONAL TESTS:  5 times sit to stand: 27.01 hands on thighs SLS 1 on left; unable on right  GAIT: Distance walked: 60 ft in clinic Assistive device utilized: None Level of assistance: Modified independence Comments: slight decreased gait speed  TREATMENT DATE: 09/07/23:  Laurene UE/LE [place] x 5' SPM 65 Supine:  SKTC 3x 20  Glut sets 15x 5  Piriformis stretch 90/90 2x 30  Hamstring stretch 2x 30 Decompression with RTB 5x each direction (4) Sit to stand with no HHA 10x eccentric control    09/02/23:  Reviewed goals  Reviewed importance of HEP compliance that was reviewed this session  Supine:  Log rolling  Diaphragmatic breathing with 1 hand on stomach and 1 on chest x 3 min  TrA activation paired with breathing out x 5 min  SKTC 5x 5  LTR 5x 10  Posterior pelvic tilt with verbal and tactile cueing 5x 5  Decompression 2-5 5x 5   08/26/23    physical therapy evaluation and HEP instruction                                                                                                                              PATIENT EDUCATION:  Education details: Patient  educated on exam findings, POC, scope of PT, HEP, and what to expect next visit. Person educated: Patient Education method: Explanation, Demonstration, and Handouts Education comprehension: verbalized understanding, returned demonstration, verbal cues required, and tactile cues required   HOME EXERCISE PROGRAM: Access Code: [email or web address] URL: [email or web address] Date: 08/26/2023 Prepared by: [facility]  Exercises - Supine Transversus Abdominis Bracing - Hands on Stomach  - 2 x daily - 7 x weekly - 1 sets - 10 reps - 5 sec hold - Hooklying Single Knee to Chest Stretch  - 2 x daily - 7 x weekly - 1 sets - 5 reps - 10 sec hold - Supine Lower Trunk Rotation  - 2 x daily - 7 x weekly - 1 sets - 10  reps  09/02/23:  Decompression  09/07/23: - Supine Hamstring Stretch  - 2 x daily - 7 x weekly - 1 sets - 3 reps - 30 hold - Supine Figure 4 Piriformis Stretch  - 2 x daily - 7 x weekly - 1 sets - 3 reps - 30 hold - Seated Figure 4 Piriformis Stretch  - 2 x daily - 7 x weekly - 1 sets - 3 reps - 30 hold  ASSESSMENT:  CLINICAL IMPRESSION: 09/07/23:  Began session with nustep to address soreness.  Session focus with core, proximal strengthening and lumbar mobility.  Added decompression with theraband resistance and hamstring/piriformis stretches with positive reports following and added to HEP.  Pt tolerated well to session with reports of pain reduced to 4/10 at EOS.    Eval:  Patient is a 73 y.o. female who was seen today for physical therapy evaluation and treatment for M47.816 (ICD-10-CM) - Spondylosis without myelopathy or radiculopathy, lumbar region M48.061 (ICD-10-CM) - Spinal stenosis, lumbar region without neurogenic claudication M51.360 (ICD-10-CM) - Degeneration of intervertebral disc of lumbar region with discogenic back pain G89.4 (ICD-10-CM) - Chronic pain syndrome. Patient demonstrates muscle weakness, reduced ROM, and fascial restrictions which are likely contributing to  symptoms of pain and are negatively impacting patient ability to perform ADLs and functional mobility tasks. Patient will benefit from skilled physical therapy services to address these deficits to reduce pain and improve level of function with ADLs and functional mobility tasks.   OBJECTIVE IMPAIRMENTS: Abnormal gait, decreased balance, difficulty walking, decreased ROM, decreased strength, impaired perceived functional ability, and pain.   ACTIVITY LIMITATIONS: carrying, lifting, bending, sitting, standing, squatting, sleeping, transfers, bed mobility, and caring for others  PARTICIPATION LIMITATIONS: meal prep, cleaning, laundry, shopping, community activity, and yard work  [facility] POTENTIAL: Good  CLINICAL DECISION MAKING: Evolving/moderate complexity  EVALUATION COMPLEXITY: Moderate   GOALS: Goals reviewed with patient? No  SHORT TERM GOALS: Target date: 09/09/2023  patient will be independent with initial HEP  Baseline: Goal status: INITIAL  2.  Patient will report 30% improvement overall  Baseline:  Goal status: INITIAL   LONG TERM GOALS: Target date: 09/23/2023  Patient will be independent in self management strategies to improve quality of life and functional outcomes.  Baseline:  Goal status: INITIAL  2.  Patient will report 50% improvement overall  Baseline:  Goal status: INITIAL  3.  Patient will able to stand SLS on left leg x 10 to demonstrate improved functional balance  Baseline: see above Goal status: INITIAL  4.  Patient will improve Modified Oswestry score by 6  points to demonstrate improved perceived function  Baseline: 24/50 Goal status: INITIAL  5.   Patient will increase bilateral leg MMT's to 4+ to 5/5 to allow navigation of steps without gait deviation or loss of balance  Baseline:  Goal status: INITIAL  PLAN:  PT FREQUENCY: 2x/week  PT DURATION: 4 weeks  PLANNED INTERVENTIONS: .02835- PT Re-evaluation, 97110-Therapeutic  exercises, 97530- Therapeutic activity, 97112- Neuromuscular re-education, 97535- Self Care, 02859- Manual therapy, [id number]- Gait training, [id number]- Orthotic Fit/training, [phone]- Canalith repositioning, [id number]- Aquatic Therapy, 97760- Splinting, [id number]- Wound care (first 20 sq cm), 97598- Wound care (each additional 20 sq cm)Patient/Family education, Balance training, Stair training, Taping, Dry Needling, Joint mobilization, Joint manipulation, Spinal manipulation, Spinal mobilization, Scar mobilization, and DME instructions.   PLAN FOR NEXT SESSION: lumbar mobility; Review decompression exercises; postural strengthening.    Augustin Mclean, LPTA/CLT; CBIS [phone]  4:11 PM, 09/08/23

## 2023-09-12 ENCOUNTER — Encounter (HOSPITAL_COMMUNITY)

## 2023-09-12 ENCOUNTER — Encounter (HOSPITAL_COMMUNITY): Payer: Self-pay

## 2023-09-12 DIAGNOSIS — J019 Acute sinusitis, unspecified: Secondary | ICD-10-CM | POA: Diagnosis not present

## 2023-09-12 DIAGNOSIS — B9689 Other specified bacterial agents as the cause of diseases classified elsewhere: Secondary | ICD-10-CM | POA: Diagnosis not present

## 2023-09-14 ENCOUNTER — Encounter (HOSPITAL_COMMUNITY)

## 2023-09-15 ENCOUNTER — Other Ambulatory Visit

## 2023-09-21 ENCOUNTER — Encounter (HOSPITAL_COMMUNITY)

## 2023-09-30 ENCOUNTER — Ambulatory Visit (HOSPITAL_COMMUNITY): Attending: Orthopedic Surgery

## 2023-09-30 DIAGNOSIS — R262 Difficulty in walking, not elsewhere classified: Secondary | ICD-10-CM | POA: Diagnosis not present

## 2023-09-30 DIAGNOSIS — M5459 Other low back pain: Secondary | ICD-10-CM | POA: Diagnosis not present

## 2023-09-30 NOTE — Therapy (Signed)
 OUTPATIENT PHYSICAL THERAPY THORACOLUMBAR TREATMENT/PROGRESS NOTE Progress Note Reporting Period 08/26/23 to 09/30/23  See note below for Objective Data and Assessment of Progress/Goals.  PHYSICAL THERAPY DISCHARGE SUMMARY  Visits from Start of Care: 4  Current functional level related to goals / functional outcomes: See below   Remaining deficits: See below   Education / Equipment: HEP   Patient agrees to discharge. Patient goals were partially met. Patient is being discharged due to being pleased with the current functional level.       Patient Name: Melissa Weaver MRN: [id number] DOB:05-04-1950, 73 y.o., female Today's Date: 09/30/2023  END OF SESSION:  PT End of Session - 09/30/23 1436     Visit Number 4    Number of Visits 8    Date for PT Re-Evaluation 09/23/23    Authorization Type HTA    Authorization Time Period no auth needed    PT Start Time 1435    PT Stop Time 1500    PT Time Calculation (min) 25 min    Activity Tolerance Patient tolerated treatment well;No increased pain    Behavior During Therapy WFL for tasks assessed/performed          Past Medical History:  Diagnosis Date   Anxiety    Arthritis    Asthma    Complication of anesthesia    COPD (chronic obstructive pulmonary disease) (HCC)    Depression    Family history of adverse reaction to anesthesia    son had postoperative N&V   Family history of colon cancer    Family history of melanoma    Family history of pancreatic cancer    GERD (gastroesophageal reflux disease)    HLD (hyperlipidemia)    HTN (hypertension)    Melanoma (HCC)    Personal history of radiation therapy    2019 left breast   PONV (postoperative nausea and vomiting)    Seizures (HCC)    had a seizure as child, unknown etiology, no meds though. No seizures since 10 years.   Past Surgical History:  Procedure Laterality Date   BREAST LUMPECTOMY Left 05/11/2017   intermediate grade DCIS, margins negative    COLONOSCOPY N/A 08/16/2023   Procedure: COLONOSCOPY;  Surgeon: Cindie Carlin POUR, DO;  Location: [facility];  Service: Endoscopy;  Laterality: N/A;  10:15 am, asa 2   ESOPHAGEAL DILATION N/A 08/16/2023   Procedure: DILATION, ESOPHAGUS;  Surgeon: Cindie Carlin POUR, DO;  Location: [facility];  Service: Endoscopy;  Laterality: N/A;   ESOPHAGOGASTRODUODENOSCOPY N/A 08/16/2023   Procedure: EGD (ESOPHAGOGASTRODUODENOSCOPY);  Surgeon: Cindie Carlin POUR, DO;  Location: [facility];  Service: Endoscopy;  Laterality: N/A;   PAROTIDECTOMY Right 11/13/2021   Procedure: SUPERFICIAL PAROTIDECTOMY WITH FACIAL NERVE DISSECTION;  Surgeon: Jesus Oliphant, MD;  Location[facility] OR;  Service: ENT;  Laterality: Right;   PARTIAL MASTECTOMY WITH NEEDLE LOCALIZATION Left 05/11/2017   Procedure: PARTIAL MASTECTOMY WITH NEEDLE LOCALIZATION;  Surgeon: Kallie Manuelita BROCKS, MD;  Location: [facility];  Service: General;  Laterality: Left;   TUBAL LIGATION     Patient Active Problem List   Diagnosis Date Noted   PAD (peripheral artery disease) (HCC) 08/30/2023   Leg swelling 08/30/2023   Nicotine  abuse 08/30/2023   GERD (gastroesophageal reflux disease) 06/15/2023   Dysphagia 06/15/2023   Lower abdominal pain 06/15/2023   OP (osteoporosis) 12/23/2021   Parotid mass 11/13/2021   Genetic testing 09/06/2017   Family history of pancreatic cancer    Family history of colon cancer  Family history of melanoma    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   HLD (hyperlipidemia) 04/07/2017   Anxiety state 04/07/2017   Depression 04/07/2017   HTN (hypertension) 04/07/2017    PCP: Suanne Pfeiffer, NP  REFERRING PROVIDER: Mylinda Forest, Hart HERO, NP  REFERRING DIAG: [phone] (ICD-10-CM) - Spondylosis without myelopathy or radiculopathy, lumbar region M48.061 (ICD-10-CM) - Spinal stenosis, lumbar region without neurogenic claudication M51.360 (ICD-10-CM) - Degeneration of intervertebral disc of lumbar region with discogenic  back pain G89.4 (ICD-10-CM) - Chronic pain syndrome  Rationale for Evaluation and Treatment: Rehabilitation  THERAPY DIAG:  Other low back pain  Difficulty in walking, not elsewhere classified  ONSET DATE: chronic but worse since Jan 2025  SUBJECTIVE:                                                                                                                                                                                           SUBJECTIVE STATEMENT: Maybe 60% better; still pain with mopping, sweeping; can walk farther without pain; had to cancel several appointments due to sickness   Eval:  Chronic pain but worse since Jan 2025.  Had a fall the last week in Jan.  Seems to have irritated everything. Saw PCP first and had a scan and then went to [employer] [facility]. PCP sent to back doctor and had MRI and then had an injection.  Injection helped a little.  Dr. Lear office then referred to therapy  PERTINENT HISTORY:  Bit by a tick recently and had to take meds States her balance is not good at baseline  PAIN:  Are you having pain? Yes: NPRS scale: 6/10 Pain location: left hip Pain description: aches Aggravating factors: walking  Relieving factors: pain medication  PRECAUTIONS: None  RED FLAGS: None   WEIGHT BEARING RESTRICTIONS: No  FALLS:  Has patient fallen in last 6 months? No   OCCUPATION: retired  PLOF: Independent  PATIENT GOALS: less pain  NEXT MD VISIT: 09/15/23  OBJECTIVE:  Note: Objective measures were completed at Evaluation unless otherwise noted.  DIAGNOSTIC FINDINGS:  CLINICAL DATA:  Low back pain   EXAM: MRI LUMBAR SPINE WITHOUT CONTRAST   TECHNIQUE: Multiplanar, multisequence MR imaging of the lumbar spine was performed. No intravenous contrast was administered.   COMPARISON:  CT 04/15/2023   FINDINGS: Segmentation:  5 lumbar type vertebral bodies.   Alignment: No significant malalignment. 1-2 mm degenerative anterolisthesis  L4-5.   Vertebrae: No fracture or focal bone lesion. No edematous endplate marrow changes or edematous facet arthritis.   Conus medullaris and cauda equina: Conus extends to the L1 level. Conus and cauda equina appear  normal.   Paraspinal and other soft tissues: Negative   Disc levels:   No significant finding from T11-12 through L2-3: Minimal disc bulges but no compressive narrowing of the canal or foramina.   L3-4: Shallow protrusion of the disc. Facet and ligamentous hypertrophy. Multifactorial stenosis particularly affecting the lateral recesses. Definite neural compression is not established, but the lateral recess stenosis could potentially be symptomatic.   L4-5: Disc degeneration with shallow protrusion, more towards the right, with a foraminal to extraforaminal disc osteophyte complex. Facet degeneration and hypertrophy with 1-2 mm of anterolisthesis. No compressive central canal stenosis. Narrowing of both subarticular lateral recesses. Foraminal to extraforaminal encroachment on the right that could affect the right L4 nerve. The facet arthritis could be a cause of back pain or referred facet syndrome pain.   L5-S1: Endplate osteophytes and small left posterolateral to foraminal disc herniation. No central canal stenosis. The L5 nerve appears to exit without gross compression, but left L5 nerve irritation could possibly occur.   IMPRESSION: 1. L3-4: Shallow protrusion of the disc. Facet and ligamentous hypertrophy. Multifactorial stenosis particularly affecting the lateral recesses. Definite neural compression is not established, but the lateral recess stenosis could possibly be symptomatic. 2. L4-5: Shallow protrusion of the disc more towards the right, with a right foraminal to extraforaminal disc osteophyte complex. Facet degeneration and hypertrophy with 1-2 mm of anterolisthesis. Narrowing of both subarticular lateral recesses. Foraminal to extraforaminal  encroachment on the right by prominent disc osteophyte complex that could affect the right L4 nerve. The facet arthritis could be a cause of back pain or referred facet syndrome pain. 3. L5-S1: Endplate osteophytes and small left posterolateral to foraminal disc herniation. No central canal stenosis. The L5 nerve appears to exit without gross compression, but left L5 nerve irritation could possibly occur.     Electronically Signed   By: Oneil Officer M.D.   On: 06/25/2023 11:42  PATIENT SURVEYS:  Modified Oswestry:  MODIFIED OSWESTRY DISABILITY SCALE  Date: 08/26/23 Score                                Total 24/50; 48%   Interpretation of scores: Score Category Description  0-20% Minimal Disability The patient can cope with most living activities. Usually no treatment is indicated apart from advice on lifting, sitting and exercise  21-40% Moderate Disability The patient experiences more pain and difficulty with sitting, lifting and standing. Travel and social life are more difficult and they may be disabled from work. Personal care, sexual activity and sleeping are not grossly affected, and the patient can usually be managed by conservative means  41-60% Severe Disability Pain remains the main problem in this group, but activities of daily living are affected. These patients require a detailed investigation  61-80% Crippled Back pain impinges on all aspects of the patient's life. Positive intervention is required  81-100% Bed-bound  These patients are either bed-bound or exaggerating their symptoms  Bluford FORBES Zoe DELENA Karon DELENA, et al. Surgery versus conservative management of stable thoracolumbar fracture: the PRESTO feasibility RCT. [place]): [employer]; 2021 Nov. [facility] [employer] Technology Assessment, No. 25.62.) Appendix 3, Oswestry Disability Index category descriptors. Available from: FindJewel[employer].cz  Minimally Clinically  Important Difference (MCID) = 12.8%  COGNITION: Overall cognitive status: Within functional limits for tasks assessed     SENSATION: WFL  MUSCLE LENGTH: Slightly tighter right > left  POSTURE: rounded shoulders and forward head  PALPATION: Tender low back paraspinals; quadratus lumbarom  LUMBAR ROM: * pain  AROM eval 09/30/23  Flexion Fingertips to mid shin* Fingertips to ankles; top of feet  Extension 20% available* 70%  available  Right lateral flexion To 1 above knee joint line   Left lateral flexion To 1 above knee joint line   Right rotation    Left rotation     (Blank rows = not tested)  LOWER EXTREMITY ROM:     Active  Right eval Left eval  Hip flexion    Hip extension    Hip abduction    Hip adduction    Hip internal rotation    Hip external rotation    Knee flexion    Knee extension    Ankle dorsiflexion    Ankle plantarflexion    Ankle inversion    Ankle eversion     (Blank rows = not tested)  LOWER EXTREMITY MMT:    MMT Right eval Left eval Right 09/30/23 Left 09/30/23  Hip flexion 4 4+ 5 5  Hip extension 4- 4-    Hip abduction      Hip adduction      Hip internal rotation      Hip external rotation      Knee flexion 4 4    Knee extension 4+ 5 5 5   Ankle dorsiflexion 5 5 5 5   Ankle plantarflexion      Ankle inversion      Ankle eversion       (Blank rows = not tested)   FUNCTIONAL TESTS:  5 times sit to stand: 27.01 hands on thighs SLS 1 on left; unable on right  GAIT: Distance walked: 60 ft in clinic Assistive device utilized: None Level of assistance: Modified independence Comments: slight decreased gait speed  TREATMENT DATE: 09/30/23 Progress note 5 times sit to stand 14.06 sec using hands to assist up SLS left foot 12; right foot 9 MMT's and AROM see above Modified Oswestry 25/50;  50%   09/07/23:  Nustep UE/LE [place] x 5' SPM 65 Supine:  SKTC 3x 20  Glut sets 15x 5  Piriformis stretch 90/90 2x  30  Hamstring stretch 2x 30 Decompression with RTB 5x each direction (4) Sit to stand with no HHA 10x eccentric control    09/02/23:  Reviewed goals  Reviewed importance of HEP compliance that was reviewed this session  Supine:  Log rolling  Diaphragmatic breathing with 1 hand on stomach and 1 on chest x 3 min  TrA activation paired with breathing out x 5 min  SKTC 5x 5  LTR 5x 10  Posterior pelvic tilt with verbal and tactile cueing 5x 5  Decompression 2-5 5x 5   08/26/23    physical therapy evaluation and HEP instruction  PATIENT EDUCATION:  Education details: Patient educated on exam findings, POC, scope of PT, HEP, and what to expect next visit. Person educated: Patient Education method: Explanation, Demonstration, and Handouts Education comprehension: verbalized understanding, returned demonstration, verbal cues required, and tactile cues required   HOME EXERCISE PROGRAM: Access Code: [email or web address] URL: [email or web address] Date: 08/26/2023 Prepared by: [facility]  Exercises - Supine Transversus Abdominis Bracing - Hands on Stomach  - 2 x daily - 7 x weekly - 1 sets - 10 reps - 5 sec hold - Hooklying Single Knee to Chest Stretch  - 2 x daily - 7 x weekly - 1 sets - 5 reps - 10 sec hold - Supine Lower Trunk Rotation  - 2 x daily - 7 x weekly - 1 sets - 10 reps  09/02/23:  Decompression  09/07/23: - Supine Hamstring Stretch  - 2 x daily - 7 x weekly - 1 sets - 3 reps - 30 hold - Supine Figure 4 Piriformis Stretch  - 2 x daily - 7 x weekly - 1 sets - 3 reps - 30 hold - Seated Figure 4 Piriformis Stretch  - 2 x daily - 7 x weekly - 1 sets - 3 reps - 30 hold  ASSESSMENT:  CLINICAL IMPRESSION: Progress note; patient has met 5/7 set rehab goals and is agreeable to discharge at this time.  Eval:  Patient is a 73 y.o. female who was  seen today for physical therapy evaluation and treatment for M47.816 (ICD-10-CM) - Spondylosis without myelopathy or radiculopathy, lumbar region M48.061 (ICD-10-CM) - Spinal stenosis, lumbar region without neurogenic claudication M51.360 (ICD-10-CM) - Degeneration of intervertebral disc of lumbar region with discogenic back pain G89.4 (ICD-10-CM) - Chronic pain syndrome. Patient demonstrates muscle weakness, reduced ROM, and fascial restrictions which are likely contributing to symptoms of pain and are negatively impacting patient ability to perform ADLs and functional mobility tasks. Patient will benefit from skilled physical therapy services to address these deficits to reduce pain and improve level of function with ADLs and functional mobility tasks.   OBJECTIVE IMPAIRMENTS: Abnormal gait, decreased balance, difficulty walking, decreased ROM, decreased strength, impaired perceived functional ability, and pain.   ACTIVITY LIMITATIONS: carrying, lifting, bending, sitting, standing, squatting, sleeping, transfers, bed mobility, and caring for others  PARTICIPATION LIMITATIONS: meal prep, cleaning, laundry, shopping, community activity, and yard work  [facility] POTENTIAL: Good  CLINICAL DECISION MAKING: Evolving/moderate complexity  EVALUATION COMPLEXITY: Moderate   GOALS: Goals reviewed with patient? No  SHORT TERM GOALS: Target date: 09/09/2023  patient will be independent with initial HEP  Baseline: Goal status: Met  2.  Patient will report 30% improvement overall  Baseline:  Goal status: met   LONG TERM GOALS: Target date: 09/23/2023  Patient will be independent in self management strategies to improve quality of life and functional outcomes.  Baseline:  Goal status: met  2.  Patient will report 50% improvement overall  Baseline:  Goal status: MET  3.  Patient will able to stand SLS on left leg x 10 to demonstrate improved functional balance  Baseline: see above; 9 on  right and 12 on left 09/30/23 Goal status: INITIAL  4.  Patient will improve Modified Oswestry score by 6  points to demonstrate improved perceived function  Baseline: 24/50 Goal status: INITIAL  5.   Patient will increase bilateral leg MMT's to 4+ to 5/5 to allow navigation of steps without gait deviation or loss of balance  Baseline:  Goal status:met  PLAN:  PT FREQUENCY:  2x/week  PT DURATION: 4 weeks  PLANNED INTERVENTIONS: .02835- PT Re-evaluation, 97110-Therapeutic exercises, 97530- Therapeutic activity, [id number]- Neuromuscular re-education, 97535- Self Care, [id number]- Manual therapy, [id number]- Gait training, [phone]- Orthotic Fit/training, [phone]- Canalith repositioning, [id number]- Aquatic Therapy, [phone]- Splinting, [phone]- Wound care (first 20 sq cm), 97598- Wound care (each additional 20 sq cm)Patient/Family education, Balance training, Stair training, Taping, Dry Needling, Joint mobilization, Joint manipulation, Spinal manipulation, Spinal mobilization, Scar mobilization, and DME instructions.   PLAN FOR NEXT SESSION: discharge 3:05 PM, 09/30/23 Neco Kling Small Luba Matzen MPT [facility] [place] [phone]

## 2023-10-03 ENCOUNTER — Other Ambulatory Visit

## 2023-10-03 ENCOUNTER — Encounter (HOSPITAL_COMMUNITY)

## 2023-10-04 ENCOUNTER — Ambulatory Visit: Attending: Internal Medicine

## 2023-10-04 DIAGNOSIS — R600 Localized edema: Secondary | ICD-10-CM

## 2023-10-04 DIAGNOSIS — M7989 Other specified soft tissue disorders: Secondary | ICD-10-CM

## 2023-10-04 LAB — ECHOCARDIOGRAM COMPLETE
AR max vel: 2.42 cm2
AV Peak grad: 8.8 mmHg
Ao pk vel: 1.48 m/s
Area-P 1/2: 2.83 cm2
Calc EF: 69.6 %
S' Lateral: 2 cm
Single Plane A2C EF: 69.7 %
Single Plane A4C EF: 67.5 %

## 2023-10-06 ENCOUNTER — Encounter (HOSPITAL_COMMUNITY)

## 2023-10-10 ENCOUNTER — Encounter (HOSPITAL_COMMUNITY)

## 2023-10-11 ENCOUNTER — Encounter: Payer: Self-pay | Admitting: Gastroenterology

## 2023-10-11 ENCOUNTER — Ambulatory Visit (INDEPENDENT_AMBULATORY_CARE_PROVIDER_SITE_OTHER): Admitting: Gastroenterology

## 2023-10-11 VITALS — BP 109/74 | HR 98 | Temp 98.6°F | Ht 62.0 in | Wt 203.0 lb

## 2023-10-11 DIAGNOSIS — K760 Fatty (change of) liver, not elsewhere classified: Secondary | ICD-10-CM | POA: Insufficient documentation

## 2023-10-11 DIAGNOSIS — K2270 Barrett's esophagus without dysplasia: Secondary | ICD-10-CM | POA: Diagnosis not present

## 2023-10-11 DIAGNOSIS — K862 Cyst of pancreas: Secondary | ICD-10-CM | POA: Insufficient documentation

## 2023-10-11 DIAGNOSIS — Z800 Family history of malignant neoplasm of digestive organs: Secondary | ICD-10-CM | POA: Diagnosis not present

## 2023-10-11 DIAGNOSIS — K2100 Gastro-esophageal reflux disease with esophagitis, without bleeding: Secondary | ICD-10-CM

## 2023-10-11 DIAGNOSIS — K219 Gastro-esophageal reflux disease without esophagitis: Secondary | ICD-10-CM

## 2023-10-11 NOTE — Patient Instructions (Signed)
 You do have a small hiatal hernia noted on endoscopy. This typical increases reflux. Continue pantoprazole once daily at bedtime as discussed.   You have Barrett's esophagus. You should stay on pantoprazole indefinitely. You will need another upper endoscopy in five year or sooner if your are having issues swallowing or controlling reflux symptoms.   Your next colonoscopy will be in 7 years due to precancerous polyp.  We will monitor the pancreatic lesion seen on MRI recently. I am actually going to advise next MRI in one year instead of 2 years this time due to your family history of pancreatic cancer.   For fatty liver: Instructions for fatty liver: Recommend 1-2# weight loss per week until ideal body weight through exercise & diet. Low fat/cholesterol diet.   Avoid sweets, sodas, fruit juices, sweetened beverages like tea, etc. Gradually increase exercise from 15 min daily up to 1 hr per day 5 days/week. Limit alcohol  use.  We will continue to follow your liver labs and level of fibrosis. If you approach a level 3, you would be offered medication for fatty liver.   Return to the office in one year or sooner if needed.

## 2023-10-11 NOTE — Progress Notes (Signed)
 GI Office Note    Referring Provider: Suanne Pfeiffer, NP Primary Care Physician:  Suanne Pfeiffer, NP  Primary Gastroenterologist: [name], DO   Chief Complaint   Chief Complaint  Patient presents with   Follow-up    Follow up on abd pain and colonoscopy and EGD. Pt states she is doing good    History of Present Illness   Melissa Weaver is a 73 y.o. female presenting today for follow-up.  She was last seen in May.  She was having abdominal pain at that time including epigastric region and lower abdominal pain.  Symptoms worse with standing, potentially related to pelvic floor prolapse.  Significant family history of pancreatic cancer, colon cancer and she was scheduled for colonoscopy as well as a CT scan of her abdomen and pelvis.  She had GERD/dysphagia   She completed CT A/P with IV contrast and oral contrast 07/01/23 and was found to have left adrenal nodule 2.3cm and hepatic steatosis. PCP worked up adrenal nodule with labs and MRI. All these studies were done at [facility] (see Care Everywhere). MRI for adrenal lesion, showed benign adenoma. She was noted to have an incidental 9 mm pancreatic cystic lesion with no pancreatic ductal dilation. She prefers us  to handle surveillance, would consider one year surveillance given her FH of pancreatic cancer. She also had severe steatosis.   Discussed the use of AI scribe software for clinical note transcription with the patient, who gave verbal consent to proceed.   She has experienced significant improvement in her previous abdominal pain after receiving a back injection and undergoing physical therapy. She did not require a second injection as her pain was well-controlled with pain medication and Tylenol . She believes her abdominal pain was referred from her back.   She is currently taking pantoprazole 40 mg at night for acid reflux. Switching to night time dosing seems to be more efficacious for her.  She has a history of  a hiatal hernia. No dysphagia. Her bowel movements are regular, and she has no current abdominal pain. No melena, brbpr. She has a history of Barrett's esophagus, identified during an endoscopy.    Wt Readings from Last 3 Encounters:  10/11/23 203 lb (92.1 kg)  08/30/23 198 lb 12.8 oz (90.2 kg)  08/16/23 202 lb (91.6 kg)   Prior Data   Egd 07/2023: -Small hiatal hernia - Z-line variable status post biopsy, Barrett's mucosa, no dysplasia - Gastritis status post biopsy, no H. pylori - Normal duodenal bulb, first portion of duodenum and second portion of duodenum -Consider EGD in 5 years - PPI daily  Colonoscopy 07/2023: - Hemorrhoids found on perianal exam - Nonbleeding internal hemorrhoids - Diverticulosis -two 4 to 8 mm polyps in the rectum and sigmoid colon, single tubular adenoma, hyperplastic polyp - Repeat colonoscopy in 7 years   Labs May 2025: Blood cell count 8700, hemoglobin 13.2, MCV 87, platelets 360,000, glucose 95, creatinine 0.91, sodium 141, potassium 4.9, total bilirubin less than 0.2, alk phos 97, AST 25, ALT 34 high.   Labs from 08/22/2023: TSH 2.52, total cholesterol 150, LDL 45, A1c 6.4.    CT abdomen pelvis with IV contrast and oral contrast in June 2025 at [facility] IMPRESSION:  Left adrenal nodule measuring 2.3 cm. Consider biochemical assays to determine functional status and exclude pheochromocytoma. Recommend further evaluation with adrenal protocol CT, although chemical-shift MRI may be considered.  Hepatic steatosis.  Otherwise unremarkable CT of the abdomen and pelvis.  MRI Abd without contrast 06/2023: IMPRESSION:  1. Left adrenal nodule is compatible with a benign adrenal adenoma.  2.  Severe heterogeneous hepatic steatosis.  3.  Incidental subcentimeter cystic lesion in pancreas. Please reference follow-up guidelines below:   In patients 21-83 years old at discovery, with pancreatic cystic lesion(s) less than 1.5 cm demonstrating no suspicious  imaging features:  - Surveillance MRI at 2 year intervals for 10 years.   Any pancreatic cystic lesion with suspicious imaging characteristics (mural nodule, wall thickening, dilation of the main pancreatic duct greater than 7 mm, peripheral calcifications) should prompt immediate endoscopic ultrasound/FNA and surgical evaluation.   Reference:  Megibow AJ, et al. Management of Incidental Pancreatic Cysts: White Paper of the ACR Incidental Findings Committee. JACR 2017 14(7):911-923.     Medications   Current Outpatient Medications  Medication Sig Dispense Refill   acetaminophen  (TYLENOL ) 650 MG CR tablet Take 1,300 mg by mouth daily as needed for pain.     albuterol (VENTOLIN HFA) 108 (90 Base) MCG/ACT inhaler Inhale 2 puffs into the lungs every 4 (four) hours as needed.     ALPRAZolam  (XANAX ) 0.25 MG tablet Take 0.25 mg by mouth See admin instructions. Take 1 tablet (0.25 mg) by mouth scheduled every morning & may take an additional 2 doses as needed for anxiety.     aspirin  EC 81 MG tablet Take 1 tablet (81 mg total) by mouth daily. Swallow whole.     BREZTRI AEROSPHERE 160-9-4.8 MCG/ACT AERO inhaler 2 puffs.     Cholecalciferol  125 MCG (5000 UT) capsule Take 5,000 Units by mouth daily.     denosumab  (PROLIA ) 60 MG/ML SOLN injection Inject 60 mg into the skin every 6 (six) months. Administer in upper arm, thigh, or abdomen     escitalopram  (LEXAPRO ) 10 MG tablet Take 10 mg by mouth in the morning.     furosemide (LASIX) 20 MG tablet Take 1 tablet by mouth daily as needed.     GEMTESA 75 MG TABS Take 1 tablet by mouth daily.     inclisiran (LEQVIO ) 284 MG/1.5ML SOSY injection Inject 284 mg into the skin.     Magnesium  250 MG TABS Take 250 mg by mouth every evening.     pantoprazole (PROTONIX) 40 MG tablet Take 40 mg by mouth at bedtime.     Powders (ANTI MONKEY BUTT) POWD Apply 1 Application topically daily as needed (irritation (skin folds--breast area)).     REPATHA SURECLICK 140 MG/ML  SOAJ      traMADol (ULTRAM) 50 MG tablet Take 1 tablet by mouth 3 (three) times daily as needed.     No current facility-administered medications for this visit.    Allergies   Allergies as of 10/11/2023   (No Known Allergies)     Review of Systems   General: Negative for anorexia, weight loss, fever, chills, fatigue, weakness. ENT: Negative for hoarseness, difficulty swallowing , nasal congestion. CV: Negative for chest pain, angina, palpitations, dyspnea on exertion, peripheral edema.  Respiratory: Negative for dyspnea at rest, dyspnea on exertion, cough, sputum, wheezing.  GI: See history of present illness. GU:  Negative for dysuria, hematuria, urinary incontinence, urinary frequency, nocturnal urination.  Endo: Negative for unusual weight change.     Physical Exam   BP 109/74   Pulse 98   Temp 98.6 F (37 C)   Ht 5' 2 (1.575 m)   Wt 203 lb (92.1 kg)   BMI 37.13 kg/m    General: Well-nourished, well-developed in no  acute distress.  Eyes: No icterus. Mouth: Oropharyngeal mucosa moist and pink   Lungs: Clear to auscultation bilaterally.  Heart: Regular rate and rhythm, no murmurs rubs or gallops.  Abdomen: Bowel sounds are normal,   nondistended, no hepatosplenomegaly or masses,  no abdominal bruits or hernia , no rebound or guarding. Mild diffuse upper abdominal tenderness Rectal: not performed Extremities: No lower extremity edema. No clubbing or deformities. Neuro: Alert and oriented x 4   Skin: Warm and dry, no jaundice.   Psych: Alert and cooperative, normal mood and affect.  Labs   See above  Imaging Studies   ECHOCARDIOGRAM COMPLETE Result Date: 10/04/2023    ECHOCARDIOGRAM REPORT   Patient Name:   Melissa Weaver Date of Exam: 10/04/2023 Medical Rec #:  [id number]        Height:       62.0 in Accession #:    [id number]       Weight:       198.8 lb Date of Birth:  08-04-1950        BSA:          1.907 m Patient Age:    73 years         BP:            128/72 mmHg Patient Gender: F                HR:           99 bpm. Exam Location:  [facility] Procedure: 2D Echo, Cardiac Doppler, Color Doppler and Strain Analysis (Both            Spectral and Color Flow Doppler were utilized during procedure). Indications:    M79.89 Legs swelling  History:        Patient has no prior history of Echocardiogram examinations. PAD                 and COPD, Signs/Symptoms:Dyspnea and Edema; Risk                 Factors:Hypertension, Dyslipidemia, Current Smoker and Obesity.  Sonographer:    Bascom Burows RCS, RVS Referring Phys: [id number] VISHNU P MALLIPEDDI IMPRESSIONS  1. Left ventricular ejection fraction, by estimation, is 60 to 65%. The left ventricle has normal function. The left ventricle has no regional wall motion abnormalities. Left ventricular diastolic parameters are indeterminate. The average left ventricular global longitudinal strain is -19.0 %. The global longitudinal strain is normal.  2. Right ventricular systolic function is normal. The right ventricular size is normal. There is normal pulmonary artery systolic pressure. The estimated right ventricular systolic pressure is 30.0 mmHg.  3. The mitral valve is degenerative. Trivial mitral valve regurgitation.  4. The aortic valve is tricuspid. There is mild calcification of the aortic valve. Aortic valve regurgitation is not visualized. Aortic valve sclerosis is present, with no evidence of aortic valve stenosis.  5. The inferior vena cava is normal in size with greater than 50% respiratory variability, suggesting right atrial pressure of 3 mmHg. Comparison(s): No prior Echocardiogram. FINDINGS  Left Ventricle: Left ventricular ejection fraction, by estimation, is 60 to 65%. The left ventricle has normal function. The left ventricle has no regional wall motion abnormalities. The average left ventricular global longitudinal strain is -19.0 %. Strain was performed and the global longitudinal strain is normal. The global  longitudinal strain is normal despite suboptimal segment tracking. The left ventricular internal cavity size was normal in size. There is no left ventricular  hypertrophy. Left ventricular diastolic parameters are indeterminate. Right Ventricle: The right ventricular size is normal. No increase in right ventricular wall thickness. Right ventricular systolic function is normal. There is normal pulmonary artery systolic pressure. The tricuspid regurgitant velocity is 2.60 m/s, and  with an assumed right atrial pressure of 3 mmHg, the estimated right ventricular systolic pressure is 30.0 mmHg. Left Atrium: Left atrial size was normal in size. Right Atrium: Right atrial size was normal in size. Pericardium: There is no evidence of pericardial effusion. Mitral Valve: The mitral valve is degenerative in appearance. Mild mitral annular calcification. Trivial mitral valve regurgitation. MV peak gradient, 6.9 mmHg. The mean mitral valve gradient is 4.0 mmHg. Tricuspid Valve: The tricuspid valve is grossly normal. Tricuspid valve regurgitation is trivial. Aortic Valve: The aortic valve is tricuspid. There is mild calcification of the aortic valve. There is mild aortic valve annular calcification. Aortic valve regurgitation is not visualized. Aortic valve sclerosis is present, with no evidence of aortic valve stenosis. Aortic valve peak gradient measures 8.8 mmHg. Pulmonic Valve: The pulmonic valve was grossly normal. Pulmonic valve regurgitation is trivial. Aorta: The aortic root and ascending aorta are structurally normal, with no evidence of dilitation. Venous: The inferior vena cava is normal in size with greater than 50% respiratory variability, suggesting right atrial pressure of 3 mmHg. IAS/Shunts: No atrial level shunt detected by color flow Doppler. Additional Comments: 3D was performed not requiring image post processing on an independent workstation and was indeterminate.  LEFT VENTRICLE PLAX 2D LVIDd:         3.50  cm     Diastology LVIDs:         2.00 cm     LV e' medial:    6.85 cm/s LV PW:         0.80 cm     LV E/e' medial:  14.1 LV IVS:        0.80 cm     LV e' lateral:   6.85 cm/s LVOT diam:     2.00 cm     LV E/e' lateral: 14.1 LVOT Area:     3.14 cm                            2D Longitudinal Strain                            2D Strain GLS (A4C):   -19.1 % LV Volumes (MOD)           2D Strain GLS (A3C):   -19.7 % LV vol d, MOD A2C: 39.3 ml 2D Strain GLS (A2C):   -18.2 % LV vol d, MOD A4C: 39.4 ml 2D Strain GLS Avg:     -19.0 % LV vol s, MOD A2C: 11.9 ml LV vol s, MOD A4C: 12.8 ml LV SV MOD A2C:     27.4 ml LV SV MOD A4C:     39.4 ml LV SV MOD BP:      29.4 ml RIGHT VENTRICLE             IVC RV Basal diam:  2.70 cm     IVC diam: 1.30 cm RV Mid diam:    3.00 cm RV S prime:     13.20 cm/s TAPSE (M-mode): 2.5 cm LEFT ATRIUM             Index  RIGHT ATRIUM          Index LA diam:        3.20 cm 1.68 cm/m   RA Area:     9.87 cm LA Vol (A2C):   35.7 ml 18.72 ml/m  RA Volume:   17.70 ml 9.28 ml/m LA Vol (A4C):   21.5 ml 11.28 ml/m LA Biplane Vol: 29.9 ml 15.68 ml/m  AORTIC VALVE                 PULMONIC VALVE AV Area (Vmax): 2.42 cm     PV Vmax:       1.20 m/s AV Vmax:        148.00 cm/s  PV Peak grad:  5.8 mmHg AV Peak Grad:   8.8 mmHg LVOT Vmax:      114.00 cm/s  AORTA Ao Root diam: 2.70 cm Ao Asc diam:  2.90 cm MITRAL VALVE                TRICUSPID VALVE MV Area (PHT): 2.83 cm     TR Peak grad:   27.0 mmHg MV Peak grad:  6.9 mmHg     TR Vmax:        260.00 cm/s MV Mean grad:  4.0 mmHg MV Vmax:       1.31 m/s     SHUNTS MV Vmean:      96.9 cm/s    Systemic Diam: 2.00 cm MV Decel Time: 268 msec MV E velocity: 96.70 cm/s MV A velocity: 120.00 cm/s MV E/A ratio:  0.81 Jayson Sierras MD Electronically signed by Jayson Sierras MD Signature Date/Time: 10/04/2023/4:52:38 PM    Final     Assessment/Plan:      GERD with Barrett's esophagus Reflux controlled with pantoprazole. Will require surveillance EGD due to  Barrett's. - Continue pantoprazole 40 mg daily, she is taking at night with best results. - EGD in 5 years or sooner if dysphagia or refractory symptoms.  MASLD: Significant steatosis on CT and MRI. Fatty liver disease likely due to metabolic syndrome with risk factors including diabetes, hyperlipidemia and obesity.  -FIB 4 1.02, advanced fibrosis excluded. -recommend tight glycemic control, control of dyslipidemia, physical activity as tolerated, low fat/low carb diet. No alcohol .  -continue to monitor fibrosis scores at least annually.    Pancreatic cystic lesion, under surveillance 9mm pancreatic cystic lesion noted on recent MRI, no pancreatic ductal dilation -FH pancreatic cancer in mother, maternal aunt, and maternal uncle -plan MRI Abd with and without contrast in 06/2024    Sonny RAMAN. Ezzard, MHS, PA-C [facility]

## 2023-10-13 ENCOUNTER — Encounter (HOSPITAL_COMMUNITY)

## 2023-10-14 IMAGING — MG MM DIGITAL SCREENING BILAT W/ TOMO AND CAD
6 of 10 series · 6 of 30 positions shown · non-contrast
Comparison: Previous exam(s).

CLINICAL DATA: Screening. Personal history of malignant LEFT breast
lumpectomy in 8796.

EXAM:
DIGITAL SCREENING BILATERAL MAMMOGRAM WITH TOMOSYNTHESIS AND CAD
TECHNIQUE: Bilateral screening digital craniocaudal and mediolateral oblique
mammograms were obtained. Bilateral screening digital breast
tomosynthesis was performed. The images were evaluated with
computer-aided detection.

[R CC synth-2D]
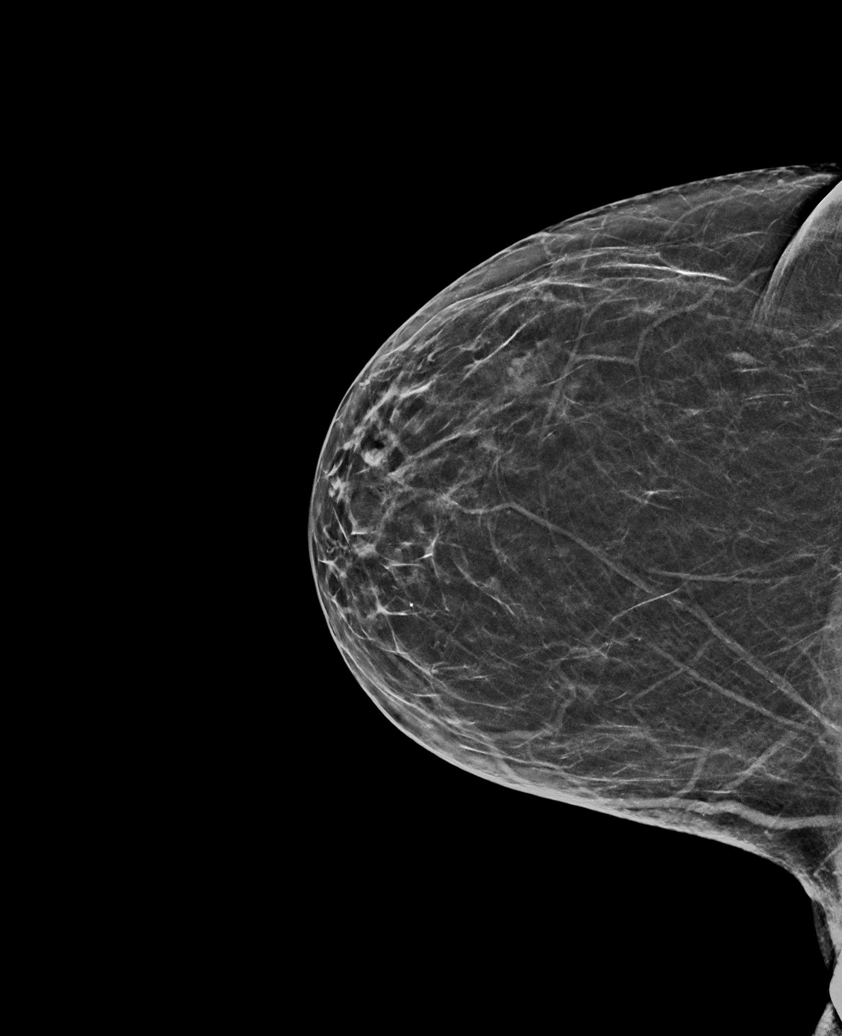

[L CC synth-2D (1 of 2)]
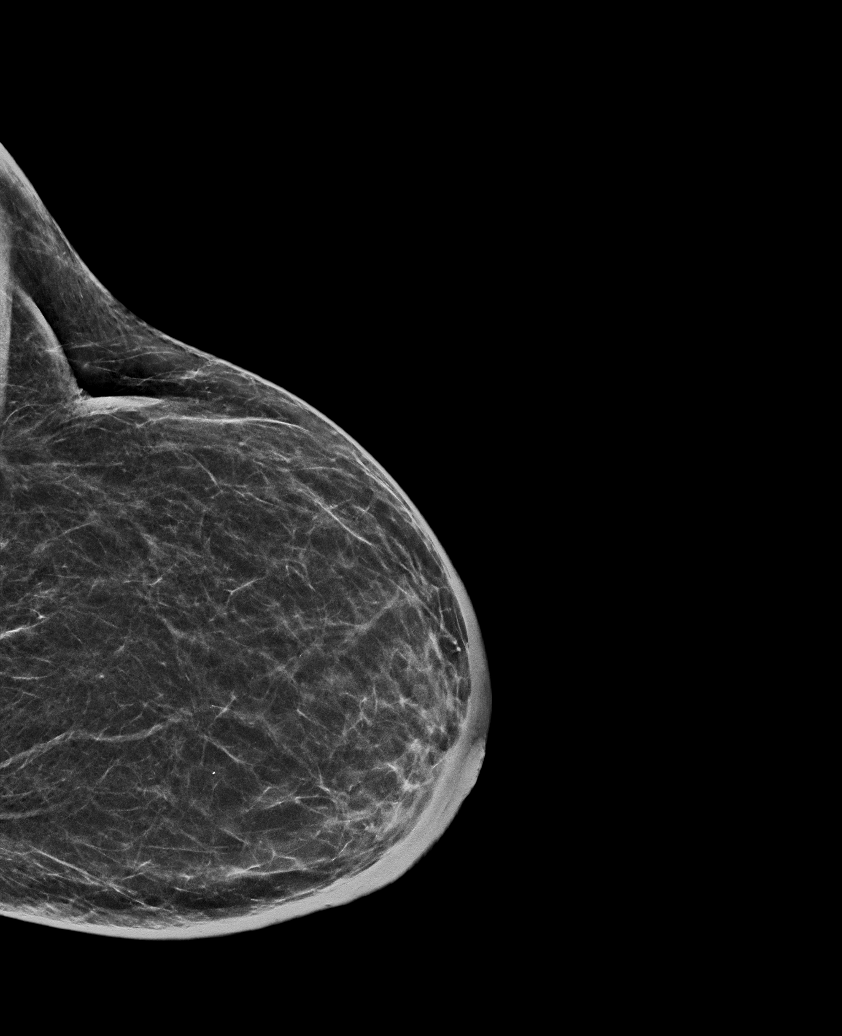

[L MLO synth-2D]
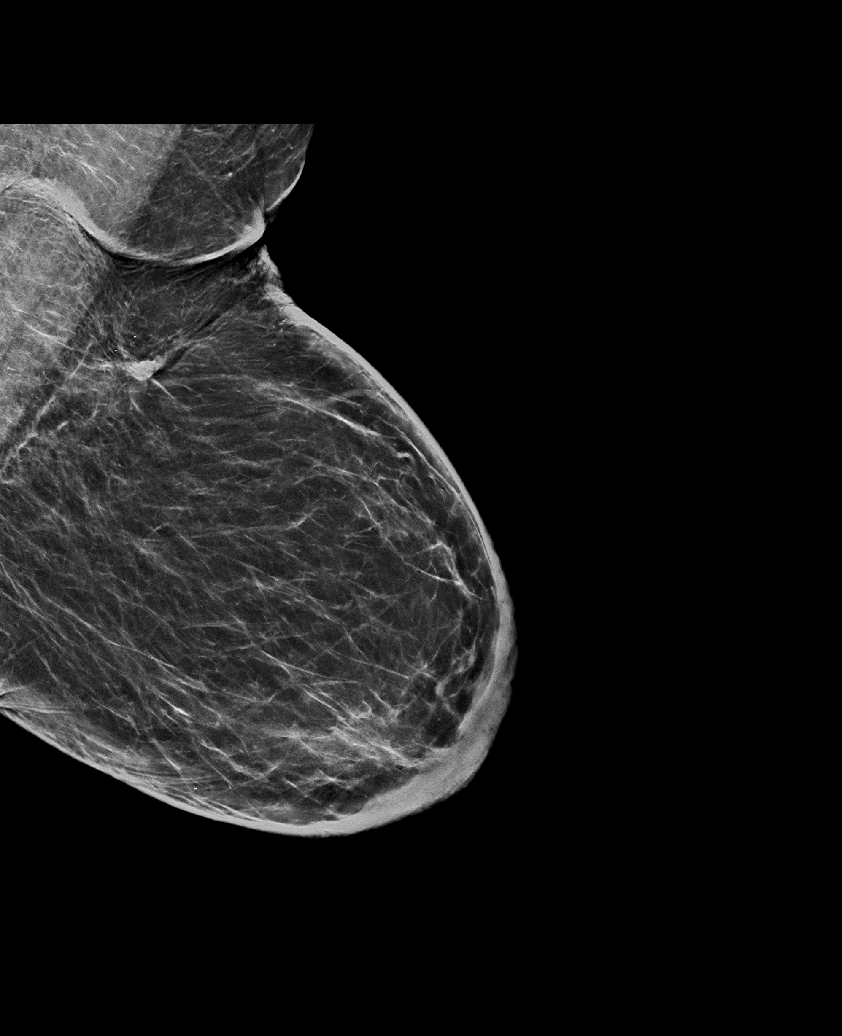

[R MLO synth-2D]
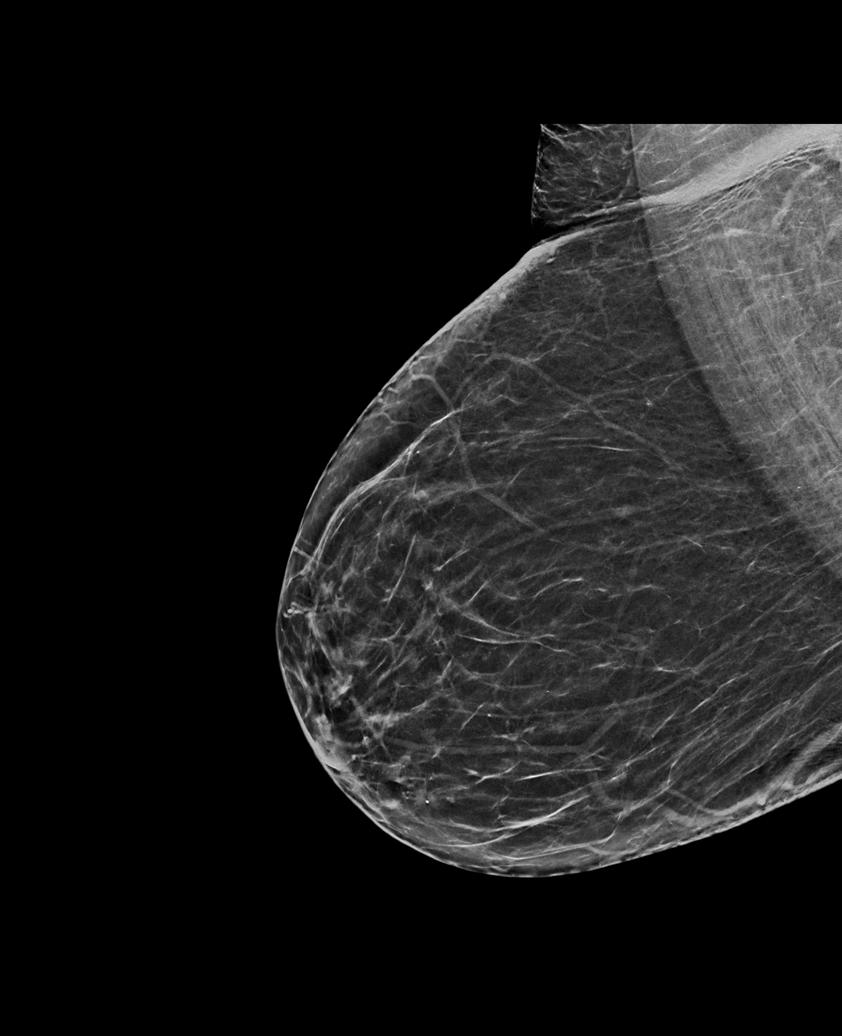

[L CC synth-2D (2 of 2)]
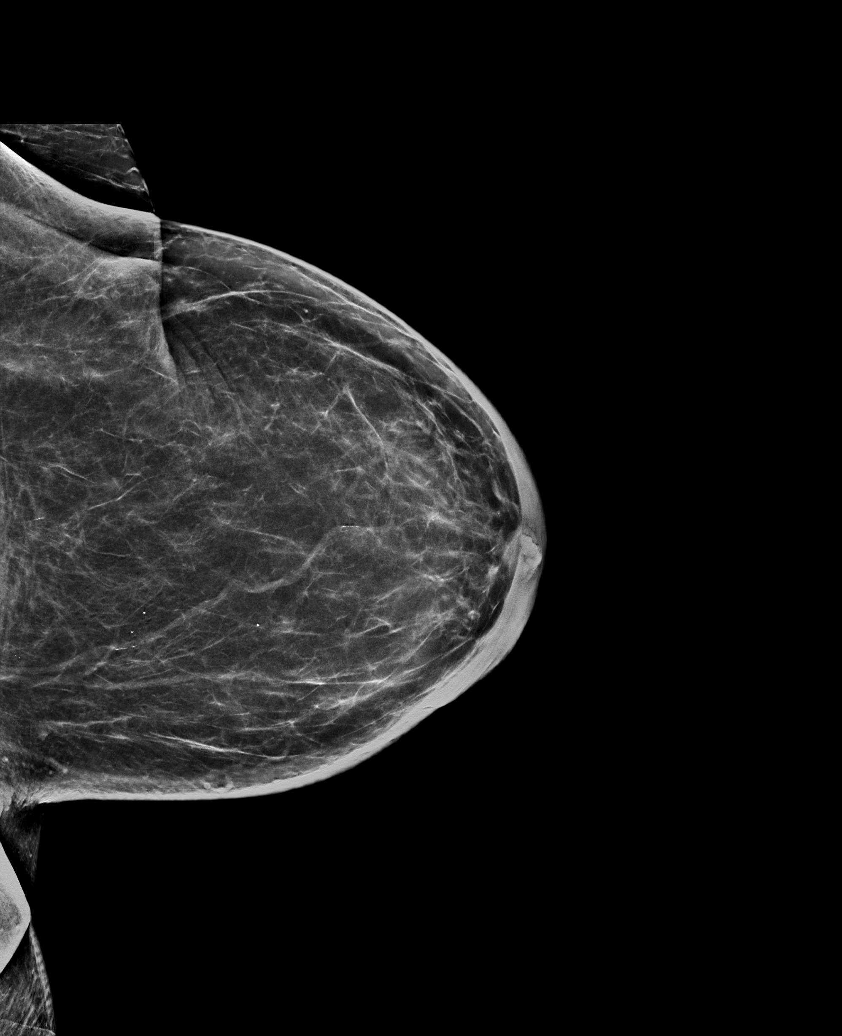

[L CC tomo · tomo slice 35/68.0]
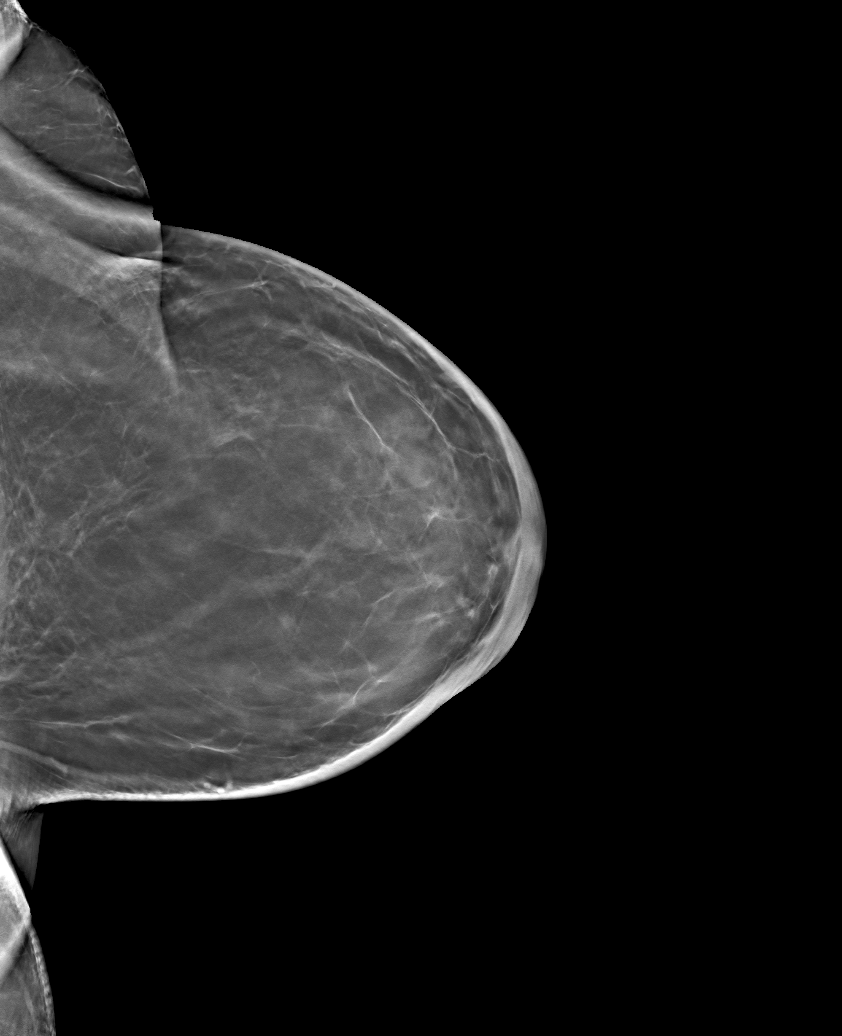

[6 of 30 positions shown; findings below may reference images not displayed]

ACR Breast Density Category b: There are scattered areas of
fibroglandular density.
FINDINGS: There are no findings suspicious for malignancy. Expected post
lumpectomy changes and residual mild post radiation changes
involving the LEFT breast.
IMPRESSION: No mammographic evidence of malignancy. A result letter of this
screening mammogram will be mailed directly to the patient.

RECOMMENDATION:
Screening mammogram in one year. (Code:90-I-1CZ)

BI-RADS CATEGORY  2: Benign.

## 2023-10-17 ENCOUNTER — Encounter (HOSPITAL_COMMUNITY)

## 2023-10-19 ENCOUNTER — Ambulatory Visit: Payer: Self-pay | Admitting: Internal Medicine

## 2023-10-20 ENCOUNTER — Encounter (HOSPITAL_COMMUNITY)

## 2023-10-24 ENCOUNTER — Encounter (HOSPITAL_COMMUNITY)

## 2023-10-27 ENCOUNTER — Encounter (HOSPITAL_COMMUNITY)

## 2023-10-31 ENCOUNTER — Encounter (HOSPITAL_COMMUNITY)

## 2023-11-02 DIAGNOSIS — H25813 Combined forms of age-related cataract, bilateral: Secondary | ICD-10-CM | POA: Diagnosis not present

## 2023-11-03 ENCOUNTER — Encounter (HOSPITAL_COMMUNITY)

## 2023-11-22 DIAGNOSIS — H04123 Dry eye syndrome of bilateral lacrimal glands: Secondary | ICD-10-CM | POA: Diagnosis not present

## 2023-11-22 DIAGNOSIS — H02834 Dermatochalasis of left upper eyelid: Secondary | ICD-10-CM | POA: Diagnosis not present

## 2023-11-22 DIAGNOSIS — H16223 Keratoconjunctivitis sicca, not specified as Sjogren's, bilateral: Secondary | ICD-10-CM | POA: Diagnosis not present

## 2023-11-22 DIAGNOSIS — H02822 Cysts of right lower eyelid: Secondary | ICD-10-CM | POA: Diagnosis not present

## 2023-11-22 DIAGNOSIS — H43813 Vitreous degeneration, bilateral: Secondary | ICD-10-CM | POA: Diagnosis not present

## 2023-11-22 DIAGNOSIS — H2513 Age-related nuclear cataract, bilateral: Secondary | ICD-10-CM | POA: Diagnosis not present

## 2023-11-22 DIAGNOSIS — H02831 Dermatochalasis of right upper eyelid: Secondary | ICD-10-CM | POA: Diagnosis not present

## 2023-11-30 ENCOUNTER — Other Ambulatory Visit (HOSPITAL_COMMUNITY): Payer: Self-pay | Admitting: Internal Medicine

## 2023-12-12 NOTE — H&P (Signed)
 Surgical History & Physical  Patient Name: Melissa Weaver  DOB: January 23, 1950  Surgery: Cataract extraction with intraocular lens implant phacoemulsification; Right Eye Surgeon: Marsa Cleverly MD Surgery Date: 12/20/2023 Pre-Op Date: 11/22/2023  HPI: A 34 Yr. old female patient 1. The patient complains of difficulty when recognizing people. Both eyes are affected. The episode is constant. Symptoms occur when the patient is driving and reading. The complaint is associated with blurry vision. This is negatively affecting the patient's quality of life and the patient is unable to function adequately in life with the current level of vision.  Medical History:  Arthritis LDL Lung Problems  Review of Systems Musculoskeletal Arthritis pains Respiratory COPD All recorded systems are negative except as noted above.  Social Current every day smoker of Cigarettes   Medication Prednisolone-moxiflox-bromfen,  Tramadol, Albuterol sulfate, Alprazolam , Pantoprazole, Escitalopram  oxalate, Potassium chloride, Furosemide  Sx/Procedures None  Drug Allergies  NKDA  History & Physical: Heent: cataracts NECK: supple without bruits LUNGS: lungs clear to auscultation CV: regular rate and rhythm Abdomen: soft and non-tender  Impression & Plan: Assessment: 1.  CATARACT NUCLEAR SCLEROSIS AGE RELATED; Both Eyes (H25.13) 2.  DERMATOCHALASIS, no surgery; Right Upper Lid, Left Upper Lid (H02.831, H02.834) 3.  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  Posterior Vitreous Detachment; Both Eyes (H43.813) 7.  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  Cataracts are visually significant and account for the patient's complaints. Discussed all risks, benefits, procedures and recovery, including infection, loss of vision and eye, need for glasses after surgery or additional  procedures. Patient understands changing glasses will not improve vision. Patient indicated understanding of procedure. All questions answered. Patient desires to have surgery, recommend phacoemulsification with intraocular lens. Patient to have preliminary testing necessary ([employer]/IOL Master, Mac OCT, TOPO) Educational materials provided:Cataract.  Plan: - Proceed with cataract surgery OD followed by OS - Plan for best distance target with DIB00 - No DM, no fuchs, no prior eye surgery - good dilation - Astigmatism will limit vision and may need glasses afterward - Dextenza  if available  2.  Asymptomatic, recommend observation for now. Findings, prognosis and treatment options reviewed.  3.  Papilloma to lower lateral lid margin wtih 4 mm base by 3 mm depth with no lid notching or madarosis  4.  Dry eye. Mild signs of dry eye at this time. Can use artificial tears QID OU PRN and warm compresses once daily as needed.  5.  Dry eye. Mild signs of dry eye at this time. Can use artificial tears QID OU PRN and warm compresses once daily as needed.  6.  No retinal tear or retinal detachment. I told the patient to contact me ASAP for new onset/worsening of floaters, flashes, or vision loss.  7.  OCT with some asymmetry as well as direct view. Will complete work up after cataract extraction

## 2023-12-13 DIAGNOSIS — E1165 Type 2 diabetes mellitus with hyperglycemia: Secondary | ICD-10-CM | POA: Diagnosis not present

## 2023-12-13 DIAGNOSIS — Z79899 Other long term (current) drug therapy: Secondary | ICD-10-CM | POA: Diagnosis not present

## 2023-12-13 DIAGNOSIS — I517 Cardiomegaly: Secondary | ICD-10-CM | POA: Diagnosis not present

## 2023-12-13 DIAGNOSIS — M48062 Spinal stenosis, lumbar region with neurogenic claudication: Secondary | ICD-10-CM | POA: Diagnosis not present

## 2023-12-13 DIAGNOSIS — M810 Age-related osteoporosis without current pathological fracture: Secondary | ICD-10-CM | POA: Diagnosis not present

## 2023-12-13 DIAGNOSIS — I739 Peripheral vascular disease, unspecified: Secondary | ICD-10-CM | POA: Diagnosis not present

## 2023-12-13 DIAGNOSIS — Z23 Encounter for immunization: Secondary | ICD-10-CM | POA: Diagnosis not present

## 2023-12-13 DIAGNOSIS — I700 Atherosclerosis of aorta: Secondary | ICD-10-CM | POA: Diagnosis not present

## 2023-12-14 ENCOUNTER — Encounter (HOSPITAL_COMMUNITY): Payer: Self-pay

## 2023-12-14 ENCOUNTER — Other Ambulatory Visit: Payer: Self-pay

## 2023-12-14 ENCOUNTER — Encounter (HOSPITAL_COMMUNITY)
Admission: RE | Admit: 2023-12-14 | Discharge: 2023-12-14 | Disposition: A | Discharge status: Home / Self Care | Source: Ambulatory Visit | Attending: Optometry | Admitting: Optometry

## 2023-12-14 DIAGNOSIS — H2511 Age-related nuclear cataract, right eye: Secondary | ICD-10-CM | POA: Diagnosis not present

## 2023-12-14 HISTORY — DX: Type 2 diabetes mellitus without complications: E11.9

## 2023-12-20 ENCOUNTER — Encounter (HOSPITAL_COMMUNITY)
Admission: RE | Disposition: A | Discharge status: Home / Self Care | Payer: Self-pay | Source: Home / Self Care | Attending: Optometry

## 2023-12-20 ENCOUNTER — Encounter (HOSPITAL_COMMUNITY): Payer: Self-pay | Admitting: Optometry

## 2023-12-20 ENCOUNTER — Ambulatory Visit (HOSPITAL_COMMUNITY)

## 2023-12-20 ENCOUNTER — Ambulatory Visit (HOSPITAL_COMMUNITY)
Admission: RE | Admit: 2023-12-20 | Discharge: 2023-12-20 | Disposition: A | Discharge status: Home / Self Care | Attending: Optometry | Admitting: Optometry

## 2023-12-20 DIAGNOSIS — R569 Unspecified convulsions: Secondary | ICD-10-CM | POA: Diagnosis not present

## 2023-12-20 DIAGNOSIS — H2513 Age-related nuclear cataract, bilateral: Secondary | ICD-10-CM | POA: Diagnosis not present

## 2023-12-20 DIAGNOSIS — H16223 Keratoconjunctivitis sicca, not specified as Sjogren's, bilateral: Secondary | ICD-10-CM | POA: Diagnosis not present

## 2023-12-20 DIAGNOSIS — F17200 Nicotine dependence, unspecified, uncomplicated: Secondary | ICD-10-CM | POA: Diagnosis not present

## 2023-12-20 DIAGNOSIS — I10 Essential (primary) hypertension: Secondary | ICD-10-CM | POA: Diagnosis not present

## 2023-12-20 DIAGNOSIS — Z79899 Other long term (current) drug therapy: Secondary | ICD-10-CM | POA: Diagnosis not present

## 2023-12-20 DIAGNOSIS — H2511 Age-related nuclear cataract, right eye: Secondary | ICD-10-CM | POA: Diagnosis not present

## 2023-12-20 DIAGNOSIS — E1136 Type 2 diabetes mellitus with diabetic cataract: Secondary | ICD-10-CM | POA: Diagnosis not present

## 2023-12-20 DIAGNOSIS — J449 Chronic obstructive pulmonary disease, unspecified: Secondary | ICD-10-CM | POA: Diagnosis not present

## 2023-12-20 DIAGNOSIS — F419 Anxiety disorder, unspecified: Secondary | ICD-10-CM | POA: Diagnosis not present

## 2023-12-20 DIAGNOSIS — M1990 Unspecified osteoarthritis, unspecified site: Secondary | ICD-10-CM | POA: Diagnosis not present

## 2023-12-20 DIAGNOSIS — H02831 Dermatochalasis of right upper eyelid: Secondary | ICD-10-CM | POA: Diagnosis not present

## 2023-12-20 DIAGNOSIS — F17210 Nicotine dependence, cigarettes, uncomplicated: Secondary | ICD-10-CM | POA: Diagnosis not present

## 2023-12-20 DIAGNOSIS — H02834 Dermatochalasis of left upper eyelid: Secondary | ICD-10-CM | POA: Diagnosis not present

## 2023-12-20 DIAGNOSIS — H02822 Cysts of right lower eyelid: Secondary | ICD-10-CM | POA: Diagnosis not present

## 2023-12-20 DIAGNOSIS — E1151 Type 2 diabetes mellitus with diabetic peripheral angiopathy without gangrene: Secondary | ICD-10-CM | POA: Diagnosis not present

## 2023-12-20 DIAGNOSIS — H04123 Dry eye syndrome of bilateral lacrimal glands: Secondary | ICD-10-CM | POA: Diagnosis not present

## 2023-12-20 HISTORY — PX: CATARACT EXTRACTION W/PHACO: SHX586

## 2023-12-20 HISTORY — PX: INSERTION, STENT, DRUG-ELUTING, LACRIMAL CANALICULUS: SHX7453

## 2023-12-20 LAB — GLUCOSE, CAPILLARY: Glucose-Capillary: 128 mg/dL — ABNORMAL HIGH (ref 70–99)

## 2023-12-20 SURGERY — PHACOEMULSIFICATION, CATARACT, WITH IOL INSERTION
Anesthesia: Monitor Anesthesia Care | Site: Eye | Laterality: Right

## 2023-12-20 MED ORDER — PHENYLEPHRINE-KETOROLAC 1-0.3 % IO SOLN
INTRAOCULAR | Status: DC | PRN
  Administered 2023-12-20: 500 mL via OPHTHALMIC

## 2023-12-20 MED ORDER — TETRACAINE HCL 0.5 % OP SOLN
1.0000 [drp] | OPHTHALMIC | Status: AC
  Administered 2023-12-20 (×3): 1 [drp] via OPHTHALMIC

## 2023-12-20 MED ORDER — MIDAZOLAM HCL 2 MG/2ML IJ SOLN
INTRAMUSCULAR | Status: AC
  Filled 2023-12-20: qty 2

## 2023-12-20 MED ORDER — POVIDONE-IODINE 5 % OP SOLN
OPHTHALMIC | Status: DC | PRN
  Administered 2023-12-20: 1 via OPHTHALMIC

## 2023-12-20 MED ORDER — LIDOCAINE HCL 3.5 % OP GEL
1.0000 | Freq: Once | OPHTHALMIC | Status: AC
  Administered 2023-12-20: 1 via OPHTHALMIC

## 2023-12-20 MED ORDER — TROPICAMIDE 1 % OP SOLN
1.0000 [drp] | OPHTHALMIC | Status: AC
  Administered 2023-12-20 (×3): 1 [drp] via OPHTHALMIC

## 2023-12-20 MED ORDER — SIGHTPATH DOSE#1 NA HYALUR & NA CHOND-NA HYALUR IO KIT
PACK | INTRAOCULAR | Status: DC | PRN
  Administered 2023-12-20: 1 via OPHTHALMIC

## 2023-12-20 MED ORDER — SODIUM CHLORIDE 0.9% FLUSH
INTRAVENOUS | Status: DC | PRN
  Administered 2023-12-20: 5 mL via INTRAVENOUS

## 2023-12-20 MED ORDER — DEXAMETHASONE 0.4 MG OP INST
VAGINAL_INSERT | OPHTHALMIC | Status: AC
  Filled 2023-12-20: qty 1

## 2023-12-20 MED ORDER — MIDAZOLAM HCL (PF) 2 MG/2ML IJ SOLN
INTRAMUSCULAR | Status: DC | PRN
  Administered 2023-12-20: 1 mg via INTRAVENOUS

## 2023-12-20 MED ORDER — MOXIFLOXACIN HCL 5 MG/ML IO SOLN
INTRAOCULAR | Status: DC | PRN
  Administered 2023-12-20: .2 mL via INTRACAMERAL

## 2023-12-20 MED ORDER — BSS IO SOLN
INTRAOCULAR | Status: DC | PRN
  Administered 2023-12-20: 15 mL via INTRAOCULAR

## 2023-12-20 MED ORDER — DEXAMETHASONE 0.4 MG OP INST
VAGINAL_INSERT | OPHTHALMIC | Status: DC | PRN
  Administered 2023-12-20: .4 mg via OPHTHALMIC

## 2023-12-20 MED ORDER — PHENYLEPHRINE HCL 2.5 % OP SOLN
1.0000 [drp] | OPHTHALMIC | Status: AC
  Administered 2023-12-20 (×3): 1 [drp] via OPHTHALMIC

## 2023-12-20 MED ORDER — STERILE WATER FOR IRRIGATION IR SOLN
Status: DC | PRN
  Administered 2023-12-20: 1

## 2023-12-20 MED ORDER — LIDOCAINE HCL (PF) 1 % IJ SOLN
INTRAMUSCULAR | Status: DC | PRN
  Administered 2023-12-20: 1 mL

## 2023-12-20 SURGICAL SUPPLY — 11 items
CLOTH BEACON ORANGE TIMEOUT ST (SAFETY) ×1 IMPLANT
DRSG TEGADERM 4X4.75 (GAUZE/BANDAGES/DRESSINGS) ×1 IMPLANT
EYE SHIELD UNIVERSAL CLEAR (GAUZE/BANDAGES/DRESSINGS) IMPLANT
FEE CATARACT SUITE SIGHTPATH (MISCELLANEOUS) ×1 IMPLANT
GLOVE BIOGEL PI IND STRL 7.0 (GLOVE) ×2 IMPLANT
LENS IOL TECNIS EYHANCE 21.0 (Intraocular Lens) IMPLANT
NDL HYPO 18GX1.5 BLUNT FILL (NEEDLE) ×1 IMPLANT
PAD ARMBOARD POSITIONER FOAM (MISCELLANEOUS) ×1 IMPLANT
SYR TB 1ML LL NO SAFETY (SYRINGE) ×1 IMPLANT
TAPE SURG TRANSPORE 1 IN (GAUZE/BANDAGES/DRESSINGS) IMPLANT
WATER STERILE IRR 250ML POUR (IV SOLUTION) ×1 IMPLANT

## 2023-12-20 NOTE — Transfer of Care (Signed)
 Immediate Anesthesia Transfer of Care Note  Patient: Melissa Weaver  Procedure(s) Performed: PHACOEMULSIFICATION, CATARACT, WITH IOL INSERTION (Right: Eye) INSERTION, STENT, DRUG-ELUTING, LACRIMAL CANALICULUS (Right: Eye)  Patient Location: Short Stay  Anesthesia Type:MAC  Level of Consciousness: awake, alert , and oriented  Airway & Oxygen Therapy: Patient Spontanous Breathing  Post-op Assessment: Report given to RN and Post -op Vital signs reviewed and stable  Post vital signs: Reviewed and stable  Last Vitals:  Vitals Value Taken Time  BP 128/71   Temp    Pulse 87   Resp 14   SpO2 96%     Last Pain:  Vitals:   12/20/23 0657  TempSrc: Oral  PainSc: 0-No pain      Patients Stated Pain Goal: 6 (12/20/23 0657)  Complications: No notable events documented.

## 2023-12-20 NOTE — Anesthesia Preprocedure Evaluation (Signed)
 Anesthesia Evaluation  Patient identified by MRN, date of birth, ID band Patient awake    Reviewed: Allergy & Precautions, H&P , NPO status , Patient's Chart, lab work & pertinent test results  History of Anesthesia Complications (+) PONV, Family history of anesthesia reaction and history of anesthetic complications  Airway Mallampati: II  TM Distance: >3 FB Neck ROM: Full    Dental no notable dental hx.    Pulmonary asthma , COPD, Current Smoker and Patient abstained from smoking.   Pulmonary exam normal breath sounds clear to auscultation       Cardiovascular hypertension, + Peripheral Vascular Disease  Normal cardiovascular exam Rhythm:Regular Rate:Normal     Neuro/Psych Seizures -,  PSYCHIATRIC DISORDERS Anxiety Depression       GI/Hepatic Neg liver ROS,GERD  ,,  Endo/Other  diabetes    Renal/GU negative Renal ROS  negative genitourinary   Musculoskeletal  (+) Arthritis ,    Abdominal   Peds negative pediatric ROS (+)  Hematology negative hematology ROS (+)   Anesthesia Other Findings   Reproductive/Obstetrics negative OB ROS                              Anesthesia Physical Anesthesia Plan  ASA: 2  Anesthesia Plan: MAC   Post-op Pain Management:    Induction:   PONV Risk Score and Plan:   Airway Management Planned: Nasal Cannula  Additional Equipment:   Intra-op Plan:   Post-operative Plan:   Informed Consent: I have reviewed the patients History and Physical, chart, labs and discussed the procedure including the risks, benefits and alternatives for the proposed anesthesia with the patient or authorized representative who has indicated his/her understanding and acceptance.     Dental advisory given  Plan Discussed with: CRNA  Anesthesia Plan Comments:         Anesthesia Quick Evaluation

## 2023-12-20 NOTE — Anesthesia Postprocedure Evaluation (Signed)
 Anesthesia Post Note  Patient: LAVETTA GEIER  Procedure(s) Performed: PHACOEMULSIFICATION, CATARACT, WITH IOL INSERTION (Right: Eye) INSERTION, STENT, DRUG-ELUTING, LACRIMAL CANALICULUS (Right: Eye)  Patient location during evaluation: PACU Anesthesia Type: MAC Level of consciousness: awake and alert Pain management: pain level controlled Vital Signs Assessment: post-procedure vital signs reviewed and stable Respiratory status: spontaneous breathing, nonlabored ventilation, respiratory function stable and patient connected to nasal cannula oxygen Cardiovascular status: stable and blood pressure returned to baseline Postop Assessment: no apparent nausea or vomiting Anesthetic complications: no   No notable events documented.   Last Vitals:  Vitals:   12/20/23 0657 12/20/23 0749  BP: 122/72 128/71  Pulse: 88 81  Resp: 18 16  Temp: 37.1 C 36.6 C  SpO2: 95% 97%    Last Pain:  Vitals:   12/20/23 0749  TempSrc: Oral  PainSc: 0-No pain                 Andrea Limes

## 2023-12-20 NOTE — Discharge Instructions (Signed)
 Please discharge patient when stable, will follow up today with Dr. Ilsa Iha at the [facility] office immediately following discharge.  Leave shield in place until visit.  All paperwork with discharge instructions will be given at the office.  [facility] Address:  [address]  [place], [place] [address]  Dr. Chaya Jan Phone: [phone]

## 2023-12-20 NOTE — Interval H&P Note (Signed)
 History and Physical Interval Note:  12/20/2023 7:17 AM  Melissa Weaver  has presented today for surgery, with the diagnosis of nuclear sclerotic cataract, right eye.  The various methods of treatment have been discussed with the patient and family. After consideration of risks, benefits and other options for treatment, the patient has consented to  Procedure(s): PHACOEMULSIFICATION, CATARACT, WITH IOL INSERTION (Right) INSERTION, STENT, DRUG-ELUTING, LACRIMAL CANALICULUS (Right) as a surgical intervention.  The patient's history has been reviewed, patient examined, no change in status, stable for surgery.  I have reviewed the patient's chart and labs.  Questions were answered to the patient's satisfaction.    The H and P was reviewed and updated. The patient was examined.  No changes were found after exam.  The surgical eye was marked.    Melissa Weaver

## 2023-12-20 NOTE — Op Note (Addendum)
 Date of procedure: 12/20/23  Pre-operative diagnosis: Visually significant age-related nuclear cataract, Right Eye (H25.11)  Post-operative diagnosis: Visually significant age-related nuclear cataract, Right Eye H25.11; Ocular Pain and Inflammation, Right eye H57.11  Procedure: Removal of cataract via phacoemulsification and insertion of intra-ocular lens J&J DIBOO +21.0D into the capsular bag of the Right Eye, Dextenza  Implantation into right lower punctum CPT [phone]  Attending surgeon: Marsa Cleverly, MD  Anesthesia: MAC, Topical Akten  Complications: None  Estimated Blood Loss: <35mL (minimal)  Specimens: None  Implants:  Implant Name Type Inv. Item Serial No. Manufacturer Lot No. LRB No. Used Action  LENS IOL TECNIS EYHANCE 21.0 - [id number] Intraocular Lens LENS IOL TECNIS EYHANCE 21.0 [id number] SIGHTPATH  Right 1 Implanted    Indications:  Visually significant age-related cataract, Right Eye  Procedure:  The patient was seen and identified in the pre-operative area. The operative eye was identified and dilated.  The operative eye was marked.  Topical anesthesia was administered to the operative eye.     The patient was then to the operative suite and placed in the supine position.  A timeout was performed confirming the patient, procedure to be performed, and all other relevant information.   The patient's face was prepped and draped in the usual fashion for intra-ocular surgery.  A lid speculum was placed into the operative eye and the surgical microscope moved into place and focused.  A superotemporal paracentesis was created using a 20 gauge paracentesis blade.  BSS mixed with Omidria, followed by 1% lidocaine  was injected into the anterior chamber.  Viscoelastic was injected into the anterior chamber.  A temporal clear-corneal main wound incision was created using a 2.45mm microkeratome.  A continuous curvilinear capsulorrhexis was initiated using an irrigating cystitome and  completed using capsulorrhexis forceps.  Hydrodissection and hydrodeliniation were performed.  Viscoelastic was injected into the anterior chamber.  A phacoemulsification handpiece and a chopper as a second instrument were used to remove the nucleus and epinucleus. The irrigation/aspiration handpiece was used to remove any remaining cortical material.   The capsular bag was reinflated with viscoelastic, checked, and found to be intact.  The intraocular lens was inserted into the capsular bag.  The irrigation/aspiration handpiece was used to remove any remaining viscoelastic.  The clear corneal wound and paracentesis wounds were then hydrated and checked with Weck-Cels to be watertight. Moxifloxacin was instilled into the anterior chamber.  The lid-speculum and drape were removed. The lower punctum was dilated, and the dextenza  implant was inserted into it. The patient's face was cleaned with a wet and dry 4x4. A clear shield was taped over the eye. The patient was taken to the post-operative care unit in good condition, having tolerated the procedure well.  Post-Op Instructions: The patient will follow up at [facility] for a same day post-operative evaluation and will receive all other orders and instructions.

## 2023-12-21 ENCOUNTER — Encounter (HOSPITAL_COMMUNITY): Payer: Self-pay | Admitting: Optometry

## 2023-12-29 ENCOUNTER — Encounter (HOSPITAL_COMMUNITY)
Admission: RE | Admit: 2023-12-29 | Discharge: 2023-12-29 | Disposition: A | Discharge status: Home / Self Care | Source: Ambulatory Visit | Attending: Optometry | Admitting: Optometry

## 2023-12-29 ENCOUNTER — Encounter (HOSPITAL_COMMUNITY): Payer: Self-pay

## 2023-12-29 NOTE — H&P (Signed)
 Surgical History & Physical  Patient Name: Melissa Weaver  DOB: 1950/05/14  Surgery: Cataract extraction with intraocular lens implant phacoemulsification; Left Eye Surgeon: Marsa Cleverly MD Surgery Date: 12/30/2023 Pre-Op Date: 12/29/2023  HPI: A 73 Yr. old female patient 1. The patient is returning after cataract surgery. The right eye is affected. Status post cataract surgery, which began 8 days ago: Since the last visit, the affected area feels improvement. The patient's vision is improved. The condition's severity is constant. Patient is following medication instructions.  2. The patient is returning for a cataract follow-up of the left eye. Since the last visit, the affected area is tolerating. The patient's vision is blurry. The condition's severity is constant. Patient is not taking medications. This is negatively affecting the patient's quality of life and the patient is unable to function adequately in life with the current level of vision.  Medical History:  Arthritis LDL Lung Problems  Review of Systems Musculoskeletal Arthritis pains Respiratory COPD All recorded systems are negative except as noted above.  Social Current every day smoker of Cigarettes   Medication Prednisolone-moxiflox-bromfen,  Tramadol, Albuterol sulfate, Alprazolam , Pantoprazole, Escitalopram  oxalate, Potassium chloride, Furosemide  Sx/Procedures Phaco c IOL OD-Dextenza   Drug Allergies  NKDA  History & Physical: Heent: cataract NECK: supple without bruits LUNGS: lungs clear to auscultation CV: regular rate and rhythm Abdomen: soft and non-tender  Impression & Plan: Assessment: 1.  CATARACT EXTRACTION STATUS; Right Eye (Z98.41) 2.  INTRAOCULAR LENS IOL (Z96.1) 3.  CATARACT NUCLEAR SCLEROSIS AGE RELATED; Both Eyes (H25.13)  Plan: 1.  CE/PCIOL OD with Dextenza  12/20/23 POD#8 Continue with eye drops as directed. Reviewed all post-op precautions. Call with increased redness, swelling,  pain or loss of vision. Avoid swimming pools and hot tubs for 1 week.  2.  Doing well since surgery  3.  Cataracts are visually significant and account for the patient's complaints. Discussed all risks, benefits, procedures and recovery, including infection, loss of vision and eye, need for glasses after surgery or additional procedures. Patient understands changing glasses will not improve vision. Patient indicated understanding of procedure. All questions answered. Patient desires to have surgery, recommend phacoemulsification with intraocular lens. Patient to have preliminary testing necessary ([employer]/IOL Master, Mac OCT, TOPO) Educational materials provided:Cataract.  Plan: - Proceed with cataract surgery OS when ready - Plan for best distance target with DIB00 - No DM, no fuchs, no prior eye surgery - good dilation - Astigmatism will limit vision and may need glasses afterward - Dextenza  if available

## 2023-12-30 ENCOUNTER — Ambulatory Visit (HOSPITAL_COMMUNITY): Admitting: Anesthesiology

## 2023-12-30 ENCOUNTER — Ambulatory Visit (HOSPITAL_COMMUNITY)
Admission: RE | Admit: 2023-12-30 | Discharge: 2023-12-30 | Disposition: A | Discharge status: Home / Self Care | Attending: Optometry | Admitting: Optometry

## 2023-12-30 ENCOUNTER — Encounter (HOSPITAL_COMMUNITY)
Admission: RE | Disposition: A | Discharge status: Home / Self Care | Payer: Self-pay | Source: Home / Self Care | Attending: Optometry

## 2023-12-30 ENCOUNTER — Encounter (HOSPITAL_COMMUNITY): Payer: Self-pay | Admitting: Optometry

## 2023-12-30 DIAGNOSIS — H2512 Age-related nuclear cataract, left eye: Secondary | ICD-10-CM | POA: Diagnosis present

## 2023-12-30 DIAGNOSIS — K219 Gastro-esophageal reflux disease without esophagitis: Secondary | ICD-10-CM | POA: Diagnosis not present

## 2023-12-30 DIAGNOSIS — F17210 Nicotine dependence, cigarettes, uncomplicated: Secondary | ICD-10-CM | POA: Diagnosis not present

## 2023-12-30 DIAGNOSIS — H5712 Ocular pain, left eye: Secondary | ICD-10-CM | POA: Diagnosis present

## 2023-12-30 DIAGNOSIS — Z961 Presence of intraocular lens: Secondary | ICD-10-CM | POA: Diagnosis not present

## 2023-12-30 DIAGNOSIS — E119 Type 2 diabetes mellitus without complications: Secondary | ICD-10-CM | POA: Diagnosis not present

## 2023-12-30 DIAGNOSIS — J449 Chronic obstructive pulmonary disease, unspecified: Secondary | ICD-10-CM | POA: Diagnosis not present

## 2023-12-30 DIAGNOSIS — I10 Essential (primary) hypertension: Secondary | ICD-10-CM | POA: Diagnosis not present

## 2023-12-30 DIAGNOSIS — I739 Peripheral vascular disease, unspecified: Secondary | ICD-10-CM | POA: Diagnosis not present

## 2023-12-30 DIAGNOSIS — Z9841 Cataract extraction status, right eye: Secondary | ICD-10-CM | POA: Diagnosis not present

## 2023-12-30 LAB — GLUCOSE, CAPILLARY: Glucose-Capillary: 111 mg/dL — ABNORMAL HIGH (ref 70–99)

## 2023-12-30 SURGERY — PHACOEMULSIFICATION, CATARACT, WITH IOL INSERTION
Anesthesia: Monitor Anesthesia Care | Site: Eye | Laterality: Left

## 2023-12-30 MED ORDER — POVIDONE-IODINE 5 % OP SOLN
OPHTHALMIC | Status: DC | PRN
  Administered 2023-12-30: 1 via OPHTHALMIC

## 2023-12-30 MED ORDER — SODIUM CHLORIDE 0.9% FLUSH
INTRAVENOUS | Status: DC | PRN
  Administered 2023-12-30: 8 mL via INTRAVENOUS

## 2023-12-30 MED ORDER — DEXAMETHASONE 0.4 MG OP INST
VAGINAL_INSERT | OPHTHALMIC | Status: AC
  Filled 2023-12-30: qty 1

## 2023-12-30 MED ORDER — BSS IO SOLN
INTRAOCULAR | Status: DC | PRN
  Administered 2023-12-30: 15 mL via INTRAOCULAR

## 2023-12-30 MED ORDER — PHENYLEPHRINE HCL 2.5 % OP SOLN
1.0000 [drp] | OPHTHALMIC | Status: AC | PRN
  Administered 2023-12-30 (×3): 1 [drp] via OPHTHALMIC

## 2023-12-30 MED ORDER — MOXIFLOXACIN HCL 5 MG/ML IO SOLN
INTRAOCULAR | Status: DC | PRN
  Administered 2023-12-30: .3 mL via INTRACAMERAL

## 2023-12-30 MED ORDER — TROPICAMIDE 1 % OP SOLN
1.0000 [drp] | OPHTHALMIC | Status: AC | PRN
  Administered 2023-12-30 (×3): 1 [drp] via OPHTHALMIC

## 2023-12-30 MED ORDER — SODIUM CHLORIDE (PF) 0.9 % IJ SOLN
INTRAMUSCULAR | Status: AC
  Filled 2023-12-30: qty 10

## 2023-12-30 MED ORDER — LACTATED RINGERS IV SOLN: INTRAVENOUS | Status: DC

## 2023-12-30 MED ORDER — LIDOCAINE HCL 3.5 % OP GEL
1.0000 | Freq: Once | OPHTHALMIC | Status: AC
  Administered 2023-12-30: 1 via OPHTHALMIC

## 2023-12-30 MED ORDER — SIGHTPATH DOSE#1 NA HYALUR & NA CHOND-NA HYALUR IO KIT
PACK | INTRAOCULAR | Status: DC | PRN
  Administered 2023-12-30: 1 via OPHTHALMIC

## 2023-12-30 MED ORDER — TETRACAINE HCL 0.5 % OP SOLN
1.0000 [drp] | OPHTHALMIC | Status: AC | PRN
  Administered 2023-12-30 (×3): 1 [drp] via OPHTHALMIC

## 2023-12-30 MED ORDER — MIDAZOLAM HCL 2 MG/2ML IJ SOLN
INTRAMUSCULAR | Status: AC
  Filled 2023-12-30: qty 2

## 2023-12-30 MED ORDER — LIDOCAINE HCL (PF) 1 % IJ SOLN
INTRAMUSCULAR | Status: DC | PRN
  Administered 2023-12-30: 1 mL

## 2023-12-30 MED ORDER — DEXAMETHASONE 0.4 MG OP INST
VAGINAL_INSERT | OPHTHALMIC | Status: DC | PRN
  Administered 2023-12-30: .4 mg via OPHTHALMIC

## 2023-12-30 MED ADMIN — Tetracaine HCl Ophth Soln 0.5%: 1 [drp] | OPHTHALMIC | NDC 99999080057

## 2023-12-30 MED ADMIN — SIGHTPATH DOSE#1 PHENYLEPHRINE 1%/KETOROLAC 0.3% (OMIDRIA) IN BALANCED SALT SOLUTION (BSS) OPTIME: 500 mL | OPHTHALMIC | NDC 82604060004

## 2023-12-30 MED ADMIN — Midazolam HCl Inj 5 MG/5ML (Base Equivalent): 1 mg | INTRAVENOUS | NDC 23155060031

## 2023-12-30 MED ADMIN — Water For Irrigation, Sterile Irrigation Soln: 1 | NDC 00338000344

## 2023-12-30 MED FILL — Midazolam HCl Inj 2 MG/2ML (Base Equivalent): INTRAMUSCULAR | Qty: 2 | Status: CN

## 2023-12-30 SURGICAL SUPPLY — 11 items
CLOTH BEACON ORANGE TIMEOUT ST (SAFETY) ×1 IMPLANT
DRSG TEGADERM 4X4.75 (GAUZE/BANDAGES/DRESSINGS) ×1 IMPLANT
EYE SHIELD UNIVERSAL CLEAR (GAUZE/BANDAGES/DRESSINGS) IMPLANT
FEE CATARACT SUITE SIGHTPATH (MISCELLANEOUS) ×1 IMPLANT
GLOVE BIOGEL PI IND STRL 7.0 (GLOVE) ×2 IMPLANT
LENS IOL TECNIS EYHANCE 21.5 (Intraocular Lens) IMPLANT
NDL HYPO 18GX1.5 BLUNT FILL (NEEDLE) ×1 IMPLANT
PAD ARMBOARD POSITIONER FOAM (MISCELLANEOUS) ×1 IMPLANT
SYR TB 1ML LL NO SAFETY (SYRINGE) ×1 IMPLANT
TAPE SURG TRANSPORE 1 IN (GAUZE/BANDAGES/DRESSINGS) IMPLANT
WATER STERILE IRR 250ML POUR (IV SOLUTION) ×1 IMPLANT

## 2023-12-30 NOTE — Transfer of Care (Signed)
 Immediate Anesthesia Transfer of Care Note  Patient: Melissa Weaver  Procedure(s) Performed: PHACOEMULSIFICATION, CATARACT, WITH IOL INSERTION (Left: Eye) INSERTION, STENT, DRUG-ELUTING, LACRIMAL CANALICULUS (Left: Eye)  Patient Location: Short Stay  Anesthesia Type:MAC  Level of Consciousness: awake  Airway & Oxygen Therapy: Patient Spontanous Breathing  Post-op Assessment: Report given to RN  Post vital signs: Reviewed and stable  Last Vitals:  Vitals Value Taken Time  BP    Temp    Pulse    Resp    SpO2      Last Pain:  Vitals:   12/30/23 0953  PainSc: 0-No pain         Complications: No notable events documented.

## 2023-12-30 NOTE — Anesthesia Postprocedure Evaluation (Signed)
 Anesthesia Post Note  Patient: Melissa Weaver  Procedure(s) Performed: PHACOEMULSIFICATION, CATARACT, WITH IOL INSERTION (Left: Eye) INSERTION, STENT, DRUG-ELUTING, LACRIMAL CANALICULUS (Left: Eye)  Patient location during evaluation: Short Stay Anesthesia Type: MAC Level of consciousness: awake and alert Pain management: pain level controlled Vital Signs Assessment: post-procedure vital signs reviewed and stable Respiratory status: spontaneous breathing Cardiovascular status: blood pressure returned to baseline and stable Postop Assessment: no apparent nausea or vomiting Anesthetic complications: no   No notable events documented.   Last Vitals:  Vitals:   12/30/23 0954 12/30/23 1119  BP: 127/68 132/65  Pulse:  74  Resp: 16 14  Temp: 36.9 C 36.6 C  SpO2: 97% 95%    Last Pain:  Vitals:   12/30/23 1119  TempSrc: Oral  PainSc: 0-No pain                 Dao Memmott

## 2023-12-30 NOTE — Interval H&P Note (Signed)
 History and Physical Interval Note:  12/30/2023 10:13 AM  Melissa Weaver  has presented today for surgery, with the diagnosis of nuclear sclerotic cataract, left eye.  The various methods of treatment have been discussed with the patient and family. After consideration of risks, benefits and other options for treatment, the patient has consented to  Procedures: PHACOEMULSIFICATION, CATARACT, WITH IOL INSERTION (Left) INSERTION, STENT, DRUG-ELUTING, LACRIMAL CANALICULUS (Left) as a surgical intervention.  The patient's history has been reviewed, patient examined, no change in status, stable for surgery.  I have reviewed the patient's chart and labs.  Questions were answered to the patient's satisfaction.     Kennah Hehr

## 2023-12-30 NOTE — Anesthesia Preprocedure Evaluation (Signed)
 Anesthesia Evaluation  Patient identified by MRN, date of birth, ID band Patient awake    Reviewed: Allergy & Precautions, H&P , NPO status , Patient's Chart, lab work & pertinent test results  History of Anesthesia Complications (+) PONV, Family history of anesthesia reaction and history of anesthetic complications  Airway Mallampati: II  TM Distance: >3 FB Neck ROM: Full    Dental no notable dental hx.    Pulmonary asthma , COPD, Current Smoker and Patient abstained from smoking.   Pulmonary exam normal breath sounds clear to auscultation       Cardiovascular hypertension, + Peripheral Vascular Disease  Normal cardiovascular exam Rhythm:Regular Rate:Normal     Neuro/Psych Seizures -,  PSYCHIATRIC DISORDERS Anxiety Depression       GI/Hepatic Neg liver ROS,GERD  ,,  Endo/Other  diabetes    Renal/GU negative Renal ROS  negative genitourinary   Musculoskeletal  (+) Arthritis ,    Abdominal   Peds negative pediatric ROS (+)  Hematology negative hematology ROS (+)   Anesthesia Other Findings   Reproductive/Obstetrics negative OB ROS                              Anesthesia Physical Anesthesia Plan  ASA: 2  Anesthesia Plan: MAC   Post-op Pain Management:    Induction:   PONV Risk Score and Plan:   Airway Management Planned: Nasal Cannula  Additional Equipment:   Intra-op Plan:   Post-operative Plan:   Informed Consent: I have reviewed the patients History and Physical, chart, labs and discussed the procedure including the risks, benefits and alternatives for the proposed anesthesia with the patient or authorized representative who has indicated his/her understanding and acceptance.     Dental advisory given  Plan Discussed with: CRNA  Anesthesia Plan Comments:         Anesthesia Quick Evaluation

## 2023-12-30 NOTE — Interval H&P Note (Signed)
 History and Physical Interval Note:  12/30/2023 10:22 AM  Melissa Weaver  has presented today for surgery, with the diagnosis of nuclear sclerotic cataract, left eye.  The various methods of treatment have been discussed with the patient and family. After consideration of risks, benefits and other options for treatment, the patient has consented to  Procedures: PHACOEMULSIFICATION, CATARACT, WITH IOL INSERTION (Left) INSERTION, STENT, DRUG-ELUTING, LACRIMAL CANALICULUS (Left) as a surgical intervention.  The patient's history has been reviewed, patient examined, no change in status, stable for surgery.  I have reviewed the patient's chart and labs.  Questions were answered to the patient's satisfaction.     [name]

## 2023-12-30 NOTE — Op Note (Signed)
 Date of procedure: 12/30/2023  Pre-operative diagnosis: Visually significant age-related nuclear cataract, Left Eye (H25.12)  Post-operative diagnosis: Visually significant age-related nuclear cataract, Left Eye H25.12; Ocular Pain and Inflammation, Left eye H57.12  Procedure: Removal of cataract via phacoemulsification and insertion of intra-ocular lens J&J DIB00 +21.5D into the capsular bag of the Left Eye, Dextenza  Implantation into left lower punctum CPT [phone]  Attending surgeon: Marsa JINNY Cleverly, MD  Anesthesia: MAC, Topical Akten   Complications: None  Estimated Blood Loss: <62mL (minimal)  Specimens: None  Implants:  Implant Name Type Inv. Item Serial No. Manufacturer Lot No. LRB No. Used Action  LENS IOL TECNIS EYHANCE 21.5 - [id number] Intraocular Lens LENS IOL TECNIS EYHANCE 21.5 [id number] SIGHTPATH  Left 1 Implanted    Indications:  Visually significant age-related cataract, Left Eye  Procedure:  The patient was seen and identified in the pre-operative area. The operative eye was identified and dilated.  The operative eye was marked.  Topical anesthesia was administered to the operative eye.     The patient was then to the operative suite and placed in the supine position.  A timeout was performed confirming the patient, procedure to be performed, and all other relevant information.   The patient's face was prepped and draped in the usual fashion for intra-ocular surgery.  A lid speculum was placed into the operative eye and the surgical microscope moved into place and focused.  An inferotemporal paracentesis was created using a 20 gauge paracentesis blade.  BSS mixed with Omidria , followed by 1% lidocaine  was injected into the anterior chamber.  Viscoelastic was injected into the anterior chamber.  A temporal clear-corneal main wound incision was created using a 2.56mm microkeratome.  A continuous curvilinear capsulorrhexis was initiated using an irrigating cystitome and  completed using capsulorrhexis forceps.  Hydrodissection and hydrodeliniation were performed.  Viscoelastic was injected into the anterior chamber.  A phacoemulsification handpiece and a chopper as a second instrument were used to remove the nucleus and epinucleus. The irrigation/aspiration handpiece was used to remove any remaining cortical material.   The capsular bag was reinflated with viscoelastic, checked, and found to be intact.  The intraocular lens was inserted into the capsular bag.  The irrigation/aspiration handpiece was used to remove any remaining viscoelastic.  The clear corneal wound and paracentesis wounds were then hydrated and checked with Weck-Cels to be watertight. Moxifloxacin  was instilled into the anterior chamber.  The lid-speculum and drape were removed. The lower punctum was dilated, and the dextenza  implant was inserted into it. The patient's face was cleaned with a wet and dry 4x4.  A clear shield was taped over the eye. The patient was taken to the post-operative care unit in good condition, having tolerated the procedure well.  Post-Op Instructions: The patient will follow up at [facility] for a same day post-operative evaluation and will receive all other orders and instructions.

## 2023-12-30 NOTE — Discharge Instructions (Signed)
 Please discharge patient when stable, will follow up today with Dr. Ilsa Iha at the [facility] office immediately following discharge.  Leave shield in place until visit.  All paperwork with discharge instructions will be given at the office.  [facility] Address:  [address]  [place], [place] [address]  Dr. Chaya Jan Phone: [phone]

## 2024-01-02 ENCOUNTER — Encounter (HOSPITAL_COMMUNITY): Payer: Self-pay | Admitting: Optometry

## 2024-01-03 ENCOUNTER — Encounter: Attending: Internal Medicine | Admitting: *Deleted

## 2024-01-03 VITALS — BP 130/80 | HR 89 | Temp 98.2°F | Resp 16

## 2024-01-03 DIAGNOSIS — M810 Age-related osteoporosis without current pathological fracture: Secondary | ICD-10-CM

## 2024-01-03 MED ORDER — DENOSUMAB 60 MG/ML ~~LOC~~ SOSY
60.0000 mg | PREFILLED_SYRINGE | Freq: Once | SUBCUTANEOUS | Status: AC
  Administered 2024-01-03: 14:00:00 60 mg via SUBCUTANEOUS

## 2024-01-03 NOTE — Progress Notes (Signed)
 Diagnosis: Osteoporosis  Provider:  Shona Salvo MD  Procedure: Injection  Prolia  (Denosumab ), Dose: 60 mg, Site: subcutaneous, Number of injections: 1  Injection Site(s): Right lower quad. abdomne  Post Care: Observation period completed  Discharge: Condition: Good, Destination: Home . AVS Declined  Performed by:  Baldwin Darice Helling, RN

## 2024-01-10 ENCOUNTER — Encounter (HOSPITAL_COMMUNITY)

## 2024-01-20 ENCOUNTER — Telehealth: Payer: Self-pay

## 2024-01-20 ENCOUNTER — Encounter: Payer: Self-pay | Admitting: Internal Medicine

## 2024-01-20 ENCOUNTER — Encounter: Payer: Self-pay | Admitting: Family Medicine

## 2024-01-20 NOTE — Telephone Encounter (Addendum)
 Auth Submission: NO AUTH NEEDED Site of care: Site of care: [facility] Payer: hta ppo Medication & CPT/J Code(s) submitted: Prolia  (Denosumab ) [id number] Diagnosis Code:  Route of submission (phone, fax, portal): phone Phone # Fax # Auth type: Buy/Bill PB Units/visits requested: 60mg  q55months x 2doses Reference number:  Approval from: 01/20/24 to 01/17/25

## 2024-01-27 ENCOUNTER — Encounter: Payer: Self-pay | Admitting: Family Medicine

## 2024-01-27 ENCOUNTER — Encounter: Payer: Self-pay | Attending: Internal Medicine | Admitting: *Deleted

## 2024-01-27 ENCOUNTER — Encounter: Payer: Self-pay | Admitting: Internal Medicine

## 2024-01-27 VITALS — BP 114/70 | HR 103 | Temp 98.2°F | Resp 18

## 2024-01-27 DIAGNOSIS — E785 Hyperlipidemia, unspecified: Secondary | ICD-10-CM | POA: Insufficient documentation

## 2024-01-27 MED ORDER — INCLISIRAN SODIUM 284 MG/1.5ML ~~LOC~~ SOSY
284.0000 mg | PREFILLED_SYRINGE | Freq: Once | SUBCUTANEOUS | Status: AC
  Administered 2024-01-27: 284 mg via SUBCUTANEOUS

## 2024-01-27 NOTE — Progress Notes (Signed)
 Diagnosis: Hyperlipidemia  Provider:  Hall, Zack MD  Procedure: Injection  Leqvio  (inclisiran), Dose: 284 mg, Site: subcutaneous, Number of injections: 1  Injection Site(s): Right arm  Post Care: Observation period completed  Discharge: Condition: Good, Destination: Home . AVS Declined  Performed by:  Baldwin Darice Helling, RN

## 2024-03-05 ENCOUNTER — Ambulatory Visit: Admitting: Internal Medicine

## 2024-04-16 ENCOUNTER — Ambulatory Visit (HOSPITAL_COMMUNITY)

## 2024-04-16 ENCOUNTER — Other Ambulatory Visit

## 2024-04-23 ENCOUNTER — Ambulatory Visit: Admitting: Hematology

## 2024-04-23 ENCOUNTER — Ambulatory Visit: Admitting: Physician Assistant

## 2024-07-04 ENCOUNTER — Ambulatory Visit

## 2024-07-26 ENCOUNTER — Ambulatory Visit
